# Patient Record
Sex: Female | Born: 1955
Health system: Southern US, Community
[De-identification: ages and names within clinical notes are randomized; demographics above are authoritative.]

## PROBLEM LIST (undated history)

## (undated) DIAGNOSIS — Z9109 Other allergy status, other than to drugs and biological substances: Secondary | ICD-10-CM

## (undated) DIAGNOSIS — S86011A Strain of right Achilles tendon, initial encounter: Secondary | ICD-10-CM

## (undated) DIAGNOSIS — I10 Essential (primary) hypertension: Secondary | ICD-10-CM

## (undated) DIAGNOSIS — E785 Hyperlipidemia, unspecified: Secondary | ICD-10-CM

## (undated) DIAGNOSIS — I480 Paroxysmal atrial fibrillation: Secondary | ICD-10-CM

## (undated) DIAGNOSIS — E119 Type 2 diabetes mellitus without complications: Secondary | ICD-10-CM

## (undated) DIAGNOSIS — H409 Unspecified glaucoma: Secondary | ICD-10-CM

## (undated) DIAGNOSIS — Z8619 Personal history of other infectious and parasitic diseases: Secondary | ICD-10-CM

## (undated) DIAGNOSIS — M8580 Other specified disorders of bone density and structure, unspecified site: Secondary | ICD-10-CM

## (undated) DIAGNOSIS — L247 Irritant contact dermatitis due to plants, except food: Secondary | ICD-10-CM

## (undated) HISTORY — PX: CHOLECYSTECTOMY: SHX55

## (undated) HISTORY — DX: Hyperlipidemia, unspecified: E78.5

## (undated) HISTORY — DX: Essential (primary) hypertension: I10

## (undated) HISTORY — DX: Other allergy status, other than to drugs and biological substances: Z91.09

## (undated) HISTORY — DX: Unspecified glaucoma: H40.9

## (undated) HISTORY — PX: ACHILLES TENDON REPAIR: SUR1153

## (undated) HISTORY — DX: Type 2 diabetes mellitus without complications: E11.9

## (undated) HISTORY — DX: Paroxysmal atrial fibrillation: I48.0

## (undated) HISTORY — DX: Personal history of other infectious and parasitic diseases: Z86.19

## (undated) HISTORY — PX: TUBAL LIGATION: SHX77

---

## 1997-04-05 HISTORY — PX: BREAST BIOPSY: SHX20

## 1997-08-11 ENCOUNTER — Emergency Department (HOSPITAL_COMMUNITY): Admission: EM | Admit: 1997-08-11 | Discharge: 1997-08-11 | Payer: Self-pay | Admitting: Emergency Medicine

## 2006-05-06 ENCOUNTER — Ambulatory Visit: Payer: Self-pay | Admitting: Internal Medicine

## 2006-07-21 ENCOUNTER — Ambulatory Visit: Payer: Self-pay

## 2006-07-22 ENCOUNTER — Ambulatory Visit: Payer: Self-pay

## 2006-07-29 ENCOUNTER — Ambulatory Visit: Payer: Self-pay

## 2007-07-27 ENCOUNTER — Ambulatory Visit: Payer: Self-pay | Admitting: Unknown Physician Specialty

## 2007-08-21 ENCOUNTER — Ambulatory Visit: Payer: Self-pay | Admitting: Gastroenterology

## 2007-12-26 ENCOUNTER — Ambulatory Visit: Payer: Self-pay | Admitting: Unknown Physician Specialty

## 2008-01-04 ENCOUNTER — Ambulatory Visit: Payer: Self-pay | Admitting: Unknown Physician Specialty

## 2008-10-02 ENCOUNTER — Ambulatory Visit: Payer: Self-pay | Admitting: Unknown Physician Specialty

## 2009-11-20 ENCOUNTER — Ambulatory Visit: Payer: Self-pay | Admitting: Unknown Physician Specialty

## 2010-11-23 ENCOUNTER — Ambulatory Visit: Payer: Self-pay

## 2011-02-12 ENCOUNTER — Other Ambulatory Visit: Payer: Self-pay | Admitting: Internal Medicine

## 2011-04-13 ENCOUNTER — Ambulatory Visit: Payer: Self-pay | Admitting: Internal Medicine

## 2011-05-07 ENCOUNTER — Ambulatory Visit: Payer: Self-pay | Admitting: Internal Medicine

## 2011-06-04 ENCOUNTER — Ambulatory Visit: Payer: Self-pay | Admitting: Internal Medicine

## 2011-07-23 ENCOUNTER — Other Ambulatory Visit: Payer: Self-pay | Admitting: Internal Medicine

## 2011-07-23 LAB — COMPREHENSIVE METABOLIC PANEL
Alkaline Phosphatase: 72 U/L (ref 50–136)
Anion Gap: 8 (ref 7–16)
Bilirubin,Total: 0.3 mg/dL (ref 0.2–1.0)
Calcium, Total: 8.5 mg/dL (ref 8.5–10.1)
Chloride: 105 mmol/L (ref 98–107)
Co2: 28 mmol/L (ref 21–32)
Creatinine: 0.92 mg/dL (ref 0.60–1.30)
EGFR (Non-African Amer.): >60
Glucose: 101 mg/dL — ABNORMAL HIGH (ref 65–99)
Osmolality: 282 (ref 275–301)
Potassium: 3.8 mmol/L (ref 3.5–5.1)
SGOT(AST): 32 U/L (ref 15–37)
SGPT (ALT): 39 U/L
Sodium: 141 mmol/L (ref 136–145)
Total Protein: 7.4 g/dL (ref 6.4–8.2)

## 2011-07-23 LAB — LIPID PANEL
Cholesterol: 158 mg/dL (ref 0–200)
HDL Cholesterol: 51 mg/dL (ref 40–60)
VLDL Cholesterol, Calc: 18 mg/dL (ref 5–40)

## 2011-10-26 LAB — HM PAP SMEAR

## 2011-11-04 ENCOUNTER — Ambulatory Visit: Payer: Self-pay | Admitting: Internal Medicine

## 2011-11-26 LAB — HM MAMMOGRAPHY

## 2011-11-30 ENCOUNTER — Ambulatory Visit: Payer: Self-pay | Admitting: Internal Medicine

## 2012-02-04 LAB — HM DIABETES EYE EXAM

## 2012-04-27 ENCOUNTER — Encounter: Payer: Self-pay | Admitting: Internal Medicine

## 2012-04-27 ENCOUNTER — Ambulatory Visit (INDEPENDENT_AMBULATORY_CARE_PROVIDER_SITE_OTHER): Payer: 59 | Admitting: Internal Medicine

## 2012-04-27 VITALS — BP 124/78 | HR 88 | Temp 98.3°F | Ht 62.0 in | Wt 191.5 lb

## 2012-04-27 DIAGNOSIS — E7800 Pure hypercholesterolemia, unspecified: Secondary | ICD-10-CM

## 2012-04-27 DIAGNOSIS — Z139 Encounter for screening, unspecified: Secondary | ICD-10-CM

## 2012-04-27 DIAGNOSIS — I10 Essential (primary) hypertension: Secondary | ICD-10-CM

## 2012-04-27 DIAGNOSIS — H409 Unspecified glaucoma: Secondary | ICD-10-CM

## 2012-04-27 MED ORDER — LISINOPRIL 10 MG PO TABS: 10.0000 mg | ORAL_TABLET | Freq: Every day | ORAL | Status: DC

## 2012-04-27 MED ORDER — TRIAMTERENE-HCTZ 37.5-25 MG PO CAPS: 1.0000 | ORAL_CAPSULE | ORAL | Status: DC

## 2012-04-27 MED ORDER — AMLODIPINE BESYLATE 5 MG PO TABS: 5.0000 mg | ORAL_TABLET | Freq: Every day | ORAL | Status: DC

## 2012-04-28 ENCOUNTER — Encounter: Payer: Self-pay | Admitting: Internal Medicine

## 2012-04-28 DIAGNOSIS — I152 Hypertension secondary to endocrine disorders: Secondary | ICD-10-CM | POA: Insufficient documentation

## 2012-04-28 DIAGNOSIS — H409 Unspecified glaucoma: Secondary | ICD-10-CM | POA: Insufficient documentation

## 2012-04-28 DIAGNOSIS — E119 Type 2 diabetes mellitus without complications: Secondary | ICD-10-CM | POA: Insufficient documentation

## 2012-04-28 DIAGNOSIS — I10 Essential (primary) hypertension: Secondary | ICD-10-CM | POA: Insufficient documentation

## 2012-04-28 NOTE — Progress Notes (Signed)
  Subjective:    [name], female    DOB: Oct 21, 1955, 57 y.o.   MRN: [id number]  HPI 57 year old female with past history of hypertension and diabetes who comes in today to follow up on these issues as well as to establish care.  She works at the hospital.  States she is doing well.  No chest pain or tightness.  Not exercising regularly, but she does state she walks one mile on Saturday (with her sister).  Takes metformin.  Checks her sugar regularly.  AM sugars averaging 120-140 and PM sugars 140-150s.  Last a1c 6.2.  Has been to lifestyles.  Still follows up with Raynelle Fanning.  LMP 02/2008.    Past Medical History  Diagnosis Date  . Diabetes mellitus without complication   . Glaucoma   . Environmental allergies   . Hypertension   . History of chicken pox     Current Outpatient Prescriptions on File Prior to Visit  Medication Sig Dispense Refill  . metFORMIN (GLUCOPHAGE) 500 MG tablet Take 500 mg by mouth daily.      Marland Kitchen triamterene-hydrochlorothiazide (DYAZIDE) 37.5-25 MG per capsule Take 1 each (1 capsule total) by mouth every morning.  30 capsule  3  . lisinopril (PRINIVIL,ZESTRIL) 10 MG tablet Take 1 tablet (10 mg total) by mouth daily.  30 tablet  3    Review of Systems Patient denies any headache, lightheadedness or dizziness.  No sinus or allergy symptoms.  No chest pain, tightness or palpitations.  No increased shortness of breath, cough or congestion.  No nausea or vomiting.  No acid reflux.  No abdominal pain or cramping.  No bowel change, such as diarrhea, constipation, BRBPR or melana.  No urine change.  No vaginal problems.  LMP 02/2008.  No spotting or bleeding since.  Plans to get more serious about her exercise.       Objective:   Physical Exam  Blood pressure recheck:  128/78, pulse 70  57 year old female in no acute distress.   HEENT:  Nares- clear.  Oropharynx - without lesions. NECK:  Supple.  Nontender.  No audible bruit.  HEART:  Appears to be  regular. LUNGS:  No crackles or wheezing audible.  Respirations even and unlabored.  RADIAL PULSE:  Equal bilaterally.  ABDOMEN:  Soft, nontender.  Bowel sounds present and normal.  No audible abdominal bruit.   EXTREMITIES:  No increased edema present.  DP pulses palpable and equal bilaterally.       SKIN:  Feet without lesions.      Assessment & Plan:  FAMILY HISTORY OF COLON POLYPS.  Mother has polyps.  Her last colonoscopy 2009.  Due follow up colonoscopy this year.  Make referral.    HEALTH MAINTENANCE.  States she had her physical 7/13.  Mammogram 8/13 - ok.  Due colonoscopy.  Last 2009.

## 2012-04-28 NOTE — Assessment & Plan Note (Signed)
Sees opthalmology.  Stable on current regimen.  Last evaluated 11/13.

## 2012-04-28 NOTE — Assessment & Plan Note (Signed)
On metformin.  Sugars as outlined.  Last a1c 6.2. Continues to follow up with Raynelle Fanning.  Exercise.  Follow.  Start ACE inhibitor as outline.  Eye exam 11/13.

## 2012-04-28 NOTE — Assessment & Plan Note (Signed)
Blood pressure is doing well.  Since she is diabetic, would like to get her on an ACE inhibitor.  Will decrease her amlodipine to 5mg  q day and start lisinopril 10mg  q day.  Follow pressures.  Send in readings.  Get her back in soon to reassess.  Check met b a couple of weeks after she starts the ACE inhibitor.

## 2012-05-21 ENCOUNTER — Encounter: Payer: Self-pay | Admitting: Internal Medicine

## 2012-06-22 ENCOUNTER — Telehealth: Payer: Self-pay | Admitting: Internal Medicine

## 2012-06-22 ENCOUNTER — Other Ambulatory Visit: Payer: Self-pay | Admitting: Internal Medicine

## 2012-06-22 LAB — BASIC METABOLIC PANEL
Anion Gap: 8 (ref 7–16)
BUN: 18 mg/dL (ref 7–18)
Calcium, Total: 8.8 mg/dL (ref 8.5–10.1)
Chloride: 102 mmol/L (ref 98–107)
Co2: 28 mmol/L (ref 21–32)
Creatinine: 0.85 mg/dL (ref 0.60–1.30)
Sodium: 138 mmol/L (ref 136–145)

## 2012-06-22 NOTE — Telephone Encounter (Signed)
noted 

## 2012-06-22 NOTE — Telephone Encounter (Signed)
[facility] labs in box °

## 2012-06-23 ENCOUNTER — Ambulatory Visit: Payer: 59 | Admitting: Internal Medicine

## 2012-06-26 ENCOUNTER — Encounter: Payer: Self-pay | Admitting: Internal Medicine

## 2012-06-26 ENCOUNTER — Ambulatory Visit (INDEPENDENT_AMBULATORY_CARE_PROVIDER_SITE_OTHER): Payer: 59 | Admitting: Internal Medicine

## 2012-06-26 VITALS — BP 102/60 | HR 76 | Temp 98.8°F | Ht 62.0 in | Wt 187.8 lb

## 2012-06-26 DIAGNOSIS — H409 Unspecified glaucoma: Secondary | ICD-10-CM

## 2012-06-26 DIAGNOSIS — E119 Type 2 diabetes mellitus without complications: Secondary | ICD-10-CM

## 2012-06-26 DIAGNOSIS — I10 Essential (primary) hypertension: Secondary | ICD-10-CM

## 2012-06-26 NOTE — Assessment & Plan Note (Signed)
On metformin.  Sugars improved as outlined.  Last a1c 6.2. Continues to follow up with Raynelle Fanning.  Exercise.  Follow.   Eye exam 11/13.

## 2012-06-26 NOTE — Progress Notes (Signed)
  Subjective:    [name], female    DOB: 05/28/1955, 57 y.o.   MRN: [id number]  HPI 57 year old female with past history of hypertension and diabetes who comes in today for a scheduled follow up.  States she is doing well.  No chest pain or tightness.   Takes metformin.  Checks her sugar regularly.  AM sugars averaging in the 90s and PM sugars 120.   Last a1c 6.2.  Has been to lifestyles.  Still follows up with Raynelle Fanning.  She has lost weight.  Feels good.  Last visit, I decreased her amlodipine to 5mg  q day and started lisinopril 10mg  q day.  Recent met b wnl.  Blood pressure averaging 120s/70s.    Past Medical History  Diagnosis Date  . Diabetes mellitus without complication   . Glaucoma   . Environmental allergies   . Hypertension   . History of chicken pox     Current Outpatient Prescriptions on File Prior to Visit  Medication Sig Dispense Refill  . amLODipine (NORVASC) 5 MG tablet Take 1 tablet (5 mg total) by mouth daily.  30 tablet  3  . bimatoprost (LUMIGAN) 0.01 % SOLN 1 drop at bedtime.      Marland Kitchen glucose blood test strip Use as instructed      . lisinopril (PRINIVIL,ZESTRIL) 10 MG tablet Take 1 tablet (10 mg total) by mouth daily.  30 tablet  3  . metFORMIN (GLUCOPHAGE) 500 MG tablet Take 500 mg by mouth daily.      . timolol (BETIMOL) 0.25 % ophthalmic solution 1-2 drops 2 (two) times daily.      Marland Kitchen triamterene-hydrochlorothiazide (DYAZIDE) 37.5-25 MG per capsule Take 1 each (1 capsule total) by mouth every morning.  30 capsule  3   No current facility-administered medications on file prior to visit.    Review of Systems Patient denies any headache, lightheadedness or dizziness.  No sinus or allergy symptoms.  No chest pain, tightness or palpitations.  No increased shortness of breath, cough or congestion.  No nausea or vomiting.  No acid reflux.  No abdominal pain or cramping.  No bowel change, such as diarrhea, constipation, BRBPR or melana.  No urine change.         Objective:   Physical Exam  Filed Vitals:   06/26/12 1637  BP: 102/60  Pulse: 76  Temp: 98.8 F (37.1 C)     Blood pressure recheck:  46/56  57 year old female in no acute distress.   HEENT:  Nares- clear.  Oropharynx - without lesions. NECK:  Supple.  Nontender.  No audible bruit.  HEART:  Appears to be regular. LUNGS:  No crackles or wheezing audible.  Respirations even and unlabored.  RADIAL PULSE:  Equal bilaterally.  ABDOMEN:  Soft, nontender.  Bowel sounds present and normal.  No audible abdominal bruit.   EXTREMITIES:  No increased edema present.      Assessment & Plan:  FAMILY HISTORY OF COLON POLYPS.  Mother has polyps.  Her last colonoscopy 2009.  Due follow up colonoscopy this year.  Has her appt scheduled with Dr Markham Jordan.     HEALTH MAINTENANCE.  States she had her physical 7/13.  Mammogram 8/13 - ok.  Due colonoscopy.  Last 2009.  Schedule her for a physical next visit.

## 2012-06-26 NOTE — Assessment & Plan Note (Signed)
Sees opthalmology.  Stable on current regimen.  Last evaluated 11/13.

## 2012-06-26 NOTE — Assessment & Plan Note (Signed)
Blood pressure is doing well on amlodipine 5mg  q day and lisinopril 10mg  q day.  Recent met b within normal limits.  Follow.

## 2012-07-11 ENCOUNTER — Encounter: Payer: Self-pay | Admitting: Internal Medicine

## 2012-08-18 ENCOUNTER — Ambulatory Visit: Payer: Self-pay | Admitting: Unknown Physician Specialty

## 2012-08-21 LAB — PATHOLOGY REPORT

## 2012-09-07 ENCOUNTER — Other Ambulatory Visit: Payer: Self-pay | Admitting: *Deleted

## 2012-09-07 MED ORDER — LISINOPRIL 10 MG PO TABS: 10.0000 mg | ORAL_TABLET | Freq: Every day | ORAL | Status: DC

## 2012-09-07 MED ORDER — AMLODIPINE BESYLATE 5 MG PO TABS: 5.0000 mg | ORAL_TABLET | Freq: Every day | ORAL | Status: DC

## 2012-09-07 MED ORDER — TRIAMTERENE-HCTZ 37.5-25 MG PO CAPS: 1.0000 | ORAL_CAPSULE | ORAL | Status: DC

## 2012-09-21 ENCOUNTER — Other Ambulatory Visit: Payer: Self-pay | Admitting: *Deleted

## 2012-09-21 MED ORDER — METFORMIN HCL 500 MG PO TABS: 500.0000 mg | ORAL_TABLET | Freq: Every day | ORAL | Status: DC

## 2012-09-22 LAB — HEMOGLOBIN A1C: Hgb A1c MFr Bld: 6.2 % — AB (ref 4.0–6.0)

## 2012-09-29 ENCOUNTER — Telehealth: Payer: Self-pay | Admitting: *Deleted

## 2012-09-29 NOTE — Telephone Encounter (Signed)
Left detailed message (per DPR) to notify pt that her Hemaglobin A1C drawn on 09/22/12 @ [facility] was normal (6.2)

## 2012-10-26 ENCOUNTER — Encounter: Payer: Self-pay | Admitting: Internal Medicine

## 2012-10-26 ENCOUNTER — Ambulatory Visit (INDEPENDENT_AMBULATORY_CARE_PROVIDER_SITE_OTHER): Payer: 59 | Admitting: Internal Medicine

## 2012-10-26 VITALS — BP 110/70 | HR 85 | Temp 98.3°F | Ht 61.75 in | Wt 190.5 lb

## 2012-10-26 DIAGNOSIS — E119 Type 2 diabetes mellitus without complications: Secondary | ICD-10-CM

## 2012-10-26 DIAGNOSIS — I10 Essential (primary) hypertension: Secondary | ICD-10-CM

## 2012-10-26 DIAGNOSIS — H409 Unspecified glaucoma: Secondary | ICD-10-CM

## 2012-10-26 LAB — HM DIABETES FOOT EXAM

## 2012-10-29 ENCOUNTER — Encounter: Payer: Self-pay | Admitting: Internal Medicine

## 2012-10-29 NOTE — Progress Notes (Signed)
  Subjective:    Patient ID: YOSELIN AMERMAN, female    DOB: 01-Aug-1955, 57 y.o.   MRN: [id number]  HPI 57 year old female with past history of hypertension and diabetes who comes in today to follow up on these issues as well as for a complete physical exam.  States she is doing well.  No chest pain or tightness.   Takes metformin.  Checks her sugar regularly.  AM sugars averaging in the 113-120s and PM sugars 130-140.  Last a1c 6.2.  Has been to lifestyles.  Still follows up with Raynelle Fanning.  Feels good.     Past Medical History  Diagnosis Date  . Diabetes mellitus without complication   . Glaucoma   . Environmental allergies   . Hypertension   . History of chicken pox     Current Outpatient Prescriptions on File Prior to Visit  Medication Sig Dispense Refill  . amLODipine (NORVASC) 5 MG tablet Take 1 tablet (5 mg total) by mouth daily.  30 tablet  5  . bimatoprost (LUMIGAN) 0.01 % SOLN 1 drop at bedtime.      Marland Kitchen glucose blood test strip Use as instructed      . lisinopril (PRINIVIL,ZESTRIL) 10 MG tablet Take 1 tablet (10 mg total) by mouth daily.  30 tablet  5  . metFORMIN (GLUCOPHAGE) 500 MG tablet Take 1 tablet (500 mg total) by mouth daily.  30 tablet  5  . timolol (BETIMOL) 0.25 % ophthalmic solution 1-2 drops 2 (two) times daily.      Marland Kitchen triamterene-hydrochlorothiazide (DYAZIDE) 37.5-25 MG per capsule Take 1 each (1 capsule total) by mouth every morning.  30 capsule  5   No current facility-administered medications on file prior to visit.    Review of Systems Patient denies any headache, lightheadedness or dizziness.  No sinus or allergy symptoms.  No chest pain, tightness or palpitations.  No increased shortness of breath, cough or congestion.  No nausea or vomiting.  No acid reflux.  No abdominal pain or cramping.  No bowel change, such as diarrhea, constipation, BRBPR or melana.  No urine change.  Sugars as outlined.       Objective:   Physical Exam  Filed Vitals:   10/26/12  1541  BP: 110/70  Pulse: 85  Temp: 98.3 F (36.8 C)   Blood pressure recheck:  114/72, pulse 77  57 year old female in no acute distress.   HEENT:  Nares- clear.  Oropharynx - without lesions. NECK:  Supple.  Nontender.  No audible bruit.  HEART:  Appears to be regular. LUNGS:  No crackles or wheezing audible.  Respirations even and unlabored.  RADIAL PULSE:  Equal bilaterally.    BREASTS:  No nipple discharge or nipple retraction present.  Could not appreciate any distinct nodules or axillary adenopathy.  ABDOMEN:  Soft, nontender.  Bowel sounds present and normal.  No audible abdominal bruit.  GU:  She declined.     EXTREMITIES:  No increased edema present.  DP pulses palpable and equal bilaterally.      FEET:  No lesions.       Assessment & Plan:  FAMILY HISTORY OF COLON POLYPS.  Mother has polyps.  Her last colonoscopy 08/18/12 revealed a polyp in teh cecum, diverticulosis and internal hemorrhoids.   Due follow up colonoscopy 2019.      HEALTH MAINTENANCE.  Physical today.  Mammogram 8/13 - ok. Scheduled a f/u mammogram in 8/14.

## 2012-10-29 NOTE — Assessment & Plan Note (Signed)
On metformin.  Sugars improved as outlined.  Last a1c 6.2. Continues to follow up with Raynelle Fanning.  Exercise.  Follow.   Eye exam 11/13.

## 2012-10-29 NOTE — Assessment & Plan Note (Signed)
Blood pressure is doing well on amlodipine 5mg  q day and lisinopril 10mg  q day.  Recent met b within normal limits.  Follow.

## 2012-10-29 NOTE — Assessment & Plan Note (Signed)
Sees opthalmology.  Stable on current regimen.  Last evaluated 11/13.

## 2012-11-07 ENCOUNTER — Ambulatory Visit: Payer: Self-pay | Admitting: Internal Medicine

## 2012-11-08 ENCOUNTER — Encounter: Payer: Self-pay | Admitting: *Deleted

## 2012-11-16 ENCOUNTER — Encounter: Payer: Self-pay | Admitting: *Deleted

## 2012-11-20 ENCOUNTER — Encounter: Payer: Self-pay | Admitting: Internal Medicine

## 2012-11-20 ENCOUNTER — Telehealth: Payer: Self-pay | Admitting: Internal Medicine

## 2012-11-20 MED ORDER — GLUCOSE BLOOD VI STRP: ORAL_STRIP | Status: DC

## 2012-11-20 NOTE — Telephone Encounter (Signed)
done

## 2012-11-20 NOTE — Telephone Encounter (Signed)
Pt is needing refill on Contour next strips pt uses [facility] pharmacy.

## 2012-12-26 ENCOUNTER — Other Ambulatory Visit: Payer: Self-pay | Admitting: Internal Medicine

## 2012-12-26 LAB — CBC WITH DIFFERENTIAL/PLATELET
Basophil #: 0.1 10*3/uL (ref 0.0–0.1)
Basophil %: 1 %
Eosinophil #: 0.2 10*3/uL (ref 0.0–0.7)
Eosinophil %: 3.2 %
HCT: 40.9 % (ref 35.0–47.0)
Lymphocyte #: 2.4 10*3/uL (ref 1.0–3.6)
Lymphocyte %: 36.8 %
MCHC: 34.9 g/dL (ref 32.0–36.0)
MCV: 89 fL (ref 80–100)
Monocyte #: 0.7 x10 3/mm (ref 0.2–0.9)
Neutrophil %: 49 %
Platelet: 246 10*3/uL (ref 150–440)
RBC: 4.59 10*6/uL (ref 3.80–5.20)
RDW: 13.4 % (ref 11.5–14.5)
WBC: 6.5 10*3/uL (ref 3.6–11.0)

## 2012-12-26 LAB — LIPID PANEL
Cholesterol: 150 mg/dL (ref 0–200)
Ldl Cholesterol, Calc: 84 mg/dL (ref 0–100)
Triglycerides: 132 mg/dL (ref 0–200)
VLDL Cholesterol, Calc: 26 mg/dL (ref 5–40)

## 2012-12-26 LAB — COMPREHENSIVE METABOLIC PANEL
Alkaline Phosphatase: 100 U/L (ref 50–136)
BUN: 14 mg/dL (ref 7–18)
Calcium, Total: 8.6 mg/dL (ref 8.5–10.1)
Chloride: 108 mmol/L — ABNORMAL HIGH (ref 98–107)
Co2: 26 mmol/L (ref 21–32)
Creatinine: 1.06 mg/dL (ref 0.60–1.30)
Glucose: 128 mg/dL — ABNORMAL HIGH (ref 65–99)
Osmolality: 280 (ref 275–301)
Potassium: 3.9 mmol/L (ref 3.5–5.1)
SGPT (ALT): 41 U/L (ref 12–78)
Sodium: 139 mmol/L (ref 136–145)
Total Protein: 7.2 g/dL (ref 6.4–8.2)

## 2012-12-26 LAB — TSH: Thyroid Stimulating Horm: 0.531 u[IU]/mL

## 2013-02-06 ENCOUNTER — Encounter: Payer: Self-pay | Admitting: Internal Medicine

## 2013-03-02 ENCOUNTER — Encounter: Payer: Self-pay | Admitting: Internal Medicine

## 2013-03-02 ENCOUNTER — Encounter (INDEPENDENT_AMBULATORY_CARE_PROVIDER_SITE_OTHER): Payer: Self-pay

## 2013-03-02 ENCOUNTER — Ambulatory Visit (INDEPENDENT_AMBULATORY_CARE_PROVIDER_SITE_OTHER): Payer: 59 | Admitting: Internal Medicine

## 2013-03-02 VITALS — BP 110/70 | HR 76 | Temp 98.3°F | Ht 61.75 in | Wt 192.8 lb

## 2013-03-02 DIAGNOSIS — I10 Essential (primary) hypertension: Secondary | ICD-10-CM

## 2013-03-02 DIAGNOSIS — H409 Unspecified glaucoma: Secondary | ICD-10-CM

## 2013-03-02 DIAGNOSIS — E119 Type 2 diabetes mellitus without complications: Secondary | ICD-10-CM

## 2013-03-02 MED ORDER — METFORMIN HCL 500 MG PO TABS: 500.0000 mg | ORAL_TABLET | Freq: Every day | ORAL | Status: DC

## 2013-03-02 NOTE — Assessment & Plan Note (Addendum)
Sees opthalmology.  Stable on current regimen.  Last evaluated 11/13.

## 2013-03-02 NOTE — Assessment & Plan Note (Addendum)
On metformin.  Sugars improved as outlined.  Last a1c 6.3. Continues to follow up with Raynelle Fanning.  Exercise.  Follow.   Eye exam 11/13.

## 2013-03-02 NOTE — Assessment & Plan Note (Addendum)
Blood pressure is doing well on amlodipine 5mg q day and lisinopril 10mg q day.  Last met b within normal limits.  Follow.   

## 2013-03-02 NOTE — Progress Notes (Signed)
Pre-visit discussion using our clinic review tool. No additional management support is needed unless otherwise documented below in the visit note.  

## 2013-03-04 ENCOUNTER — Encounter: Payer: Self-pay | Admitting: Internal Medicine

## 2013-03-04 ENCOUNTER — Telehealth: Payer: Self-pay | Admitting: Internal Medicine

## 2013-03-04 NOTE — Telephone Encounter (Signed)
Needs a f/u appt in 4 months.  Needs late pm appt.  Thanks.

## 2013-03-04 NOTE — Progress Notes (Signed)
  Subjective:    [name], female    DOB: Feb 29, 1956, 57 y.o.   MRN: [id number]  HPI 57 year old female with past history of hypertension and diabetes who comes in today for a scheduled follow up.  States she is doing well.  No chest pain or tightness.   Takes metformin.  Checks her sugar regularly.  AM sugars averaging in the 111-120s and PM sugars 130-140.  Last a1c 6.2.  Has been to lifestyles.  Still follows up with Raynelle Fanning.  Feels good.  trying to watch what she eats.      Past Medical History  Diagnosis Date  . Diabetes mellitus without complication   . Glaucoma   . Environmental allergies   . Hypertension   . History of chicken pox     Current Outpatient Prescriptions on File Prior to Visit  Medication Sig Dispense Refill  . amLODipine (NORVASC) 5 MG tablet Take 1 tablet (5 mg total) by mouth daily.  30 tablet  5  . bimatoprost (LUMIGAN) 0.01 % SOLN 1 drop at bedtime.      Marland Kitchen glucose blood test strip Use as instructed  100 each  11  . lisinopril (PRINIVIL,ZESTRIL) 10 MG tablet Take 1 tablet (10 mg total) by mouth daily.  30 tablet  5  . timolol (BETIMOL) 0.25 % ophthalmic solution 1-2 drops 2 (two) times daily.      Marland Kitchen triamterene-hydrochlorothiazide (DYAZIDE) 37.5-25 MG per capsule Take 1 each (1 capsule total) by mouth every morning.  30 capsule  5   No current facility-administered medications on file prior to visit.    Review of Systems Patient denies any headache, lightheadedness or dizziness.  No sinus or allergy symptoms.  No chest pain, tightness or palpitations.  No increased shortness of breath, cough or congestion.  No nausea or vomiting.  No acid reflux.  No abdominal pain or cramping.  No bowel change, such as diarrhea, constipation, BRBPR or melana.  No urine change.  Sugars as outlined.       Objective:   Physical Exam  Filed Vitals:   03/02/13 1533  BP: 110/70  Pulse: 76  Temp: 98.3 F (36.8 C)   Blood pressure recheck:  120/74, pulse  57  57 year old female in no acute distress.   HEENT:  Nares- clear.  Oropharynx - without lesions. NECK:  Supple.  Nontender.  No audible bruit.  HEART:  Appears to be regular. LUNGS:  No crackles or wheezing audible.  Respirations even and unlabored.  RADIAL PULSE:  Equal bilaterally. ABDOMEN:  Soft, nontender.  Bowel sounds present and normal.  No audible abdominal bruit.     EXTREMITIES:  No increased edema present.  DP pulses palpable and equal bilaterally.      FEET:  No lesions.       Assessment & Plan:  FAMILY HISTORY OF COLON POLYPS.  Mother has polyps.  Her last colonoscopy 08/18/12 revealed a polyp in the cecum, diverticulosis and internal hemorrhoids.   Due follow up colonoscopy 2019.      HEALTH MAINTENANCE.  Physical last visit.  Mammogram 11/07/12 - Birads II.  Colonoscopy as outlined.

## 2013-03-05 NOTE — Telephone Encounter (Signed)
Left message for pt to call office

## 2013-03-05 NOTE — Telephone Encounter (Signed)
Pt returned call.  Pt wanted to make 6 month appt since she is doing well.  Appt scheduled 09/03/13.

## 2013-03-19 ENCOUNTER — Other Ambulatory Visit: Payer: Self-pay | Admitting: Internal Medicine

## 2013-03-19 LAB — BASIC METABOLIC PANEL
Anion Gap: 4 — ABNORMAL LOW (ref 7–16)
BUN: 16 mg/dL (ref 7–18)
Calcium, Total: 8.8 mg/dL (ref 8.5–10.1)
Co2: 29 mmol/L (ref 21–32)
Creatinine: 0.87 mg/dL (ref 0.60–1.30)
EGFR (Non-African Amer.): >60
Potassium: 4 mmol/L (ref 3.5–5.1)

## 2013-03-27 ENCOUNTER — Encounter: Payer: Self-pay | Admitting: *Deleted

## 2013-03-30 NOTE — Telephone Encounter (Signed)
Mailed unread message to pt  

## 2013-05-09 ENCOUNTER — Encounter: Payer: Self-pay | Admitting: Internal Medicine

## 2013-06-18 ENCOUNTER — Ambulatory Visit: Payer: Self-pay | Admitting: Internal Medicine

## 2013-07-04 ENCOUNTER — Ambulatory Visit: Payer: Self-pay | Admitting: Internal Medicine

## 2013-08-02 ENCOUNTER — Other Ambulatory Visit: Payer: Self-pay | Admitting: *Deleted

## 2013-08-02 MED ORDER — METFORMIN HCL 500 MG PO TABS: 500.0000 mg | ORAL_TABLET | Freq: Every day | ORAL | Status: DC

## 2013-08-31 ENCOUNTER — Other Ambulatory Visit: Payer: Self-pay | Admitting: Internal Medicine

## 2013-08-31 NOTE — Telephone Encounter (Signed)
Appt 09/03/13

## 2013-09-03 ENCOUNTER — Encounter: Payer: Self-pay | Admitting: Internal Medicine

## 2013-09-03 ENCOUNTER — Ambulatory Visit (INDEPENDENT_AMBULATORY_CARE_PROVIDER_SITE_OTHER): Payer: BC Managed Care – PPO | Admitting: Internal Medicine

## 2013-09-03 VITALS — BP 110/70 | HR 73 | Temp 98.6°F | Ht 61.75 in | Wt 181.5 lb

## 2013-09-03 DIAGNOSIS — E669 Obesity, unspecified: Secondary | ICD-10-CM

## 2013-09-03 DIAGNOSIS — Z13220 Encounter for screening for lipoid disorders: Secondary | ICD-10-CM

## 2013-09-03 DIAGNOSIS — H409 Unspecified glaucoma: Secondary | ICD-10-CM

## 2013-09-03 DIAGNOSIS — E119 Type 2 diabetes mellitus without complications: Secondary | ICD-10-CM

## 2013-09-03 DIAGNOSIS — I10 Essential (primary) hypertension: Secondary | ICD-10-CM

## 2013-09-03 NOTE — Progress Notes (Signed)
Pre visit review using our clinic review tool, if applicable. No additional management support is needed unless otherwise documented below in the visit note. 

## 2013-09-09 ENCOUNTER — Encounter: Payer: Self-pay | Admitting: Internal Medicine

## 2013-09-09 DIAGNOSIS — E669 Obesity, unspecified: Secondary | ICD-10-CM | POA: Insufficient documentation

## 2013-09-09 NOTE — Assessment & Plan Note (Signed)
On metformin.  Sugars doing well as outlined.  Last a1c 6.2. Continues to follow up with Almyra Free.  Exercise.  Follow.   Eye exam planned as outlined.

## 2013-09-09 NOTE — Assessment & Plan Note (Signed)
Sees opthalmology.  Stable on current regimen.  Due to f/u with Dr Dorthula Nettles 10/29/13.

## 2013-09-09 NOTE — Assessment & Plan Note (Signed)
BMI is 32.  Pt working on diet and exercise.  Follow.

## 2013-09-09 NOTE — Assessment & Plan Note (Signed)
Blood pressure is doing well on amlodipine 5mg q day and lisinopril 10mg q day.  Last met b within normal limits.  Follow.   

## 2013-09-09 NOTE — Progress Notes (Signed)
  Subjective:    [name], female    DOB: 04/18/1955, 58 y.o.   MRN: [id number]  HPI 58 year old female with past history of hypertension and diabetes who comes in today for a scheduled follow up.  States she is doing well.  No chest pain or tightness.   Takes metformin.  Checks her sugar regularly.  AM sugars averaging in the 103-119.   Has been to lifestyles.  Still follows up with Almyra Free.  Feels good.  Trying to watch what she eats.  Has retired.  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  Planning to f/u with Dr Dorthula Nettles at [facility] 10/29/13.  Has glaucoma.  Is followed regularly.  Last a1c 3/15 - 6.2.      Past Medical History  Diagnosis Date  . Diabetes mellitus without complication   . Glaucoma   . Environmental allergies   . Hypertension   . History of chicken pox     Current Outpatient Prescriptions on File Prior to Visit  Medication Sig Dispense Refill  . amLODipine (NORVASC) 5 MG tablet Take 1 tablet by mouth daily.  90 tablet  1  . bimatoprost (LUMIGAN) 0.01 % SOLN 1 drop at bedtime.      Marland Kitchen glucose blood test strip Use as instructed  100 each  11  . lisinopril (PRINIVIL,ZESTRIL) 10 MG tablet Take 1 tablet by mouth daily.  90 tablet  1  . metFORMIN (GLUCOPHAGE) 500 MG tablet TAKE ONE TABLET BY MOUTH EVERY DAY  30 tablet  0  . timolol (BETIMOL) 0.25 % ophthalmic solution 1-2 drops 2 (two) times daily.      Marland Kitchen triamterene-hydrochlorothiazide (DYAZIDE) 37.5-25 MG per capsule TAKE ONE CAPSULE BY MOUTH EVERY MORNING  90 capsule  1   No current facility-administered medications on file prior to visit.    Review of Systems Patient denies any headache, lightheadedness or dizziness.  No sinus or allergy symptoms.  No chest pain, tightness or palpitations.  No increased shortness of breath, cough or congestion.  No nausea or vomiting.  No acid reflux.  No abdominal pain or cramping.  No bowel change, such as diarrhea, constipation, BRBPR or melana.  No urine change.   Sugars as outlined.  Has retired.  Stress better.       Objective:   Physical Exam  Filed Vitals:   09/03/13 1604  BP: 110/70  Pulse: 73  Temp: 98.6 F (37 C)   Blood pressure recheck:  9/74  58 year old female in no acute distress.   HEENT:  Nares- clear.  Oropharynx - without lesions. NECK:  Supple.  Nontender.  No audible bruit.  HEART:  Appears to be regular. LUNGS:  No crackles or wheezing audible.  Respirations even and unlabored.  RADIAL PULSE:  Equal bilaterally. ABDOMEN:  Soft, nontender.  Bowel sounds present and normal.  No audible abdominal bruit.     EXTREMITIES:  No increased edema present.  DP pulses palpable and equal bilaterally.      FEET:  No lesions.       Assessment & Plan:  FAMILY HISTORY OF COLON POLYPS.  Mother has polyps.  Her last colonoscopy 08/18/12 revealed a polyp in the cecum, diverticulosis and internal hemorrhoids.   Due follow up colonoscopy 2019.      HEALTH MAINTENANCE.  Physical 10/26/12.  Mammogram 11/07/12 - Birads II.  Colonoscopy as outlined.  Due f/u 2019.

## 2013-10-02 ENCOUNTER — Other Ambulatory Visit: Payer: Self-pay | Admitting: Internal Medicine

## 2013-10-02 ENCOUNTER — Other Ambulatory Visit (INDEPENDENT_AMBULATORY_CARE_PROVIDER_SITE_OTHER): Payer: BC Managed Care – PPO

## 2013-10-02 ENCOUNTER — Other Ambulatory Visit: Payer: Self-pay | Admitting: *Deleted

## 2013-10-02 DIAGNOSIS — Z13220 Encounter for screening for lipoid disorders: Secondary | ICD-10-CM

## 2013-10-02 DIAGNOSIS — E119 Type 2 diabetes mellitus without complications: Secondary | ICD-10-CM

## 2013-10-02 LAB — CBC WITH DIFFERENTIAL/PLATELET
Basophils Absolute: 0 10*3/uL (ref 0.0–0.1)
Basophils Relative: 0.6 % (ref 0.0–3.0)
EOS PCT: 2.9 % (ref 0.0–5.0)
Eosinophils Absolute: 0.2 10*3/uL (ref 0.0–0.7)
HEMATOCRIT: 39.5 % (ref 36.0–46.0)
Hemoglobin: 13.5 g/dL (ref 12.0–15.0)
LYMPHS ABS: 2.2 10*3/uL (ref 0.7–4.0)
Lymphocytes Relative: 37 % (ref 12.0–46.0)
MCHC: 34.2 g/dL (ref 30.0–36.0)
MCV: 90.3 fl (ref 78.0–100.0)
MONO ABS: 0.6 10*3/uL (ref 0.1–1.0)
Monocytes Relative: 10 % (ref 3.0–12.0)
Neutro Abs: 2.9 10*3/uL (ref 1.4–7.7)
Neutrophils Relative %: 49.5 % (ref 43.0–77.0)
Platelets: 221 10*3/uL (ref 150.0–400.0)
RBC: 4.38 Mil/uL (ref 3.87–5.11)
RDW: 13.4 % (ref 11.5–15.5)
WBC: 5.9 10*3/uL (ref 4.0–10.5)

## 2013-10-02 LAB — LIPID PANEL
CHOL/HDL RATIO: 4
Cholesterol: 173 mg/dL (ref 0–200)
HDL: 39.5 mg/dL (ref >39.00)
LDL CALC: 93 mg/dL (ref 0–99)
NONHDL: 133.5
Triglycerides: 201 mg/dL — ABNORMAL HIGH (ref 0.0–149.0)
VLDL: 40.2 mg/dL — ABNORMAL HIGH (ref 0.0–40.0)

## 2013-10-02 LAB — COMPREHENSIVE METABOLIC PANEL
ALK PHOS: 63 U/L (ref 39–117)
ALT: 27 U/L (ref 0–35)
AST: 25 U/L (ref 0–37)
Albumin: 4.1 g/dL (ref 3.5–5.2)
BILIRUBIN TOTAL: 0.9 mg/dL (ref 0.2–1.2)
BUN: 16 mg/dL (ref 6–23)
CO2: 28 mEq/L (ref 19–32)
Calcium: 9 mg/dL (ref 8.4–10.5)
Chloride: 104 mEq/L (ref 96–112)
Creatinine, Ser: 0.7 mg/dL (ref 0.4–1.2)
GFR: 85.69 mL/min (ref >60.00)
Glucose, Bld: 124 mg/dL — ABNORMAL HIGH (ref 70–99)
Potassium: 4 mEq/L (ref 3.5–5.1)
SODIUM: 139 meq/L (ref 135–145)
TOTAL PROTEIN: 6.7 g/dL (ref 6.0–8.3)

## 2013-10-02 LAB — TSH: TSH: 0.45 u[IU]/mL (ref 0.35–4.50)

## 2013-10-02 LAB — MICROALBUMIN / CREATININE URINE RATIO
Creatinine,U: 189.9 mg/dL
Microalb Creat Ratio: 0.7 mg/g (ref 0.0–30.0)
Microalb, Ur: 1.4 mg/dL (ref 0.0–1.9)

## 2013-10-02 LAB — HEMOGLOBIN A1C: Hgb A1c MFr Bld: 6.1 % (ref 4.6–6.5)

## 2013-10-02 MED ORDER — AMLODIPINE BESYLATE 5 MG PO TABS: ORAL_TABLET | ORAL | Status: DC

## 2013-10-02 MED ORDER — TRIAMTERENE-HCTZ 37.5-25 MG PO CAPS: ORAL_CAPSULE | ORAL | Status: DC

## 2013-10-02 MED ORDER — LISINOPRIL 10 MG PO TABS: ORAL_TABLET | ORAL | Status: DC

## 2013-10-03 ENCOUNTER — Encounter: Payer: Self-pay | Admitting: Internal Medicine

## 2013-10-08 NOTE — Telephone Encounter (Signed)
Unread mychart message mailed to patient 

## 2013-11-15 ENCOUNTER — Ambulatory Visit: Payer: Self-pay | Admitting: Internal Medicine

## 2013-11-15 ENCOUNTER — Encounter: Payer: Self-pay | Admitting: *Deleted

## 2013-11-15 LAB — HM MAMMOGRAPHY: HM Mammogram: NEGATIVE

## 2013-12-03 ENCOUNTER — Encounter: Payer: Self-pay | Admitting: Internal Medicine

## 2013-12-06 ENCOUNTER — Ambulatory Visit (INDEPENDENT_AMBULATORY_CARE_PROVIDER_SITE_OTHER): Payer: BC Managed Care – PPO | Admitting: Internal Medicine

## 2013-12-06 ENCOUNTER — Encounter: Payer: Self-pay | Admitting: Internal Medicine

## 2013-12-06 ENCOUNTER — Other Ambulatory Visit (HOSPITAL_COMMUNITY)
Admission: RE | Admit: 2013-12-06 | Discharge: 2013-12-06 | Disposition: A | Discharge status: Home / Self Care | Payer: BC Managed Care – PPO | Source: Ambulatory Visit | Attending: Internal Medicine | Admitting: Internal Medicine

## 2013-12-06 VITALS — BP 100/70 | HR 74 | Temp 99.1°F | Ht 62.0 in | Wt 181.8 lb

## 2013-12-06 DIAGNOSIS — E669 Obesity, unspecified: Secondary | ICD-10-CM

## 2013-12-06 DIAGNOSIS — Z1151 Encounter for screening for human papillomavirus (HPV): Secondary | ICD-10-CM | POA: Diagnosis present

## 2013-12-06 DIAGNOSIS — I10 Essential (primary) hypertension: Secondary | ICD-10-CM

## 2013-12-06 DIAGNOSIS — Z124 Encounter for screening for malignant neoplasm of cervix: Secondary | ICD-10-CM

## 2013-12-06 DIAGNOSIS — Z01419 Encounter for gynecological examination (general) (routine) without abnormal findings: Secondary | ICD-10-CM | POA: Diagnosis not present

## 2013-12-06 DIAGNOSIS — H409 Unspecified glaucoma: Secondary | ICD-10-CM

## 2013-12-06 DIAGNOSIS — E119 Type 2 diabetes mellitus without complications: Secondary | ICD-10-CM

## 2013-12-06 NOTE — Progress Notes (Signed)
Subjective:    [name], female    DOB: [birth year], 58 y.o.   MRN: [id number]  HPI 58 year old female with past history of hypertension and diabetes who comes in today to follow up on these issues as well as for a complete physical exam.  States she is doing well.  No chest pain or tightness.   Takes metformin.  Checks her sugar regularly.  AM sugars averaging in the 110-120s.   Has been to lifestyles.  Still follows up with Almyra Free.  Feels good.  Trying to watch what she eats.  Has retired.  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  Increased stress related to this.  Feels she is handling things relatively well.  Saw Dr Dorthula Nettles at [facility] 7/15.   Has glaucoma.  Is followed regularly.  Last a1c 10/02/13 - 6.1.  Overall feels she is doing well.      Past Medical History  Diagnosis Date  . Diabetes mellitus without complication   . Glaucoma   . Environmental allergies   . Hypertension   . History of chicken pox     Current Outpatient Prescriptions on File Prior to Visit  Medication Sig Dispense Refill  . amLODipine (NORVASC) 5 MG tablet Take 1 tablet by mouth daily.  90 tablet  1  . bimatoprost (LUMIGAN) 0.01 % SOLN 1 drop at bedtime.      Marland Kitchen glucose blood test strip Use as instructed  100 each  11  . lisinopril (PRINIVIL,ZESTRIL) 10 MG tablet Take 1 tablet by mouth daily.  90 tablet  1  . metFORMIN (GLUCOPHAGE) 500 MG tablet TAKE ONE TABLET EVERY DAY  30 tablet  6  . timolol (BETIMOL) 0.25 % ophthalmic solution 1-2 drops 2 (two) times daily.      Marland Kitchen triamterene-hydrochlorothiazide (DYAZIDE) 37.5-25 MG per capsule TAKE ONE CAPSULE BY MOUTH EVERY MORNING  90 capsule  1   No current facility-administered medications on file prior to visit.    Review of Systems Patient denies any headache, lightheadedness or dizziness.  No sinus or allergy symptoms.  No chest pain, tightness or palpitations.  No increased shortness of breath, cough or congestion.  No nausea or  vomiting.  No acid reflux.  No abdominal pain or cramping.  No bowel change, such as diarrhea, constipation, BRBPR or melana.  No urine change.  Sugars as outlined and see attached list.   Has retired.  Increased stress better.  Just had eye exam.       Objective:   Physical Exam  Filed Vitals:   12/06/13 0912  BP: 100/70  Pulse: 74  Temp: 99.1 F (37.3 C)   Blood pressure recheck:  63/88  58 year old female in no acute distress.   HEENT:  Nares- clear.  Oropharynx - without lesions. NECK:  Supple.  Nontender.  No audible bruit.  HEART:  Appears to be regular. LUNGS:  No crackles or wheezing audible.  Respirations even and unlabored.  RADIAL PULSE:  Equal bilaterally.    BREASTS:  No nipple discharge or nipple retraction present.  Could not appreciate any distinct nodules or axillary adenopathy.  ABDOMEN:  Soft, nontender.  Bowel sounds present and normal.  No audible abdominal bruit.  GU:  Normal external genitalia.  Vaginal vault without lesions.  Cervix identified.  Pap performed. Could not appreciate any adnexal masses or tenderness.   RECTAL:  Heme negative.   EXTREMITIES:  No increased edema present.  DP pulses palpable  and equal bilaterally.    SKIN:  No lesions.       Assessment & Plan:  FAMILY HISTORY OF COLON POLYPS.  Mother has polyps.  Her last colonoscopy 08/18/12 revealed a polyp in the cecum, diverticulosis and internal hemorrhoids.   Due follow up colonoscopy 2019.      HEALTH MAINTENANCE.  Physical today.  Mammogram 11/15/13  - ok.  Schedule f/u mammogram.  Colonoscopy as outlined.  Due f/u 2019.

## 2013-12-06 NOTE — Progress Notes (Signed)
Pre visit review using our clinic review tool, if applicable. No additional management support is needed unless otherwise documented below in the visit note. 

## 2013-12-10 ENCOUNTER — Encounter: Payer: Self-pay | Admitting: Internal Medicine

## 2013-12-10 NOTE — Assessment & Plan Note (Signed)
Sees opthalmology.  Stable on current regimen.  Had f/u with Dr Dorthula Nettles 7/15.

## 2013-12-10 NOTE — Assessment & Plan Note (Signed)
On metformin.  Sugars doing well as outlined.  Last a1c 6.1 (10/02/13). Continues to follow up with Almyra Free.  Exercise.  Follow.   Eye exam as outlined.

## 2013-12-10 NOTE — Assessment & Plan Note (Signed)
BMI is 33.  Pt working on diet and exercise.  Follow.

## 2013-12-10 NOTE — Assessment & Plan Note (Signed)
Blood pressure is doing well on amlodipine 5mg q day and lisinopril 10mg q day.  Last met b within normal limits.  Follow.   

## 2013-12-11 ENCOUNTER — Encounter: Payer: Self-pay | Admitting: Internal Medicine

## 2013-12-11 LAB — CYTOLOGY - PAP

## 2013-12-13 NOTE — Telephone Encounter (Signed)
Unread mychart message mailed to patient 

## 2014-01-07 ENCOUNTER — Other Ambulatory Visit: Payer: Self-pay | Admitting: Internal Medicine

## 2014-03-25 ENCOUNTER — Other Ambulatory Visit: Payer: Self-pay | Admitting: *Deleted

## 2014-03-25 MED ORDER — GLUCOSE BLOOD VI STRP: ORAL_STRIP | Status: DC

## 2014-04-08 ENCOUNTER — Other Ambulatory Visit (INDEPENDENT_AMBULATORY_CARE_PROVIDER_SITE_OTHER): Payer: No Typology Code available for payment source

## 2014-04-08 DIAGNOSIS — E119 Type 2 diabetes mellitus without complications: Secondary | ICD-10-CM

## 2014-04-08 LAB — COMPREHENSIVE METABOLIC PANEL
ALK PHOS: 66 U/L (ref 39–117)
ALT: 27 U/L (ref 0–35)
AST: 26 U/L (ref 0–37)
Albumin: 4.3 g/dL (ref 3.5–5.2)
BILIRUBIN TOTAL: 0.4 mg/dL (ref 0.2–1.2)
BUN: 14 mg/dL (ref 6–23)
CO2: 27 mEq/L (ref 19–32)
Calcium: 9 mg/dL (ref 8.4–10.5)
Chloride: 107 mEq/L (ref 96–112)
Creatinine, Ser: 0.8 mg/dL (ref 0.4–1.2)
GFR: 79.32 mL/min (ref >60.00)
GLUCOSE: 114 mg/dL — AB (ref 70–99)
Potassium: 4 mEq/L (ref 3.5–5.1)
SODIUM: 140 meq/L (ref 135–145)
Total Protein: 7.3 g/dL (ref 6.0–8.3)

## 2014-04-08 LAB — LIPID PANEL
CHOL/HDL RATIO: 5
CHOLESTEROL: 193 mg/dL (ref 0–200)
HDL: 40.7 mg/dL (ref >39.00)
NONHDL: 152.3
Triglycerides: 204 mg/dL — ABNORMAL HIGH (ref 0.0–149.0)
VLDL: 40.8 mg/dL — ABNORMAL HIGH (ref 0.0–40.0)

## 2014-04-08 LAB — LDL CHOLESTEROL, DIRECT: Direct LDL: 116.5 mg/dL

## 2014-04-08 LAB — HEMOGLOBIN A1C: Hgb A1c MFr Bld: 6.3 % (ref 4.6–6.5)

## 2014-04-09 ENCOUNTER — Encounter: Payer: Self-pay | Admitting: Internal Medicine

## 2014-04-09 ENCOUNTER — Ambulatory Visit (INDEPENDENT_AMBULATORY_CARE_PROVIDER_SITE_OTHER): Payer: No Typology Code available for payment source | Admitting: Internal Medicine

## 2014-04-09 VITALS — BP 120/70 | HR 67 | Temp 98.0°F | Ht 62.0 in | Wt 183.8 lb

## 2014-04-09 DIAGNOSIS — E669 Obesity, unspecified: Secondary | ICD-10-CM

## 2014-04-09 DIAGNOSIS — E119 Type 2 diabetes mellitus without complications: Secondary | ICD-10-CM

## 2014-04-09 DIAGNOSIS — H409 Unspecified glaucoma: Secondary | ICD-10-CM

## 2014-04-09 DIAGNOSIS — F439 Reaction to severe stress, unspecified: Secondary | ICD-10-CM | POA: Insufficient documentation

## 2014-04-09 DIAGNOSIS — I10 Essential (primary) hypertension: Secondary | ICD-10-CM

## 2014-04-09 DIAGNOSIS — Z658 Other specified problems related to psychosocial circumstances: Secondary | ICD-10-CM

## 2014-04-09 NOTE — Progress Notes (Signed)
Subjective:    [name], female    DOB: January 16, 1956, 59 y.o.   MRN: [id number]  HPI 59 year old female with past history of hypertension and diabetes who comes in today for a scheduled follow up.  States she is doing well.  No chest pain or tightness.  Takes metformin.  Checks her sugar.  Brought in no recorded sugar readings.  AM sugars averaging in the 118-120.   Has been to lifestyles.  Still follows up with [facility].  Trying to watch what she eats.  Has retired. 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  Increased stress related to this and also to her husband's health issues.   Feels she is handling things relatively well.  Sees Dr Dorthula Nettles at [facility].   Has glaucoma.  Is followed regularly.  a1c just checked - 6.3.      Past Medical History  Diagnosis Date  . Diabetes mellitus without complication   . Glaucoma   . Environmental allergies   . Hypertension   . History of chicken pox     Current Outpatient Prescriptions on File Prior to Visit  Medication Sig Dispense Refill  . amLODipine (NORVASC) 5 MG tablet TAKE ONE TABLET EVERY DAY 90 tablet 2  . bimatoprost (LUMIGAN) 0.01 % SOLN 1 drop at bedtime.    Marland Kitchen glucose blood test strip Use as instructed 100 each 5  . lisinopril (PRINIVIL,ZESTRIL) 10 MG tablet TAKE ONE TABLET EVERY DAY 90 tablet 2  . metFORMIN (GLUCOPHAGE) 500 MG tablet TAKE ONE TABLET EVERY DAY 30 tablet 6  . timolol (BETIMOL) 0.25 % ophthalmic solution 1-2 drops 2 (two) times daily.    Marland Kitchen triamterene-hydrochlorothiazide (DYAZIDE) 37.5-25 MG per capsule TAKE 1 CAPSULE EVERY MORNING 90 capsule 2   No current facility-administered medications on file prior to visit.    Review of Systems Patient denies any headache, lightheadedness or dizziness.  No sinus or allergy symptoms.  No chest pain, tightness or palpitations. No increased shortness of breath, cough or congestion.  No nausea or vomiting.  No acid reflux.  No abdominal pain or cramping.  No bowel  change, such as diarrhea, constipation, BRBPR or melana.  No urine change.  Sugars as outlined.   Has retired.  Taking care of her parents.  Increased stress with this and with her husband's health issues.       Objective:   Physical Exam  Filed Vitals:   04/09/14 0806  BP: 120/70  Pulse: 67  Temp: 98 F (36.7 C)   Blood pressure recheck:  62/54  59 year old female in no acute distress.   HEENT:  Nares- clear.  Oropharynx - without lesions. NECK:  Supple.  Nontender.  No audible bruit.  HEART:  Appears to be regular. LUNGS:  No crackles or wheezing audible.  Respirations even and unlabored.  RADIAL PULSE:  Equal bilaterally. ABDOMEN:  Soft, nontender.  Bowel sounds present and normal.  No audible abdominal bruit.   EXTREMITIES:  No increased edema present.  DP pulses palpable and equal bilaterally.    FEET:  No lesions.  See simple diabetic foot form.       Assessment & Plan:  1. Essential hypertension Blood pressure doing well.  Same medication regimen.  Follow pressures.  - Basic metabolic panel; Future  2. Type 2 diabetes mellitus without complication Low carb diet and exercise.  On metformin.  Follow sugars.   - Lipid panel; Future - Hepatic function panel; Future -  Hemoglobin A1c; Future Lab Results  Component Value Date   HGBA1C 6.3 04/08/2014   3. Glaucoma Sees Dr Dorthula Nettles.  Up to date with eye exams.    4. Obesity (BMI 30-39.9) Diet and exercise.    5. Stress Increased stress as outlined.  She feels she is doing well.  Follow.    6. FAMILY HISTORY OF COLON POLYPS.  Mother has polyps.  Her last colonoscopy 08/18/12 revealed a polyp in the cecum, diverticulosis and internal hemorrhoids.   Due follow up colonoscopy 2019.      HEALTH MAINTENANCE.  Physical 12/06/13.  Mammogram 11/15/13  - ok.  Colonoscopy as outlined.  Due f/u 2019.

## 2014-04-09 NOTE — Progress Notes (Signed)
Pre visit review using our clinic review tool, if applicable. No additional management support is needed unless otherwise documented below in the visit note. 

## 2014-04-10 NOTE — Telephone Encounter (Signed)
Unread mychart message mailed to patient 

## 2014-05-03 ENCOUNTER — Other Ambulatory Visit: Payer: Self-pay | Admitting: Internal Medicine

## 2014-08-06 ENCOUNTER — Other Ambulatory Visit (INDEPENDENT_AMBULATORY_CARE_PROVIDER_SITE_OTHER): Payer: No Typology Code available for payment source

## 2014-08-06 DIAGNOSIS — E119 Type 2 diabetes mellitus without complications: Secondary | ICD-10-CM | POA: Diagnosis not present

## 2014-08-06 DIAGNOSIS — I10 Essential (primary) hypertension: Secondary | ICD-10-CM | POA: Diagnosis not present

## 2014-08-06 LAB — HEMOGLOBIN A1C: HEMOGLOBIN A1C: 6 % (ref 4.6–6.5)

## 2014-08-06 LAB — LIPID PANEL
CHOL/HDL RATIO: 4
CHOLESTEROL: 162 mg/dL (ref 0–200)
HDL: 40.8 mg/dL (ref >39.00)
LDL CALC: 81 mg/dL (ref 0–99)
NonHDL: 121.2
TRIGLYCERIDES: 199 mg/dL — AB (ref 0.0–149.0)
VLDL: 39.8 mg/dL (ref 0.0–40.0)

## 2014-08-06 LAB — BASIC METABOLIC PANEL
BUN: 22 mg/dL (ref 6–23)
CO2: 28 mEq/L (ref 19–32)
Calcium: 9.3 mg/dL (ref 8.4–10.5)
Chloride: 103 mEq/L (ref 96–112)
Creatinine, Ser: 0.88 mg/dL (ref 0.40–1.20)
GFR: 69.96 mL/min (ref >60.00)
Glucose, Bld: 119 mg/dL — ABNORMAL HIGH (ref 70–99)
Potassium: 4.2 mEq/L (ref 3.5–5.1)
Sodium: 137 mEq/L (ref 135–145)

## 2014-08-06 LAB — HEPATIC FUNCTION PANEL
ALK PHOS: 68 U/L (ref 39–117)
ALT: 24 U/L (ref 0–35)
AST: 20 U/L (ref 0–37)
Albumin: 4 g/dL (ref 3.5–5.2)
BILIRUBIN DIRECT: 0.1 mg/dL (ref 0.0–0.3)
BILIRUBIN TOTAL: 0.5 mg/dL (ref 0.2–1.2)
TOTAL PROTEIN: 7.1 g/dL (ref 6.0–8.3)

## 2014-08-07 ENCOUNTER — Encounter: Payer: Self-pay | Admitting: Internal Medicine

## 2014-08-08 ENCOUNTER — Ambulatory Visit (INDEPENDENT_AMBULATORY_CARE_PROVIDER_SITE_OTHER): Payer: No Typology Code available for payment source | Admitting: Internal Medicine

## 2014-08-08 ENCOUNTER — Encounter: Payer: Self-pay | Admitting: Internal Medicine

## 2014-08-08 VITALS — BP 104/66 | HR 65 | Temp 97.9°F | Ht 62.0 in | Wt 183.4 lb

## 2014-08-08 DIAGNOSIS — E119 Type 2 diabetes mellitus without complications: Secondary | ICD-10-CM

## 2014-08-08 DIAGNOSIS — Z658 Other specified problems related to psychosocial circumstances: Secondary | ICD-10-CM

## 2014-08-08 DIAGNOSIS — H409 Unspecified glaucoma: Secondary | ICD-10-CM

## 2014-08-08 DIAGNOSIS — Z0000 Encounter for general adult medical examination without abnormal findings: Secondary | ICD-10-CM

## 2014-08-08 DIAGNOSIS — I10 Essential (primary) hypertension: Secondary | ICD-10-CM

## 2014-08-08 DIAGNOSIS — E669 Obesity, unspecified: Secondary | ICD-10-CM

## 2014-08-08 DIAGNOSIS — F439 Reaction to severe stress, unspecified: Secondary | ICD-10-CM

## 2014-08-08 NOTE — Progress Notes (Signed)
Pre visit review using our clinic review tool, if applicable. No additional management support is needed unless otherwise documented below in the visit note. 

## 2014-08-08 NOTE — Progress Notes (Signed)
[name], female   DOB: 05-29-1955, 59 y.o.   MRN: [id number]   Subjective:    [name], female    DOB: 25-Feb-1956, 59 y.o.   MRN: [id number]  HPI  Patient here for a scheduled follow up.  Doing better.  Stress better.  Has been trying to watch her diet.  Has been eating slight increased amount of red meat.  Tries to stay active.  No cardiac symptoms reported with increased activity or exertion.  No sob.  Bowels stable.     Past Medical History  Diagnosis Date  . Diabetes mellitus without complication   . Glaucoma   . Environmental allergies   . Hypertension   . History of chicken pox     Current Outpatient Prescriptions on File Prior to Visit  Medication Sig Dispense Refill  . amLODipine (NORVASC) 5 MG tablet TAKE ONE TABLET EVERY DAY 90 tablet 2  . glucose blood test strip Use as instructed 100 each 5  . lisinopril (PRINIVIL,ZESTRIL) 10 MG tablet TAKE ONE TABLET EVERY DAY 90 tablet 2  . metFORMIN (GLUCOPHAGE) 500 MG tablet TAKE 1 TABLET EVERY DAY 30 tablet 5  . timolol (BETIMOL) 0.25 % ophthalmic solution 1-2 drops 2 (two) times daily.    Marland Kitchen triamterene-hydrochlorothiazide (DYAZIDE) 37.5-25 MG per capsule TAKE 1 CAPSULE EVERY MORNING 90 capsule 2   No current facility-administered medications on file prior to visit.    Review of Systems  Constitutional: Negative for appetite change and unexpected weight change.  HENT: Negative for sinus pressure.   Eyes:       Sees Dr Dorthula Nettles for her glaucoma.   Respiratory: Negative for cough, chest tightness and shortness of breath.   Cardiovascular: Negative for chest pain, palpitations and leg swelling.  Gastrointestinal: Negative for nausea, vomiting, abdominal pain and diarrhea.  Neurological: Negative for dizziness, light-headedness and headaches.  Psychiatric/Behavioral: Negative for decreased concentration and agitation.       Objective:     Blood pressure recheck:   112/70  Physical Exam  Constitutional: She appears well-developed and well-nourished. No distress.  HENT:  Nose: Nose normal.  Mouth/Throat: Oropharynx is clear and moist.  Neck: Neck supple. No thyromegaly present.  Cardiovascular: Normal rate and regular rhythm.   Pulmonary/Chest: Breath sounds normal. No respiratory distress. She has no wheezes.  Abdominal: Soft. Bowel sounds are normal. There is no tenderness.  Musculoskeletal: She exhibits no edema or tenderness.  Lymphadenopathy:    She has no cervical adenopathy.  Skin: No rash noted. No erythema.  Psychiatric: She has a normal mood and affect. Her behavior is normal.    BP 104/66 mmHg  Pulse 65  Temp(Src) 97.9 F (36.6 C) (Oral)  Ht 5\' 2"  (1.575 m)  Wt 183 lb 6 oz (83.178 kg)  BMI 33.53 kg/m2  SpO2 98%  LMP 02/26/2008 Wt Readings from Last 3 Encounters:  08/08/14 183 lb 6 oz (83.178 kg)  04/09/14 183 lb 12 oz (83.348 kg)  12/06/13 181 lb 12 oz (82.441 kg)     Lab Results  Component Value Date   WBC 5.9 10/02/2013   HGB 13.5 10/02/2013   HCT 39.5 10/02/2013   PLT 221.0 10/02/2013   GLUCOSE 119* 08/06/2014   CHOL 162 08/06/2014   TRIG 199.0* 08/06/2014   HDL 40.80 08/06/2014   LDLDIRECT 116.5 04/08/2014   LDLCALC 81 08/06/2014   ALT 24 08/06/2014   AST 20 08/06/2014   NA 137 08/06/2014   K 4.2  08/06/2014   CL 103 08/06/2014   CREATININE 0.88 08/06/2014   BUN 22 08/06/2014   CO2 28 08/06/2014   TSH 0.45 10/02/2013   HGBA1C 6.0 08/06/2014   MICROALBUR 1.4 10/02/2013       Assessment & Plan:   Problem List Items Addressed This Visit    Diabetes mellitus    Sugars have been doing well.  Continue low carb diet.  Follow.  Stays active.   Lab Results  Component Value Date   HGBA1C 6.0 08/06/2014        Relevant Orders   Lipid panel   Hepatic function panel   Hemoglobin I5P   Basic metabolic panel   Microalbumin / creatinine urine ratio   Glaucoma    Seeing Dr Dorthula Nettles.  Stable.         Relevant Medications   latanoprost (XALATAN) 0.005 % ophthalmic solution   dorzolamide-timolol (COSOPT) 22.3-6.8 MG/ML ophthalmic solution   Health care maintenance    Physical 12/06/13.  Colonoscopy 08/18/12 - diverticulosis and internal hemorrhoids.  cecla polyp.  Recommended f/u in 2019.  Mammogram 11/15/13.        Hypertension - Primary    Blood pressure doing well.  Same medication regimen.  Follow pressures.  Follow metabolic panel.        Obesity (BMI 30-39.9)    Diet and exercise.  Follow.       Stress    Stress is better.  Feels she is coping relatively well.  Follow.        Relevant Orders   CBC with Differential/Platelet   TSH       Einar Pheasant, MD

## 2014-08-11 ENCOUNTER — Encounter: Payer: Self-pay | Admitting: Internal Medicine

## 2014-08-11 DIAGNOSIS — Z0000 Encounter for general adult medical examination without abnormal findings: Secondary | ICD-10-CM | POA: Insufficient documentation

## 2014-08-11 NOTE — Assessment & Plan Note (Signed)
Stress is better.  Feels she is coping relatively well.  Follow.

## 2014-08-11 NOTE — Assessment & Plan Note (Signed)
Seeing Dr Dorthula Nettles.  Stable.

## 2014-08-11 NOTE — Assessment & Plan Note (Signed)
Diet and exercise.  Follow.  

## 2014-08-11 NOTE — Assessment & Plan Note (Signed)
Blood pressure doing well.  Same medication regimen.  Follow pressures.  Follow metabolic panel.   

## 2014-08-11 NOTE — Assessment & Plan Note (Signed)
Sugars have been doing well.  Continue low carb diet.  Follow.  Stays active.   Lab Results  Component Value Date   HGBA1C 6.0 08/06/2014

## 2014-08-11 NOTE — Assessment & Plan Note (Signed)
Physical 12/06/13.  Colonoscopy 08/18/12 - diverticulosis and internal hemorrhoids.  cecla polyp.  Recommended f/u in 2019.  Mammogram 11/15/13.

## 2014-09-30 ENCOUNTER — Other Ambulatory Visit: Payer: Self-pay

## 2014-10-23 ENCOUNTER — Other Ambulatory Visit: Payer: Self-pay | Admitting: Internal Medicine

## 2014-11-04 ENCOUNTER — Other Ambulatory Visit: Payer: Self-pay | Admitting: Internal Medicine

## 2014-11-22 ENCOUNTER — Telehealth: Payer: Self-pay | Admitting: Internal Medicine

## 2014-11-22 NOTE — Telephone Encounter (Signed)
I have forwarded to Dr. Nicki Reaper for approval.  I will let you know when I get a response, it may be early next week due to her being out of the office.

## 2014-11-22 NOTE — Telephone Encounter (Signed)
Pt called wanting to know if her son can be seen by Dr Nicki Reaper? I did advise that Dr. Nicki Reaper is not accepting new pt at this time. But I know with some families that are already a pt it can be accepted. I wanted to check. Thank You!

## 2014-11-24 NOTE — Telephone Encounter (Signed)
Ok to schedule.  Just let him know - may be out (to confirm no acute issues).  Thanks.

## 2014-11-25 NOTE — Telephone Encounter (Signed)
Go ahead and scheulde per Dr. Bary Leriche request.  Explain that it maybe a few weeks out.  Thanks

## 2014-11-27 ENCOUNTER — Other Ambulatory Visit: Payer: Self-pay | Admitting: Internal Medicine

## 2014-11-27 DIAGNOSIS — Z1231 Encounter for screening mammogram for malignant neoplasm of breast: Secondary | ICD-10-CM

## 2014-12-03 ENCOUNTER — Ambulatory Visit
Admission: RE | Admit: 2014-12-03 | Discharge: 2014-12-03 | Disposition: A | Discharge status: Home / Self Care | Payer: No Typology Code available for payment source | Source: Ambulatory Visit | Attending: Internal Medicine | Admitting: Internal Medicine

## 2014-12-03 DIAGNOSIS — Z1231 Encounter for screening mammogram for malignant neoplasm of breast: Secondary | ICD-10-CM | POA: Insufficient documentation

## 2014-12-16 ENCOUNTER — Encounter: Payer: Self-pay | Admitting: Internal Medicine

## 2014-12-16 ENCOUNTER — Ambulatory Visit (INDEPENDENT_AMBULATORY_CARE_PROVIDER_SITE_OTHER): Payer: No Typology Code available for payment source | Admitting: Internal Medicine

## 2014-12-16 VITALS — BP 110/70 | HR 60 | Temp 98.2°F | Ht 61.5 in | Wt 185.4 lb

## 2014-12-16 DIAGNOSIS — F439 Reaction to severe stress, unspecified: Secondary | ICD-10-CM

## 2014-12-16 DIAGNOSIS — Z658 Other specified problems related to psychosocial circumstances: Secondary | ICD-10-CM

## 2014-12-16 DIAGNOSIS — E119 Type 2 diabetes mellitus without complications: Secondary | ICD-10-CM | POA: Diagnosis not present

## 2014-12-16 DIAGNOSIS — I10 Essential (primary) hypertension: Secondary | ICD-10-CM | POA: Diagnosis not present

## 2014-12-16 DIAGNOSIS — Z0000 Encounter for general adult medical examination without abnormal findings: Secondary | ICD-10-CM

## 2014-12-16 DIAGNOSIS — E669 Obesity, unspecified: Secondary | ICD-10-CM | POA: Diagnosis not present

## 2014-12-16 MED ORDER — TRIAMCINOLONE ACETONIDE 0.1 % EX CREA: 1.0000 "application " | TOPICAL_CREAM | Freq: Every day | CUTANEOUS | Status: DC | PRN

## 2014-12-16 NOTE — Progress Notes (Signed)
Pre-visit discussion using our clinic review tool. No additional management support is needed unless otherwise documented below in the visit note.  

## 2014-12-16 NOTE — Progress Notes (Signed)
[name], female   DOB: 1955-09-03, 59 y.o.   MRN: [id number]   Subjective:    [name], female    DOB: [birth year], 59 y.o.   MRN: [id number]  HPI  Patient here to follow up on her current medical issues as well as for her complete physical exam.  She is doing well.  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  Tries to stay active.  No cardiac symptoms with increased activity or exertion.  No sob.  No acid reflux reported.  Bowels stable.  Handling stress well.  Feels - mother is getting worse.  Dealing with her medical issues.  Request refill for TCC .1% cream.  Uses sparingly.  Was prescribed by gyn for lichen sclerosis.  Blood sugars in the am averaging 100-115.     Past Medical History  Diagnosis Date  . Diabetes mellitus without complication   . Glaucoma   . Environmental allergies   . Hypertension   . History of chicken pox    Past Surgical History  Procedure Laterality Date  . Breast biopsy Right 1999    neg   Family History  Problem Relation Age of Onset  . Heart disease Father   . Hypertension Father   . Diabetes Father   . Diabetes Sister     x2  . Colon polyps Mother   . Breast cancer Neg Hx   . Colon cancer Neg Hx    Social History   Social History  . Marital Status: Married    Spouse Name: N/A  . Number of Children: 3  . Years of Education: N/A   Occupational History  .     Social History Main Topics  . Smoking status: Never Smoker   . Smokeless tobacco: Never Used  . Alcohol Use: No  . Drug Use: No  . Sexual Activity: Not Asked   Other Topics Concern  . None   Social History Narrative    Outpatient Encounter Prescriptions as of 12/16/2014  Medication Sig  . amLODipine (NORVASC) 5 MG tablet TAKE ONE TABLET BY MOUTH EVERY DAY  . dorzolamide-timolol (COSOPT) 22.3-6.8 MG/ML ophthalmic solution   . glucose blood test strip Use as instructed  . latanoprost (XALATAN) 0.005  % ophthalmic solution   . lisinopril (PRINIVIL,ZESTRIL) 10 MG tablet TAKE ONE TABLET BY MOUTH EVERY DAY  . metFORMIN (GLUCOPHAGE) 500 MG tablet TAKE ONE TABLET BY MOUTH EVERY DAY  . timolol (BETIMOL) 0.25 % ophthalmic solution 1-2 drops 2 (two) times daily.  Marland Kitchen triamterene-hydrochlorothiazide (DYAZIDE) 37.5-25 MG per capsule TAKE 1 CAPSULE BY MOUTH EVERY MORNING  . triamcinolone cream (KENALOG) 0.1 % Apply 1 application topically daily as needed.   No facility-administered encounter medications on file as of 12/16/2014.    Review of Systems  Constitutional: Negative for appetite change and unexpected weight change.  HENT: Negative for congestion, sinus pressure and sore throat.   Eyes: Negative for pain and visual disturbance.  Respiratory: Negative for cough, chest tightness and shortness of breath.   Cardiovascular: Negative for chest pain, palpitations and leg swelling.  Gastrointestinal: Negative for nausea, vomiting, abdominal pain and diarrhea.  Genitourinary: Negative for dysuria and difficulty urinating.  Musculoskeletal: Negative for back pain and joint swelling.  Skin: Negative for color change and rash.  Neurological: Negative for dizziness, light-headedness and headaches.  Hematological: Negative for adenopathy. Does not bruise/bleed easily.  Psychiatric/Behavioral: Negative for dysphoric mood and agitation.  Objective:     blood pressure rechecked by me:  118/78  Physical Exam  Constitutional: She is oriented to person, place, and time. She appears well-developed and well-nourished.  HENT:  Nose: Nose normal.  Mouth/Throat: Oropharynx is clear and moist.  Eyes: Right eye exhibits no discharge. Left eye exhibits no discharge. No scleral icterus.  Neck: Neck supple. No thyromegaly present.  Cardiovascular: Normal rate and regular rhythm.   Pulmonary/Chest: Breath sounds normal. No accessory muscle usage. No tachypnea. No respiratory distress. She has no decreased  breath sounds. She has no wheezes. She has no rhonchi. Right breast exhibits no inverted nipple, no mass, no nipple discharge and no tenderness (no axillary adenopathy). Left breast exhibits no inverted nipple, no mass, no nipple discharge and no tenderness (no axilarry adenopathy).  Abdominal: Soft. Bowel sounds are normal. There is no tenderness.  Genitourinary:  Not performed.   Musculoskeletal: She exhibits no edema or tenderness.  Lymphadenopathy:    She has no cervical adenopathy.  Neurological: She is alert and oriented to person, place, and time.  Skin: Skin is warm. No rash noted. No erythema.  Psychiatric: She has a normal mood and affect. Her behavior is normal.    BP 110/70 mmHg  Pulse 60  Temp(Src) 98.2 F (36.8 C) (Oral)  Ht 5' 1.5" (1.562 m)  Wt 185 lb 6 oz (84.086 kg)  BMI 34.46 kg/m2  SpO2 97%  LMP 02/26/2008 Wt Readings from Last 3 Encounters:  12/16/14 185 lb 6 oz (84.086 kg)  08/08/14 183 lb 6 oz (83.178 kg)  04/09/14 183 lb 12 oz (83.348 kg)     Lab Results  Component Value Date   WBC 5.9 10/02/2013   HGB 13.5 10/02/2013   HCT 39.5 10/02/2013   PLT 221.0 10/02/2013   GLUCOSE 119* 08/06/2014   CHOL 162 08/06/2014   TRIG 199.0* 08/06/2014   HDL 40.80 08/06/2014   LDLDIRECT 116.5 04/08/2014   LDLCALC 81 08/06/2014   ALT 24 08/06/2014   AST 20 08/06/2014   NA 137 08/06/2014   K 4.2 08/06/2014   CL 103 08/06/2014   CREATININE 0.88 08/06/2014   BUN 22 08/06/2014   CO2 28 08/06/2014   TSH 0.45 10/02/2013   HGBA1C 6.0 08/06/2014   MICROALBUR 1.4 10/02/2013    Mm Digital Screening Bilateral  12/04/2014   CLINICAL DATA:  Screening.  EXAM: DIGITAL SCREENING BILATERAL MAMMOGRAM WITH CAD  COMPARISON:  Previous exam(s).  ACR Breast Density Category c: The breast tissue is heterogeneously dense, which may obscure small masses.  FINDINGS: There are no findings suspicious for malignancy. Images were processed with CAD.  IMPRESSION: No mammographic evidence of  malignancy. A result letter of this screening mammogram will be mailed directly to the patient.  RECOMMENDATION: Screening mammogram in one year. (Code:SM-B-01Y)  BI-RADS CATEGORY  1: Negative.   Electronically Signed   By: Lillia Mountain M.D.   On: 12/04/2014 08:52       Assessment & Plan:   Problem List Items Addressed This Visit    Diabetes mellitus    Sugars have been doing well.  No recorded sugar readings.  Low carb diet.  Follow sugars.  Follow met b and a1c.  Eye exam scheduled for next week. Lab Results  Component Value Date   HGBA1C 6.0 08/06/2014        Health care maintenance    Physical today 12/17/14.  Mammogram 12/04/14 - Birads I.  Colonoscopy 08/18/12.  Recommended f/u colonoscopy 08/2017.  PAP  12/06/13 - negative with negative HPV.       Hypertension - Primary    Blood pressure under good control.  Continue same medication regimen.  Follow pressures.  Follow metabolic panel.        Obesity (BMI 30-39.9)    Diet and exercise.  Follow.       Stress    Feels she is handling stress relatively well.  Does not feel she needs any further intervention.  Follow.           Einar Pheasant, MD

## 2014-12-22 ENCOUNTER — Encounter: Payer: Self-pay | Admitting: Internal Medicine

## 2014-12-22 NOTE — Assessment & Plan Note (Signed)
Blood pressure under good control.  Continue same medication regimen.  Follow pressures.  Follow metabolic panel.   

## 2014-12-22 NOTE — Assessment & Plan Note (Addendum)
Sugars have been doing well.  No recorded sugar readings.  Low carb diet.  Follow sugars.  Follow met b and a1c.  Eye exam scheduled for next week. Lab Results  Component Value Date   HGBA1C 6.0 08/06/2014

## 2014-12-22 NOTE — Assessment & Plan Note (Signed)
Physical today 12/17/14.  Mammogram 12/04/14 - Birads I.  Colonoscopy 08/18/12.  Recommended f/u colonoscopy 08/2017.  PAP 12/06/13 - negative with negative HPV.

## 2014-12-22 NOTE — Assessment & Plan Note (Signed)
Feels she is handling stress relatively well.  Does not feel she needs any further intervention.  Follow.

## 2014-12-22 NOTE — Assessment & Plan Note (Signed)
Diet and exercise.  Follow.  

## 2014-12-23 ENCOUNTER — Other Ambulatory Visit (INDEPENDENT_AMBULATORY_CARE_PROVIDER_SITE_OTHER): Payer: No Typology Code available for payment source

## 2014-12-23 ENCOUNTER — Encounter: Payer: Self-pay | Admitting: Internal Medicine

## 2014-12-23 DIAGNOSIS — F439 Reaction to severe stress, unspecified: Secondary | ICD-10-CM

## 2014-12-23 DIAGNOSIS — Z658 Other specified problems related to psychosocial circumstances: Secondary | ICD-10-CM

## 2014-12-23 DIAGNOSIS — E119 Type 2 diabetes mellitus without complications: Secondary | ICD-10-CM

## 2014-12-23 LAB — CBC WITH DIFFERENTIAL/PLATELET
BASOS ABS: 0 10*3/uL (ref 0.0–0.1)
BASOS PCT: 0.8 % (ref 0.0–3.0)
EOS ABS: 0.2 10*3/uL (ref 0.0–0.7)
Eosinophils Relative: 3.2 % (ref 0.0–5.0)
HCT: 42.2 % (ref 36.0–46.0)
Hemoglobin: 14.3 g/dL (ref 12.0–15.0)
LYMPHS ABS: 2.1 10*3/uL (ref 0.7–4.0)
Lymphocytes Relative: 37.4 % (ref 12.0–46.0)
MCHC: 33.8 g/dL (ref 30.0–36.0)
MCV: 91.1 fl (ref 78.0–100.0)
MONO ABS: 0.6 10*3/uL (ref 0.1–1.0)
Monocytes Relative: 10.8 % (ref 3.0–12.0)
NEUTROS ABS: 2.7 10*3/uL (ref 1.4–7.7)
NEUTROS PCT: 47.8 % (ref 43.0–77.0)
PLATELETS: 215 10*3/uL (ref 150.0–400.0)
RBC: 4.63 Mil/uL (ref 3.87–5.11)
RDW: 13.3 % (ref 11.5–15.5)
WBC: 5.7 10*3/uL (ref 4.0–10.5)

## 2014-12-23 LAB — HEPATIC FUNCTION PANEL
ALK PHOS: 66 U/L (ref 39–117)
ALT: 28 U/L (ref 0–35)
AST: 22 U/L (ref 0–37)
Albumin: 4.2 g/dL (ref 3.5–5.2)
BILIRUBIN DIRECT: 0.1 mg/dL (ref 0.0–0.3)
BILIRUBIN TOTAL: 0.5 mg/dL (ref 0.2–1.2)
TOTAL PROTEIN: 7.2 g/dL (ref 6.0–8.3)

## 2014-12-23 LAB — LIPID PANEL
CHOLESTEROL: 169 mg/dL (ref 0–200)
HDL: 44.7 mg/dL (ref >39.00)
LDL Cholesterol: 95 mg/dL (ref 0–99)
NonHDL: 124.21
TRIGLYCERIDES: 144 mg/dL (ref 0.0–149.0)
Total CHOL/HDL Ratio: 4
VLDL: 28.8 mg/dL (ref 0.0–40.0)

## 2014-12-23 LAB — BASIC METABOLIC PANEL
BUN: 16 mg/dL (ref 6–23)
CALCIUM: 9.2 mg/dL (ref 8.4–10.5)
CO2: 25 meq/L (ref 19–32)
Chloride: 105 mEq/L (ref 96–112)
Creatinine, Ser: 0.8 mg/dL (ref 0.40–1.20)
GFR: 77.99 mL/min (ref >60.00)
GLUCOSE: 125 mg/dL — AB (ref 70–99)
Potassium: 4 mEq/L (ref 3.5–5.1)
SODIUM: 139 meq/L (ref 135–145)

## 2014-12-23 LAB — MICROALBUMIN / CREATININE URINE RATIO
Creatinine,U: 124.2 mg/dL
MICROALB/CREAT RATIO: 1.3 mg/g (ref 0.0–30.0)
Microalb, Ur: 1.6 mg/dL (ref 0.0–1.9)

## 2014-12-23 LAB — TSH: TSH: 0.59 u[IU]/mL (ref 0.35–4.50)

## 2014-12-23 LAB — HEMOGLOBIN A1C: Hgb A1c MFr Bld: 6 % (ref 4.6–6.5)

## 2014-12-24 LAB — HM DIABETES EYE EXAM

## 2014-12-25 ENCOUNTER — Encounter: Payer: Self-pay | Admitting: Internal Medicine

## 2014-12-26 NOTE — Telephone Encounter (Signed)
Unread mychart message mailed to patient 

## 2015-01-03 ENCOUNTER — Other Ambulatory Visit: Payer: Self-pay | Admitting: Internal Medicine

## 2015-03-07 ENCOUNTER — Other Ambulatory Visit: Payer: Self-pay | Admitting: Internal Medicine

## 2015-04-24 ENCOUNTER — Ambulatory Visit: Payer: No Typology Code available for payment source | Admitting: Internal Medicine

## 2015-05-07 ENCOUNTER — Other Ambulatory Visit: Payer: Self-pay | Admitting: Internal Medicine

## 2015-06-09 ENCOUNTER — Other Ambulatory Visit: Payer: Self-pay | Admitting: Internal Medicine

## 2015-06-27 ENCOUNTER — Ambulatory Visit: Payer: No Typology Code available for payment source | Admitting: Internal Medicine

## 2015-07-30 ENCOUNTER — Ambulatory Visit (INDEPENDENT_AMBULATORY_CARE_PROVIDER_SITE_OTHER): Payer: BLUE CROSS/BLUE SHIELD | Admitting: Internal Medicine

## 2015-07-30 ENCOUNTER — Encounter: Payer: Self-pay | Admitting: Internal Medicine

## 2015-07-30 VITALS — BP 100/60 | HR 74 | Temp 98.1°F | Resp 18 | Ht 61.5 in | Wt 187.0 lb

## 2015-07-30 DIAGNOSIS — E669 Obesity, unspecified: Secondary | ICD-10-CM

## 2015-07-30 DIAGNOSIS — I10 Essential (primary) hypertension: Secondary | ICD-10-CM | POA: Diagnosis not present

## 2015-07-30 DIAGNOSIS — E119 Type 2 diabetes mellitus without complications: Secondary | ICD-10-CM | POA: Diagnosis not present

## 2015-07-30 DIAGNOSIS — F439 Reaction to severe stress, unspecified: Secondary | ICD-10-CM

## 2015-07-30 DIAGNOSIS — Z658 Other specified problems related to psychosocial circumstances: Secondary | ICD-10-CM

## 2015-07-30 NOTE — Progress Notes (Signed)
[name], female   DOB: Sep 02, 1955, 60 y.o.   MRN: [id number]   Subjective:    [name], female    DOB: November 23, 1955, 60 y.o.   MRN: [id number]  HPI  Patient here for a scheduled follow up.  Increased stress recently with her parents health.  She feels she is coping relatively well.  Discussed with her today.  She tries to stay active.  Discussed diet and exercise.  No chest pain.  No sob.  No abdominal pain or cramping.  Bowels stable. Blood pressure has been doing well.    Past Medical History  Diagnosis Date  . Diabetes mellitus without complication ([place])   . Glaucoma   . Environmental allergies   . Hypertension   . History of chicken pox    Past Surgical History  Procedure Laterality Date  . Breast biopsy Right 1999    neg   Family History  Problem Relation Age of Onset  . Heart disease Father   . Hypertension Father   . Diabetes Father   . Diabetes Sister     x2  . Colon polyps Mother   . Breast cancer Neg Hx   . Colon cancer Neg Hx    Social History   Social History  . Marital Status: Married    Spouse Name: N/A  . Number of Children: 3  . Years of Education: N/A   Occupational History  .     Social History Main Topics  . Smoking status: Never Smoker   . Smokeless tobacco: Never Used  . Alcohol Use: No  . Drug Use: No  . Sexual Activity: Not Asked   Other Topics Concern  . None   Social History Narrative    Outpatient Encounter Prescriptions as of 07/30/2015  Medication Sig  . amLODipine (NORVASC) 5 MG tablet TAKE ONE TABLET BY MOUTH EVERY DAY  . dorzolamide-timolol (COSOPT) 22.3-6.8 MG/ML ophthalmic solution   . glucose blood test strip Use as instructed  . latanoprost (XALATAN) 0.005 % ophthalmic solution   . lisinopril (PRINIVIL,ZESTRIL) 10 MG tablet TAKE ONE TABLET BY MOUTH EVERY DAY  . metFORMIN (GLUCOPHAGE) 500 MG tablet TAKE ONE TABLET BY MOUTH EVERY DAY  . timolol (BETIMOL) 0.25 %  ophthalmic solution 1-2 drops 2 (two) times daily.  Marland Kitchen triamcinolone cream (KENALOG) 0.1 % Apply 1 application topically daily as needed.  . triamterene-hydrochlorothiazide (DYAZIDE) 37.5-25 MG capsule TAKE 1 CAPSULE BY MOUTH EVERY MORNING   No facility-administered encounter medications on file as of 07/30/2015.    Review of Systems  Constitutional: Negative for appetite change and unexpected weight change.  HENT: Negative for congestion and sinus pressure.   Respiratory: Negative for cough, chest tightness and shortness of breath.   Cardiovascular: Negative for chest pain, palpitations and leg swelling.  Gastrointestinal: Negative for nausea, vomiting, abdominal pain and diarrhea.  Genitourinary: Negative for dysuria and difficulty urinating.  Musculoskeletal: Negative for back pain and joint swelling.  Skin: Negative for color change and rash.  Neurological: Negative for dizziness, light-headedness and headaches.  Psychiatric/Behavioral: Negative for dysphoric mood and agitation.       Increased stress as outlined.        Objective:    Physical Exam  Constitutional: She appears well-developed and well-nourished. No distress.  HENT:  Nose: Nose normal.  Mouth/Throat: Oropharynx is clear and moist.  Neck: Neck supple. No thyromegaly present.  Cardiovascular: Normal rate and regular rhythm.   Pulmonary/Chest: Breath sounds normal. No  respiratory distress. She has no wheezes.  Abdominal: Soft. Bowel sounds are normal. There is no tenderness.  Musculoskeletal: She exhibits no edema or tenderness.  Lymphadenopathy:    She has no cervical adenopathy.  Skin: No rash noted. No erythema.  Psychiatric: She has a normal mood and affect. Her behavior is normal.    BP 100/60 mmHg  Pulse 74  Temp(Src) 98.1 F (36.7 C) (Oral)  Resp 18  Ht 5' 1.5" (1.562 m)  Wt 187 lb (84.823 kg)  BMI 34.77 kg/m2  SpO2 95%  LMP 02/26/2008 Wt Readings from Last 3 Encounters:  07/30/15 187 lb (84.823  kg)  12/16/14 185 lb 6 oz (84.086 kg)  08/08/14 183 lb 6 oz (83.178 kg)     Lab Results  Component Value Date   WBC 5.7 12/23/2014   HGB 14.3 12/23/2014   HCT 42.2 12/23/2014   PLT 215.0 12/23/2014   GLUCOSE 125* 12/23/2014   CHOL 169 12/23/2014   TRIG 144.0 12/23/2014   HDL 44.70 12/23/2014   LDLDIRECT 116.5 04/08/2014   LDLCALC 95 12/23/2014   ALT 28 12/23/2014   AST 22 12/23/2014   NA 139 12/23/2014   K 4.0 12/23/2014   CL 105 12/23/2014   CREATININE 0.80 12/23/2014   BUN 16 12/23/2014   CO2 25 12/23/2014   TSH 0.59 12/23/2014   HGBA1C 6.0 12/23/2014   MICROALBUR 1.6 12/23/2014    Mm Digital Screening Bilateral  12/04/2014  CLINICAL DATA:  Screening. EXAM: DIGITAL SCREENING BILATERAL MAMMOGRAM WITH CAD COMPARISON:  Previous exam(s). ACR Breast Density Category c: The breast tissue is heterogeneously dense, which may obscure small masses. FINDINGS: There are no findings suspicious for malignancy. Images were processed with CAD. IMPRESSION: No mammographic evidence of malignancy. A result letter of this screening mammogram will be mailed directly to the patient. RECOMMENDATION: Screening mammogram in one year. (Code:SM-B-01Y) BI-RADS CATEGORY  1: Negative. Electronically Signed   By: Lillia Mountain M.D.   On: 12/04/2014 08:52       Assessment & Plan:   Problem List Items Addressed This Visit    Diabetes mellitus ([place])    Low carb diet and exercise.  Follow met b and a1c.        Hypertension - Primary    Blood pressure under good control.  Continue same medication regimen.  Follow pressures.  Follow metabolic panel.        Obesity (BMI 30-39.9)    Diet and exercise.        Stress    Increased stress.  She feels she is handling things relatively well.  Does not feel needs anything more at this point.  Follow.            Einar Pheasant, MD

## 2015-07-30 NOTE — Progress Notes (Signed)
Pre-visit discussion using our clinic review tool. No additional management support is needed unless otherwise documented below in the visit note.  

## 2015-08-11 ENCOUNTER — Encounter: Payer: Self-pay | Admitting: Internal Medicine

## 2015-08-11 NOTE — Assessment & Plan Note (Signed)
Blood pressure under good control.  Continue same medication regimen.  Follow pressures.  Follow metabolic panel.   

## 2015-08-11 NOTE — Assessment & Plan Note (Signed)
Diet and exercise.   

## 2015-08-11 NOTE — Assessment & Plan Note (Signed)
Increased stress.  She feels she is handling things relatively well.  Does not feel needs anything more at this point.  Follow.

## 2015-08-11 NOTE — Assessment & Plan Note (Signed)
Low carb diet and exercise.  Follow met b and a1c.   

## 2015-10-29 ENCOUNTER — Ambulatory Visit (INDEPENDENT_AMBULATORY_CARE_PROVIDER_SITE_OTHER): Payer: BLUE CROSS/BLUE SHIELD | Admitting: Internal Medicine

## 2015-10-29 ENCOUNTER — Other Ambulatory Visit: Payer: Self-pay | Admitting: Internal Medicine

## 2015-10-29 DIAGNOSIS — B029 Zoster without complications: Secondary | ICD-10-CM

## 2015-10-30 ENCOUNTER — Encounter: Payer: Self-pay | Admitting: Internal Medicine

## 2015-10-30 MED ORDER — VALACYCLOVIR HCL 1 G PO TABS: 1000.0000 mg | ORAL_TABLET | Freq: Three times a day (TID) | ORAL | 0 refills | Status: DC

## 2015-10-30 NOTE — Progress Notes (Signed)
Subjective:    [name], female    DOB: 09-28-1955, 60 y.o.   MRN: [id number]  HPI  Patient here with her husband.  Wants to be checked for possible shingles.  Over the last few days, she has had some pain on her left ear.  Noticed some pain in her left lateral neck.  Now has rash.  Has had shingles previously.  Looks similar.  Increased stress, but otherwise doing ok.     Past Medical History:  Diagnosis Date  . Diabetes mellitus without complication ([place])   . Environmental allergies   . Glaucoma   . History of chicken pox   . Hypertension    Past Surgical History:  Procedure Laterality Date  . BREAST BIOPSY Right 1999   neg   Family History  Problem Relation Age of Onset  . Heart disease Father   . Hypertension Father   . Diabetes Father   . Diabetes Sister     x2  . Colon polyps Mother   . Breast cancer Neg Hx   . Colon cancer Neg Hx    Social History   Social History  . Marital status: Married    Spouse name: N/A  . Number of children: 3  . Years of education: N/A   Occupational History  .  [employer]   Social History Main Topics  . Smoking status: Never Smoker  . Smokeless tobacco: Never Used  . Alcohol use No  . Drug use: No  . Sexual activity: Not Asked   Other Topics Concern  . None   Social History Narrative  . None    Outpatient Encounter Prescriptions as of 10/29/2015  Medication Sig  . amLODipine (NORVASC) 5 MG tablet TAKE ONE TABLET BY MOUTH EVERY DAY  . dorzolamide-timolol (COSOPT) 22.3-6.8 MG/ML ophthalmic solution   . glucose blood test strip Use as instructed  . latanoprost (XALATAN) 0.005 % ophthalmic solution   . lisinopril (PRINIVIL,ZESTRIL) 10 MG tablet TAKE ONE TABLET BY MOUTH EVERY DAY  . metFORMIN (GLUCOPHAGE) 500 MG tablet TAKE ONE TABLET BY MOUTH EVERY DAY  . timolol (BETIMOL) 0.25 % ophthalmic solution 1-2 drops 2 (two) times daily.  Marland Kitchen triamcinolone cream (KENALOG) 0.1 % Apply 1 application topically  daily as needed.  . triamterene-hydrochlorothiazide (DYAZIDE) 37.5-25 MG capsule TAKE 1 CAPSULE BY MOUTH EVERY MORNING  . valACYclovir (VALTREX) 1000 MG tablet Take 1 tablet (1,000 mg total) by mouth 3 (three) times daily.   No facility-administered encounter medications on file as of 10/29/2015.     Review of Systems  Constitutional: Negative for appetite change.  HENT:       Left ear pain.    Eyes: Negative for pain and redness.  Respiratory: Negative for cough and shortness of breath.   Gastrointestinal: Negative for nausea and vomiting.  Skin: Positive for rash.  Psychiatric/Behavioral:       Increased stress as outlined.         Objective:    Physical Exam  Constitutional: She appears well-developed and well-nourished. No distress.  Neck: Neck supple.  Lymphadenopathy:    She has no cervical adenopathy.  Skin:  Erythematous based rash - left posterior neck that extends to her left ear.    Psychiatric: She has a normal mood and affect. Her behavior is normal.    LMP 02/26/2008  Wt Readings from Last 3 Encounters:  07/30/15 187 lb (84.8 kg)  12/16/14 185 lb 6 oz (84.1 kg)  08/08/14 183 lb  6 oz (83.2 kg)     Lab Results  Component Value Date   WBC 5.7 12/23/2014   HGB 14.3 12/23/2014   HCT 42.2 12/23/2014   PLT 215.0 12/23/2014   GLUCOSE 125 (H) 12/23/2014   CHOL 169 12/23/2014   TRIG 144.0 12/23/2014   HDL 44.70 12/23/2014   LDLDIRECT 116.5 04/08/2014   LDLCALC 95 12/23/2014   ALT 28 12/23/2014   AST 22 12/23/2014   NA 139 12/23/2014   K 4.0 12/23/2014   CL 105 12/23/2014   CREATININE 0.80 12/23/2014   BUN 16 12/23/2014   CO2 25 12/23/2014   TSH 0.59 12/23/2014   HGBA1C 6.0 12/23/2014   MICROALBUR 1.6 12/23/2014       Assessment & Plan:   Problem List Items Addressed This Visit    Herpes zoster    Exam and rash as outlined.  Appears to be c/w herpes zoster.  Valtrex as directed.  Refer to ENT.        Relevant Medications   valACYclovir  (VALTREX) 1000 MG tablet    Other Visit Diagnoses   None.      Einar Pheasant, MD

## 2015-10-30 NOTE — Assessment & Plan Note (Signed)
Exam and rash as outlined.  Appears to be c/w herpes zoster.  Valtrex as directed.  Refer to ENT.

## 2015-11-20 ENCOUNTER — Other Ambulatory Visit: Payer: Self-pay | Admitting: Internal Medicine

## 2015-11-20 DIAGNOSIS — Z1231 Encounter for screening mammogram for malignant neoplasm of breast: Secondary | ICD-10-CM

## 2015-11-28 ENCOUNTER — Other Ambulatory Visit: Payer: Self-pay | Admitting: Internal Medicine

## 2015-12-10 ENCOUNTER — Other Ambulatory Visit: Payer: Self-pay | Admitting: Internal Medicine

## 2015-12-10 ENCOUNTER — Ambulatory Visit
Admission: RE | Admit: 2015-12-10 | Discharge: 2015-12-10 | Disposition: A | Discharge status: Home / Self Care | Payer: BLUE CROSS/BLUE SHIELD | Source: Ambulatory Visit | Attending: Internal Medicine | Admitting: Internal Medicine

## 2015-12-10 DIAGNOSIS — Z1231 Encounter for screening mammogram for malignant neoplasm of breast: Secondary | ICD-10-CM

## 2015-12-18 ENCOUNTER — Encounter: Payer: BLUE CROSS/BLUE SHIELD | Admitting: Internal Medicine

## 2015-12-31 LAB — HM DIABETES EYE EXAM

## 2016-01-01 ENCOUNTER — Encounter: Payer: Self-pay | Admitting: Internal Medicine

## 2016-01-10 ENCOUNTER — Other Ambulatory Visit: Payer: Self-pay | Admitting: Internal Medicine

## 2016-02-02 ENCOUNTER — Other Ambulatory Visit: Payer: Self-pay | Admitting: Internal Medicine

## 2016-02-16 ENCOUNTER — Encounter: Payer: BLUE CROSS/BLUE SHIELD | Admitting: Internal Medicine

## 2016-02-17 ENCOUNTER — Encounter: Payer: BLUE CROSS/BLUE SHIELD | Admitting: Internal Medicine

## 2016-03-01 ENCOUNTER — Ambulatory Visit (INDEPENDENT_AMBULATORY_CARE_PROVIDER_SITE_OTHER): Payer: BLUE CROSS/BLUE SHIELD | Admitting: Internal Medicine

## 2016-03-01 ENCOUNTER — Encounter: Payer: Self-pay | Admitting: Internal Medicine

## 2016-03-01 VITALS — BP 120/72 | HR 70 | Temp 98.6°F | Ht 62.0 in | Wt 187.4 lb

## 2016-03-01 DIAGNOSIS — F439 Reaction to severe stress, unspecified: Secondary | ICD-10-CM

## 2016-03-01 DIAGNOSIS — E669 Obesity, unspecified: Secondary | ICD-10-CM | POA: Diagnosis not present

## 2016-03-01 DIAGNOSIS — I10 Essential (primary) hypertension: Secondary | ICD-10-CM | POA: Diagnosis not present

## 2016-03-01 DIAGNOSIS — E119 Type 2 diabetes mellitus without complications: Secondary | ICD-10-CM

## 2016-03-01 DIAGNOSIS — Z0000 Encounter for general adult medical examination without abnormal findings: Secondary | ICD-10-CM

## 2016-03-01 NOTE — Progress Notes (Signed)
[name], female   DOB: 1955/07/10, 60 y.o.   MRN: [id number]   Subjective:    [name], female    DOB: [birth year], 60 y.o.   MRN: [id number]  HPI  Patient here for a scheduled follow up.  She has been under increased stress with her mother's health issues.  She feels she is handling things relatively well.  No sob.  No acid reflux.  No abdominal pain or cramping.  Bowels stable.  States overall she feels she is doing relatively well.  Discussed diet and exercise.     Past Medical History:  Diagnosis Date  . Diabetes mellitus without complication ([place])   . Environmental allergies   . Glaucoma   . History of chicken pox   . Hypertension    Past Surgical History:  Procedure Laterality Date  . BREAST BIOPSY Right 1999   neg   Family History  Problem Relation Age of Onset  . Heart disease Father   . Hypertension Father   . Diabetes Father   . Diabetes Sister     x2  . Colon polyps Mother   . Breast cancer Neg Hx   . Colon cancer Neg Hx    Social History   Social History  . Marital status: Married    Spouse name: N/A  . Number of children: 3  . Years of education: N/A   Occupational History  .  [employer]   Social History Main Topics  . Smoking status: Never Smoker  . Smokeless tobacco: Never Used  . Alcohol use No  . Drug use: No  . Sexual activity: Not Asked   Other Topics Concern  . None   Social History Narrative  . None    Outpatient Encounter Prescriptions as of 03/01/2016  Medication Sig  . amLODipine (NORVASC) 5 MG tablet TAKE ONE TABLET EVERY DAY  . dorzolamide-timolol (COSOPT) 22.3-6.8 MG/ML ophthalmic solution   . glucose blood test strip Use as instructed  . lisinopril (PRINIVIL,ZESTRIL) 10 MG tablet TAKE ONE TABLET EVERY DAY  . metFORMIN (GLUCOPHAGE) 500 MG tablet TAKE ONE TABLET EVERY DAY  . timolol (BETIMOL) 0.25 % ophthalmic solution 1-2 drops 2 (two) times daily.  Marland Kitchen triamcinolone cream  (KENALOG) 0.1 % APPLY TO AFFECTED AREAS EVERY DAY AS NEEDED  . triamterene-hydrochlorothiazide (DYAZIDE) 37.5-25 MG capsule TAKE 1 CAPSULE EVERY MORNING  . [DISCONTINUED] latanoprost (XALATAN) 0.005 % ophthalmic solution   . [DISCONTINUED] valACYclovir (VALTREX) 1000 MG tablet Take 1 tablet (1,000 mg total) by mouth 3 (three) times daily.   No facility-administered encounter medications on file as of 03/01/2016.     Review of Systems  Constitutional: Negative for appetite change and unexpected weight change.  HENT: Negative for congestion and sinus pressure.   Eyes: Negative for pain and visual disturbance.  Respiratory: Negative for cough, chest tightness and shortness of breath.   Cardiovascular: Negative for chest pain, palpitations and leg swelling.  Gastrointestinal: Negative for abdominal pain, diarrhea, nausea and vomiting.  Genitourinary: Negative for difficulty urinating and dysuria.  Musculoskeletal: Negative for back pain and joint swelling.  Skin: Negative for color change and rash.  Neurological: Negative for dizziness and headaches.  Hematological: Negative for adenopathy. Does not bruise/bleed easily.  Psychiatric/Behavioral: Negative for dysphoric mood.       Increased stress as outlined.         Objective:    Physical Exam  Constitutional: She appears well-developed and well-nourished. No distress.  HENT:  Nose:  Nose normal.  Mouth/Throat: Oropharynx is clear and moist.  Neck: Neck supple. No thyromegaly present.  Cardiovascular: Normal rate and regular rhythm.   Pulmonary/Chest: Breath sounds normal. No respiratory distress. She has no wheezes.  Abdominal: Soft. Bowel sounds are normal. There is no tenderness.  Musculoskeletal: She exhibits no edema or tenderness.  Lymphadenopathy:    She has no cervical adenopathy.  Skin: No rash noted. No erythema.  Psychiatric: She has a normal mood and affect. Her behavior is normal.    BP 120/72   Pulse 70   Temp  98.6 F (37 C) (Oral)   Ht _0  (1.575 m)   Wt 187 lb 6.4 oz (85 kg)   LMP 02/26/2008   SpO2 96%   BMI 34.28 kg/m  Wt Readings from Last 3 Encounters:  03/01/16 187 lb 6.4 oz (85 kg)  07/30/15 187 lb (84.8 kg)  12/16/14 185 lb 6 oz (84.1 kg)     Lab Results  Component Value Date   WBC 5.7 12/23/2014   HGB 14.3 12/23/2014   HCT 42.2 12/23/2014   PLT 215.0 12/23/2014   GLUCOSE 125 (H) 12/23/2014   CHOL 169 12/23/2014   TRIG 144.0 12/23/2014   HDL 44.70 12/23/2014   LDLDIRECT 116.5 04/08/2014   LDLCALC 95 12/23/2014   ALT 28 12/23/2014   AST 22 12/23/2014   NA 139 12/23/2014   K 4.0 12/23/2014   CL 105 12/23/2014   CREATININE 0.80 12/23/2014   BUN 16 12/23/2014   CO2 25 12/23/2014   TSH 0.59 12/23/2014   HGBA1C 6.0 12/23/2014   MICROALBUR 1.6 12/23/2014    Mm Screening Breast Tomo Bilateral  Result Date: 12/10/2015 CLINICAL DATA:  Screening. EXAM: 2D DIGITAL SCREENING BILATERAL MAMMOGRAM WITH CAD AND ADJUNCT TOMO COMPARISON:  Previous exam(s). ACR Breast Density Category c: The breast tissue is heterogeneously dense, which may obscure small masses. FINDINGS: There are no findings suspicious for malignancy. Images were processed with CAD. IMPRESSION: No mammographic evidence of malignancy. A result letter of this screening mammogram will be mailed directly to the patient. RECOMMENDATION: Screening mammogram in one year. (Code:SM-B-01Y) BI-RADS CATEGORY  1: Negative. Electronically Signed   By: Pamelia Hoit M.D.   On: 12/10/2015 18:02       Assessment & Plan:   Problem List Items Addressed This Visit    Diabetes mellitus ([place])    Overdue labs.  Discussed with her today.  Low carb diet and exercise.  Follow met b and a1c.        Relevant Orders   Hemoglobin A1c   Lipid panel   Microalbumin / creatinine urine ratio   Health care maintenance    Physical today 03/01/16.  Mammogram 12/10/15 - Birads I.  Colonoscopy 08/18/12.  Recommended f/u colonoscopy in 08/2017.  PAP  12/06/13 negative with negative HPV.        Hypertension    Blood pressure under good control.  Continue same medication regimen.  Follow pressures.  Follow metabolic panel.        Relevant Orders   CBC with Differential/Platelet   Hepatic function panel   Basic metabolic panel   TSH   Obesity (BMI 30-39.9)    Diet and exercise.  Follow.       Stress    Increased stress as outlined.  Discussed with her today.  She feels she is handling things relatively well.  Follow.  Does not feel needs any further intervention at this time.  Other Visit Diagnoses    Routine general medical examination at a health care facility    -  Primary       Einar Pheasant, MD

## 2016-03-01 NOTE — Progress Notes (Signed)
Pre visit review using our clinic review tool, if applicable. No additional management support is needed unless otherwise documented below in the visit note. 

## 2016-03-07 ENCOUNTER — Encounter: Payer: Self-pay | Admitting: Internal Medicine

## 2016-03-07 NOTE — Assessment & Plan Note (Signed)
Increased stress as outlined.  Discussed with her today.  She feels she is handling things relatively well.  Follow.  Does not feel needs any further intervention at this time.

## 2016-03-07 NOTE — Assessment & Plan Note (Signed)
Blood pressure under good control.  Continue same medication regimen.  Follow pressures.  Follow metabolic panel.   

## 2016-03-07 NOTE — Assessment & Plan Note (Signed)
Diet and exercise.  Follow.  

## 2016-03-07 NOTE — Assessment & Plan Note (Signed)
Physical today 03/01/16.  Mammogram 12/10/15 - Birads I.  Colonoscopy 08/18/12.  Recommended f/u colonoscopy in 08/2017.  PAP 12/06/13 negative with negative HPV.

## 2016-03-07 NOTE — Assessment & Plan Note (Signed)
Overdue labs.  Discussed with her today.  Low carb diet and exercise.  Follow met b and a1c.

## 2016-03-09 ENCOUNTER — Other Ambulatory Visit: Payer: BLUE CROSS/BLUE SHIELD

## 2016-03-15 ENCOUNTER — Other Ambulatory Visit (INDEPENDENT_AMBULATORY_CARE_PROVIDER_SITE_OTHER): Payer: BLUE CROSS/BLUE SHIELD

## 2016-03-15 DIAGNOSIS — I10 Essential (primary) hypertension: Secondary | ICD-10-CM | POA: Diagnosis not present

## 2016-03-15 DIAGNOSIS — E119 Type 2 diabetes mellitus without complications: Secondary | ICD-10-CM | POA: Diagnosis not present

## 2016-03-15 LAB — CBC WITH DIFFERENTIAL/PLATELET
Basophils Absolute: 0.1 10*3/uL (ref 0.0–0.1)
Basophils Relative: 0.8 % (ref 0.0–3.0)
EOS PCT: 5 % (ref 0.0–5.0)
Eosinophils Absolute: 0.3 10*3/uL (ref 0.0–0.7)
HEMATOCRIT: 41.5 % (ref 36.0–46.0)
HEMOGLOBIN: 14 g/dL (ref 12.0–15.0)
LYMPHS PCT: 34.5 % (ref 12.0–46.0)
Lymphs Abs: 2.1 10*3/uL (ref 0.7–4.0)
MCHC: 33.8 g/dL (ref 30.0–36.0)
MCV: 90.5 fl (ref 78.0–100.0)
MONO ABS: 0.6 10*3/uL (ref 0.1–1.0)
Monocytes Relative: 10.4 % (ref 3.0–12.0)
Neutro Abs: 3 10*3/uL (ref 1.4–7.7)
Neutrophils Relative %: 49.3 % (ref 43.0–77.0)
Platelets: 233 10*3/uL (ref 150.0–400.0)
RBC: 4.59 Mil/uL (ref 3.87–5.11)
RDW: 13.4 % (ref 11.5–15.5)
WBC: 6 10*3/uL (ref 4.0–10.5)

## 2016-03-15 LAB — LIPID PANEL
CHOLESTEROL: 178 mg/dL (ref 0–200)
HDL: 42.6 mg/dL (ref >39.00)
LDL CALC: 98 mg/dL (ref 0–99)
NonHDL: 135.23
TRIGLYCERIDES: 188 mg/dL — AB (ref 0.0–149.0)
Total CHOL/HDL Ratio: 4
VLDL: 37.6 mg/dL (ref 0.0–40.0)

## 2016-03-15 LAB — BASIC METABOLIC PANEL
BUN: 16 mg/dL (ref 6–23)
CO2: 26 meq/L (ref 19–32)
Calcium: 9.2 mg/dL (ref 8.4–10.5)
Chloride: 105 mEq/L (ref 96–112)
Creatinine, Ser: 0.84 mg/dL (ref 0.40–1.20)
GFR: 73.41 mL/min (ref >60.00)
GLUCOSE: 186 mg/dL — AB (ref 70–99)
POTASSIUM: 4.2 meq/L (ref 3.5–5.1)
SODIUM: 139 meq/L (ref 135–145)

## 2016-03-15 LAB — HEPATIC FUNCTION PANEL
ALBUMIN: 4.1 g/dL (ref 3.5–5.2)
ALK PHOS: 69 U/L (ref 39–117)
ALT: 31 U/L (ref 0–35)
AST: 20 U/L (ref 0–37)
BILIRUBIN TOTAL: 0.5 mg/dL (ref 0.2–1.2)
Bilirubin, Direct: 0.1 mg/dL (ref 0.0–0.3)
Total Protein: 6.6 g/dL (ref 6.0–8.3)

## 2016-03-15 LAB — HEMOGLOBIN A1C: HEMOGLOBIN A1C: 7 % — AB (ref 4.6–6.5)

## 2016-03-15 LAB — TSH: TSH: 0.6 u[IU]/mL (ref 0.35–4.50)

## 2016-03-15 LAB — MICROALBUMIN / CREATININE URINE RATIO
Creatinine,U: 182.9 mg/dL
MICROALB/CREAT RATIO: 1 mg/g (ref 0.0–30.0)
Microalb, Ur: 1.9 mg/dL (ref 0.0–1.9)

## 2016-05-04 ENCOUNTER — Other Ambulatory Visit: Payer: Self-pay | Admitting: Internal Medicine

## 2016-07-30 ENCOUNTER — Other Ambulatory Visit: Payer: Self-pay | Admitting: Internal Medicine

## 2016-09-06 ENCOUNTER — Ambulatory Visit: Payer: BLUE CROSS/BLUE SHIELD | Admitting: Internal Medicine

## 2016-09-06 ENCOUNTER — Encounter: Payer: Self-pay | Admitting: Internal Medicine

## 2016-09-06 ENCOUNTER — Ambulatory Visit (INDEPENDENT_AMBULATORY_CARE_PROVIDER_SITE_OTHER): Payer: BLUE CROSS/BLUE SHIELD | Admitting: Internal Medicine

## 2016-09-06 VITALS — BP 118/68 | HR 61 | Temp 99.0°F | Resp 12 | Ht 62.0 in | Wt 190.0 lb

## 2016-09-06 DIAGNOSIS — I10 Essential (primary) hypertension: Secondary | ICD-10-CM | POA: Diagnosis not present

## 2016-09-06 DIAGNOSIS — E119 Type 2 diabetes mellitus without complications: Secondary | ICD-10-CM | POA: Diagnosis not present

## 2016-09-06 DIAGNOSIS — R6889 Other general symptoms and signs: Secondary | ICD-10-CM

## 2016-09-06 DIAGNOSIS — F439 Reaction to severe stress, unspecified: Secondary | ICD-10-CM

## 2016-09-06 DIAGNOSIS — E669 Obesity, unspecified: Secondary | ICD-10-CM | POA: Diagnosis not present

## 2016-09-06 LAB — BASIC METABOLIC PANEL
BUN: 16 mg/dL (ref 6–23)
CALCIUM: 9.1 mg/dL (ref 8.4–10.5)
CHLORIDE: 106 meq/L (ref 96–112)
CO2: 27 mEq/L (ref 19–32)
CREATININE: 0.79 mg/dL (ref 0.40–1.20)
GFR: 78.67 mL/min (ref >60.00)
Glucose, Bld: 167 mg/dL — ABNORMAL HIGH (ref 70–99)
Potassium: 4.5 mEq/L (ref 3.5–5.1)
Sodium: 139 mEq/L (ref 135–145)

## 2016-09-06 LAB — HEMOGLOBIN A1C: HEMOGLOBIN A1C: 7.3 % — AB (ref 4.6–6.5)

## 2016-09-06 LAB — HEPATIC FUNCTION PANEL
ALBUMIN: 4.1 g/dL (ref 3.5–5.2)
ALK PHOS: 67 U/L (ref 39–117)
ALT: 29 U/L (ref 0–35)
AST: 21 U/L (ref 0–37)
BILIRUBIN DIRECT: 0.1 mg/dL (ref 0.0–0.3)
TOTAL PROTEIN: 6.8 g/dL (ref 6.0–8.3)
Total Bilirubin: 0.4 mg/dL (ref 0.2–1.2)

## 2016-09-06 LAB — LIPID PANEL
Cholesterol: 146 mg/dL (ref 0–200)
HDL: 42 mg/dL (ref >39.00)
LDL Cholesterol: 70 mg/dL (ref 0–99)
NonHDL: 104.43
Total CHOL/HDL Ratio: 3
Triglycerides: 173 mg/dL — ABNORMAL HIGH (ref 0.0–149.0)
VLDL: 34.6 mg/dL (ref 0.0–40.0)

## 2016-09-06 NOTE — Patient Instructions (Signed)
Saline nasal spray - flush nose at least 2-3x/day  flonase nasal spry - 2 sprays each nostril one time per day as needed.  Do this in the evening.

## 2016-09-06 NOTE — Progress Notes (Signed)
[name], female   DOB: Nov 28, 1955, 61 y.o.   MRN: [id number]   Subjective:    [name], female    DOB: [birth year], 61 y.o.   MRN: [id number]  HPI  Patient here for a scheduled follow up.  She has been under increased stress recently.  Her mother's alzheimers is getting worse.  She is being moved to a nursing facility close to [place].  She is sad about this, but overall she feels she is handling things relatively well.  Does not feel needs any further intervention.  She has had a persistent cough.  No significant nasal congestion, but does have some.  Possible drainage.  No sinus pressure.  No chest congestion.  No wheezing. No sob.  No acid reflux reported.  No abdominal pain.  Bowels moving.     Past Medical History:  Diagnosis Date  . Diabetes mellitus without complication ([place])   . Environmental allergies   . Glaucoma   . History of chicken pox   . Hypertension    Past Surgical History:  Procedure Laterality Date  . BREAST BIOPSY Right 1999   neg   Family History  Problem Relation Age of Onset  . Heart disease Father   . Hypertension Father   . Diabetes Father   . Diabetes Sister        x2  . Colon polyps Mother   . Breast cancer Neg Hx   . Colon cancer Neg Hx    Social History   Social History  . Marital status: Married    Spouse name: N/A  . Number of children: 3  . Years of education: N/A   Occupational History  .  [employer]   Social History Main Topics  . Smoking status: Never Smoker  . Smokeless tobacco: Never Used  . Alcohol use No  . Drug use: No  . Sexual activity: Not Asked   Other Topics Concern  . None   Social History Narrative  . None    Outpatient Encounter Prescriptions as of 09/06/2016  Medication Sig  . amLODipine (NORVASC) 5 MG tablet TAKE ONE TABLET EVERY DAY  . dorzolamide-timolol (COSOPT) 22.3-6.8 MG/ML ophthalmic solution   . glucose blood test strip Use as instructed  .  lisinopril (PRINIVIL,ZESTRIL) 10 MG tablet TAKE ONE TABLET EVERY DAY  . metFORMIN (GLUCOPHAGE) 500 MG tablet TAKE ONE TABLET EVERY DAY  . timolol (BETIMOL) 0.25 % ophthalmic solution 1-2 drops 2 (two) times daily.  Marland Kitchen triamcinolone cream (KENALOG) 0.1 % APPLY TO AFFECTED AREAS EVERY DAY AS NEEDED  . triamterene-hydrochlorothiazide (DYAZIDE) 37.5-25 MG capsule TAKE 1 CAPSULE EVERY MORNING   No facility-administered encounter medications on file as of 09/06/2016.     Review of Systems  Constitutional: Negative for appetite change and unexpected weight change.  HENT: Positive for congestion and postnasal drip. Negative for sinus pressure.        States ears drain at night.  No pain.   Respiratory: Positive for cough. Negative for chest tightness, shortness of breath and wheezing.   Cardiovascular: Negative for chest pain, palpitations and leg swelling.  Gastrointestinal: Negative for abdominal pain, diarrhea, nausea and vomiting.  Genitourinary: Negative for difficulty urinating and dysuria.  Musculoskeletal: Negative for back pain and joint swelling.  Skin: Negative for color change and rash.  Neurological: Negative for dizziness, light-headedness and headaches.  Psychiatric/Behavioral: Negative for agitation and dysphoric mood.       Objective:     Blood pressure  rechecked by me:  118/72  Physical Exam  Constitutional: She appears well-developed and well-nourished. No distress.  HENT:  Nose: Nose normal.  Mouth/Throat: Oropharynx is clear and moist.  TMs without erythema.   Neck: Neck supple. No thyromegaly present.  Cardiovascular: Normal rate and regular rhythm.   Pulmonary/Chest: Breath sounds normal. No respiratory distress. She has no wheezes.  Abdominal: Soft. Bowel sounds are normal. There is no tenderness.  Musculoskeletal: She exhibits no edema or tenderness.  Lymphadenopathy:    She has no cervical adenopathy.  Skin: No rash noted. No erythema.  Psychiatric: She has a  normal mood and affect. Her behavior is normal.    BP 118/68 (BP Location: Left Arm, Patient Position: Sitting, Cuff Size: Normal)   Pulse 61   Temp 99 F (37.2 C) (Oral)   Resp 12   Ht _0  (1.575 m)   Wt 190 lb (86.2 kg)   LMP 02/26/2008   SpO2 98%   BMI 34.75 kg/m  Wt Readings from Last 3 Encounters:  09/06/16 190 lb (86.2 kg)  03/01/16 187 lb 6.4 oz (85 kg)  07/30/15 187 lb (84.8 kg)     Lab Results  Component Value Date   WBC 6.0 03/15/2016   HGB 14.0 03/15/2016   HCT 41.5 03/15/2016   PLT 233.0 03/15/2016   GLUCOSE 167 (H) 09/06/2016   CHOL 146 09/06/2016   TRIG 173.0 (H) 09/06/2016   HDL 42.00 09/06/2016   LDLDIRECT 116.5 04/08/2014   LDLCALC 70 09/06/2016   ALT 29 09/06/2016   AST 21 09/06/2016   NA 139 09/06/2016   K 4.5 09/06/2016   CL 106 09/06/2016   CREATININE 0.79 09/06/2016   BUN 16 09/06/2016   CO2 27 09/06/2016   TSH 0.60 03/15/2016   HGBA1C 7.3 (H) 09/06/2016   MICROALBUR 1.9 03/15/2016    Mm Screening Breast Tomo Bilateral  Result Date: 12/10/2015 CLINICAL DATA:  Screening. EXAM: 2D DIGITAL SCREENING BILATERAL MAMMOGRAM WITH CAD AND ADJUNCT TOMO COMPARISON:  Previous exam(s). ACR Breast Density Category c: The breast tissue is heterogeneously dense, which may obscure small masses. FINDINGS: There are no findings suspicious for malignancy. Images were processed with CAD. IMPRESSION: No mammographic evidence of malignancy. A result letter of this screening mammogram will be mailed directly to the patient. RECOMMENDATION: Screening mammogram in one year. (Code:SM-B-01Y) BI-RADS CATEGORY  1: Negative. Electronically Signed   By: Pamelia Hoit M.D.   On: 12/10/2015 18:02       Assessment & Plan:   Problem List Items Addressed This Visit    Diabetes mellitus ([place])    Low carb diet and exercise.  Follow met b and a1c.        Relevant Orders   Hemoglobin A1c (Completed)   Hepatic function panel (Completed)   Lipid panel (Completed)   Hypertension  - Primary    Blood pressure under good control.  Continue same medication regimen.  Follow pressures.  Follow metabolic panel.        Relevant Orders   Basic metabolic panel (Completed)   Obesity (BMI 30-39.9)    Diet and exercise.  Follow.       Stress    Increased stress as outlined.  Discussed with her today.  Does not feel needs anything more at this time.  Follow.         Other Visit Diagnoses    Congestion of throat       symptoms as outlined.  saline nasal psray and steroid  nasal spray as outlined.  follow.  notify me if persistent symptoms.         Einar Pheasant, MD

## 2016-09-07 ENCOUNTER — Encounter: Payer: Self-pay | Admitting: Internal Medicine

## 2016-09-07 ENCOUNTER — Other Ambulatory Visit: Payer: Self-pay

## 2016-09-07 MED ORDER — METFORMIN HCL 500 MG PO TABS: 500.0000 mg | ORAL_TABLET | Freq: Two times a day (BID) | ORAL | 1 refills | Status: DC

## 2016-09-07 NOTE — Assessment & Plan Note (Signed)
Diet and exercise.  Follow.  

## 2016-09-07 NOTE — Assessment & Plan Note (Signed)
Low carb diet and exercise.  Follow met b and a1c.   

## 2016-09-07 NOTE — Assessment & Plan Note (Signed)
Increased stress as outlined.  Discussed with her today.  Does not feel needs anything more at this time.  Follow.   

## 2016-09-07 NOTE — Assessment & Plan Note (Signed)
Blood pressure under good control.  Continue same medication regimen.  Follow pressures.  Follow metabolic panel.   

## 2016-09-13 ENCOUNTER — Encounter: Payer: Self-pay | Admitting: Internal Medicine

## 2016-09-13 ENCOUNTER — Other Ambulatory Visit: Payer: Self-pay | Admitting: Internal Medicine

## 2016-09-13 DIAGNOSIS — Z1231 Encounter for screening mammogram for malignant neoplasm of breast: Secondary | ICD-10-CM

## 2016-10-11 ENCOUNTER — Other Ambulatory Visit: Payer: Self-pay | Admitting: Internal Medicine

## 2016-11-25 ENCOUNTER — Other Ambulatory Visit: Payer: Self-pay | Admitting: Internal Medicine

## 2016-12-13 ENCOUNTER — Ambulatory Visit
Admission: RE | Admit: 2016-12-13 | Discharge: 2016-12-13 | Disposition: A | Discharge status: Home / Self Care | Payer: BLUE CROSS/BLUE SHIELD | Source: Ambulatory Visit | Attending: Internal Medicine | Admitting: Internal Medicine

## 2016-12-13 DIAGNOSIS — Z1231 Encounter for screening mammogram for malignant neoplasm of breast: Secondary | ICD-10-CM | POA: Diagnosis present

## 2016-12-14 LAB — HM MAMMOGRAPHY

## 2016-12-28 ENCOUNTER — Other Ambulatory Visit: Payer: Self-pay | Admitting: Internal Medicine

## 2017-01-10 LAB — HM DIABETES EYE EXAM

## 2017-01-11 LAB — HM DIABETES EYE EXAM

## 2017-01-25 ENCOUNTER — Other Ambulatory Visit: Payer: Self-pay | Admitting: Internal Medicine

## 2017-01-25 ENCOUNTER — Telehealth: Payer: Self-pay | Admitting: Internal Medicine

## 2017-01-25 NOTE — Telephone Encounter (Signed)
Pt called and stated that she had gone to walk in and they wanted her to follow up with Dr.Scott for an infectious gland. Please advise, thank you!  Call pt @ [phone]

## 2017-01-25 NOTE — Telephone Encounter (Signed)
If doing ok, please schedule her for 10:00 on 02/04/17 for acute care f/u.  Thanks

## 2017-01-25 NOTE — Telephone Encounter (Signed)
Patient was seen on 01/17/17 for infection. She has finished doxycycline. She has no fever, swelling has gone completely down and is not having any pain at this time. Looks like she had f/u with walk in but cancelled and told them that she would follow up with you. She states she was told to f/u with you 2 weeks I do not see any openings not sure when you would like to have put in.

## 2017-01-25 NOTE — Telephone Encounter (Signed)
LMTCB

## 2017-03-15 ENCOUNTER — Encounter: Payer: BLUE CROSS/BLUE SHIELD | Admitting: Internal Medicine

## 2017-03-23 ENCOUNTER — Ambulatory Visit (INDEPENDENT_AMBULATORY_CARE_PROVIDER_SITE_OTHER): Payer: BLUE CROSS/BLUE SHIELD | Admitting: Internal Medicine

## 2017-03-23 ENCOUNTER — Other Ambulatory Visit (HOSPITAL_COMMUNITY)
Admission: RE | Admit: 2017-03-23 | Discharge: 2017-03-23 | Disposition: A | Discharge status: Home / Self Care | Payer: BLUE CROSS/BLUE SHIELD | Source: Ambulatory Visit | Attending: Internal Medicine | Admitting: Internal Medicine

## 2017-03-23 ENCOUNTER — Encounter: Payer: Self-pay | Admitting: Internal Medicine

## 2017-03-23 VITALS — BP 122/70 | HR 60 | Temp 98.3°F | Ht 62.0 in | Wt 179.0 lb

## 2017-03-23 DIAGNOSIS — E669 Obesity, unspecified: Secondary | ICD-10-CM | POA: Diagnosis not present

## 2017-03-23 DIAGNOSIS — I10 Essential (primary) hypertension: Secondary | ICD-10-CM | POA: Diagnosis not present

## 2017-03-23 DIAGNOSIS — Z0001 Encounter for general adult medical examination with abnormal findings: Secondary | ICD-10-CM | POA: Diagnosis not present

## 2017-03-23 DIAGNOSIS — F439 Reaction to severe stress, unspecified: Secondary | ICD-10-CM | POA: Diagnosis not present

## 2017-03-23 DIAGNOSIS — Z0000 Encounter for general adult medical examination without abnormal findings: Secondary | ICD-10-CM | POA: Diagnosis not present

## 2017-03-23 DIAGNOSIS — H409 Unspecified glaucoma: Secondary | ICD-10-CM

## 2017-03-23 DIAGNOSIS — R238 Other skin changes: Secondary | ICD-10-CM

## 2017-03-23 DIAGNOSIS — E119 Type 2 diabetes mellitus without complications: Secondary | ICD-10-CM | POA: Diagnosis not present

## 2017-03-23 LAB — BASIC METABOLIC PANEL
BUN: 18 mg/dL (ref 6–23)
CHLORIDE: 105 meq/L (ref 96–112)
CO2: 28 mEq/L (ref 19–32)
Calcium: 9 mg/dL (ref 8.4–10.5)
Creatinine, Ser: 0.8 mg/dL (ref 0.40–1.20)
GFR: 77.4 mL/min (ref >60.00)
Glucose, Bld: 132 mg/dL — ABNORMAL HIGH (ref 70–99)
POTASSIUM: 4.1 meq/L (ref 3.5–5.1)
SODIUM: 141 meq/L (ref 135–145)

## 2017-03-23 LAB — CBC WITH DIFFERENTIAL/PLATELET
Basophils Absolute: 0.1 10*3/uL (ref 0.0–0.1)
Basophils Relative: 0.8 % (ref 0.0–3.0)
EOS PCT: 3.7 % (ref 0.0–5.0)
Eosinophils Absolute: 0.2 10*3/uL (ref 0.0–0.7)
HEMATOCRIT: 42.1 % (ref 36.0–46.0)
Hemoglobin: 13.9 g/dL (ref 12.0–15.0)
LYMPHS ABS: 1.8 10*3/uL (ref 0.7–4.0)
LYMPHS PCT: 30.7 % (ref 12.0–46.0)
MCHC: 33 g/dL (ref 30.0–36.0)
MCV: 92.4 fl (ref 78.0–100.0)
MONOS PCT: 11.8 % (ref 3.0–12.0)
Monocytes Absolute: 0.7 10*3/uL (ref 0.1–1.0)
NEUTROS ABS: 3.2 10*3/uL (ref 1.4–7.7)
NEUTROS PCT: 53 % (ref 43.0–77.0)
PLATELETS: 223 10*3/uL (ref 150.0–400.0)
RBC: 4.55 Mil/uL (ref 3.87–5.11)
RDW: 13.8 % (ref 11.5–15.5)
WBC: 6 10*3/uL (ref 4.0–10.5)

## 2017-03-23 LAB — MICROALBUMIN / CREATININE URINE RATIO
Creatinine,U: 143.9 mg/dL
MICROALB/CREAT RATIO: 0.5 mg/g (ref 0.0–30.0)
Microalb, Ur: 0.7 mg/dL (ref 0.0–1.9)

## 2017-03-23 LAB — HEPATIC FUNCTION PANEL
ALBUMIN: 4.1 g/dL (ref 3.5–5.2)
ALK PHOS: 67 U/L (ref 39–117)
ALT: 18 U/L (ref 0–35)
AST: 16 U/L (ref 0–37)
Bilirubin, Direct: 0.1 mg/dL (ref 0.0–0.3)
TOTAL PROTEIN: 7.2 g/dL (ref 6.0–8.3)
Total Bilirubin: 0.4 mg/dL (ref 0.2–1.2)

## 2017-03-23 LAB — LIPID PANEL
CHOL/HDL RATIO: 4
Cholesterol: 155 mg/dL (ref 0–200)
HDL: 44.1 mg/dL (ref >39.00)
LDL Cholesterol: 82 mg/dL (ref 0–99)
NONHDL: 110.6
Triglycerides: 144 mg/dL (ref 0.0–149.0)
VLDL: 28.8 mg/dL (ref 0.0–40.0)

## 2017-03-23 LAB — HM PAP SMEAR

## 2017-03-23 LAB — TSH: TSH: 0.55 u[IU]/mL (ref 0.35–4.50)

## 2017-03-23 LAB — HM DIABETES FOOT EXAM

## 2017-03-23 LAB — HEMOGLOBIN A1C: Hgb A1c MFr Bld: 6.3 % (ref 4.6–6.5)

## 2017-03-23 MED ORDER — DOXYCYCLINE HYCLATE 100 MG PO TABS: 100.0000 mg | ORAL_TABLET | Freq: Two times a day (BID) | ORAL | 0 refills | Status: DC

## 2017-03-23 NOTE — Patient Instructions (Signed)
Take a probiotic daily while you are on the antibiotics and for two weeks after completing the antibiotics.   

## 2017-03-23 NOTE — Progress Notes (Signed)
Pre visit review using our clinic review tool, if applicable. No additional management support is needed unless otherwise documented below in the visit note. 

## 2017-03-23 NOTE — Progress Notes (Signed)
[name], female   DOB: 1956/03/10, 61 y.o.   MRN: [id number]   Subjective:    [name], female    DOB: 08-Jul-1955, 61 y.o.   MRN: [id number]  HPI  Patient here for her physical exam.  Reports some increased stress.  Her mother is now in a skilled nursing facility out of town.  She does not see her as much.  Discussed with her today.  Overall she feels she is handling things relatively well.  Does not feel needs any further intervention.  Tries to stay active.  Working more.  No chest pain.  No sob.  No acid reflux.  Has lost weight.  Recently treated for "abscess" posterior left ear.  Note reviewed.  Reports noticed increased tenderness over the last two days.  Raised lesion - posterior right ear.  She has also been having increased scaling - scalp.  Has tried multiple shampoos.  No help.  No fever.  Eating.  No abdominal pain.  Bowels moving.     Past Medical History:  Diagnosis Date  . Diabetes mellitus without complication ([place])   . Environmental allergies   . Glaucoma   . History of chicken pox   . Hypertension    Past Surgical History:  Procedure Laterality Date  . BREAST BIOPSY Right 1999   neg   Family History  Problem Relation Age of Onset  . Heart disease Father   . Hypertension Father   . Diabetes Father   . Diabetes Sister        x2  . Colon polyps Mother   . Breast cancer Neg Hx   . Colon cancer Neg Hx    Social History   Socioeconomic History  . Marital status: Married    Spouse name: None  . Number of children: 3  . Years of education: None  . Highest education level: None  Social Needs  . Financial resource strain: None  . Food insecurity - worry: None  . Food insecurity - inability: None  . Transportation needs - medical: None  . Transportation needs - non-medical: None  Occupational History    Employer: [employer]  Tobacco Use  . Smoking status: Never Smoker  . Smokeless tobacco: Never Used  Substance  and Sexual Activity  . Alcohol use: No    Alcohol/week: 0.0 oz  . Drug use: No  . Sexual activity: None  Other Topics Concern  . None  Social History Narrative  . None    Outpatient Encounter Medications as of 03/23/2017  Medication Sig  . amLODipine (NORVASC) 5 MG tablet TAKE ONE TABLET BY MOUTH EVERY DAY  . CONTOUR NEXT TEST test strip TEST TWICE DAILY  . dorzolamide-timolol (COSOPT) 22.3-6.8 MG/ML ophthalmic solution   . lisinopril (PRINIVIL,ZESTRIL) 10 MG tablet TAKE ONE TABLET BY MOUTH EVERY DAY  . metFORMIN (GLUCOPHAGE) 500 MG tablet Take 1 tablet (500 mg total) by mouth 2 (two) times daily with a meal.  . timolol (BETIMOL) 0.25 % ophthalmic solution 1-2 drops 2 (two) times daily.  Marland Kitchen triamcinolone cream (KENALOG) 0.1 % APPLY TO AFFECTED AREAS EVERY DAY AS NEEDED  . triamterene-hydrochlorothiazide (DYAZIDE) 37.5-25 MG capsule TAKE 1 CAPSULE BY MOUTH EVERY MORNING  . doxycycline (VIBRA-TABS) 100 MG tablet Take 1 tablet (100 mg total) by mouth 2 (two) times daily.   No facility-administered encounter medications on file as of 03/23/2017.     Review of Systems  Constitutional: Negative for appetite change and unexpected weight  change.  HENT: Negative for congestion, sinus pressure and sore throat.   Respiratory: Negative for cough, chest tightness and shortness of breath.   Cardiovascular: Negative for chest pain, palpitations and leg swelling.  Gastrointestinal: Negative for abdominal pain, diarrhea, nausea and vomiting.  Genitourinary: Negative for difficulty urinating and dysuria.  Musculoskeletal: Negative for back pain and joint swelling.  Skin:       Raised lesion - posterior right ear.  Increased tenderness.  Scaling of scalp.    Neurological: Negative for dizziness, light-headedness and headaches.  Psychiatric/Behavioral: Negative for agitation and dysphoric mood.       Increased stress as outlined.         Objective:    Physical Exam  Constitutional: She is  oriented to person, place, and time. She appears well-developed and well-nourished. No distress.  HENT:  Nose: Nose normal.  Mouth/Throat: Oropharynx is clear and moist.  Eyes: Right eye exhibits no discharge. Left eye exhibits no discharge. No scleral icterus.  Neck: Neck supple. No thyromegaly present.  Palpable lymph node - posterior right cervical region.    Cardiovascular: Normal rate and regular rhythm.  Pulmonary/Chest: Breath sounds normal. No accessory muscle usage. No tachypnea. No respiratory distress. She has no decreased breath sounds. She has no wheezes. She has no rhonchi. Right breast exhibits no inverted nipple, no mass, no nipple discharge and no tenderness (no axillary adenopathy). Left breast exhibits no inverted nipple, no mass, no nipple discharge and no tenderness (no axilarry adenopathy).  Abdominal: Soft. Bowel sounds are normal. There is no tenderness.  Genitourinary:  Genitourinary Comments: Normal external genitalia.  Vaginal vault without lesions.  Cervix identified.  Pap smear performed.  Could not appreciate any adnexal masses or tenderness.    Musculoskeletal: She exhibits no edema or tenderness.  Feet- no lesions.  Intact to light touch.  DP pulses palpable and equal bilaterally.   Neurological: She is alert and oriented to person, place, and time.  Skin: Skin is warm. No rash noted. No erythema.  Raised tender lesion - posterior right ear.    Psychiatric: She has a normal mood and affect. Her behavior is normal.    BP 122/70   Pulse 60   Temp 98.3 F (36.8 C) (Oral)   Ht 5' 2"  (1.575 m)   Wt 179 lb (81.2 kg)   LMP 02/26/2008   SpO2 98%   BMI 32.74 kg/m  Wt Readings from Last 3 Encounters:  03/23/17 179 lb (81.2 kg)  09/06/16 190 lb (86.2 kg)  03/01/16 187 lb 6.4 oz (85 kg)     Lab Results  Component Value Date   WBC 6.0 03/23/2017   HGB 13.9 03/23/2017   HCT 42.1 03/23/2017   PLT 223.0 03/23/2017   GLUCOSE 132 (H) 03/23/2017   CHOL 155  03/23/2017   TRIG 144.0 03/23/2017   HDL 44.10 03/23/2017   LDLDIRECT 116.5 04/08/2014   LDLCALC 82 03/23/2017   ALT 18 03/23/2017   AST 16 03/23/2017   NA 141 03/23/2017   K 4.1 03/23/2017   CL 105 03/23/2017   CREATININE 0.80 03/23/2017   BUN 18 03/23/2017   CO2 28 03/23/2017   TSH 0.55 03/23/2017   HGBA1C 6.3 03/23/2017   MICROALBUR 0.7 03/23/2017    Mm Screening Breast Tomo Bilateral  Result Date: 12/14/2016 CLINICAL DATA:  Screening. EXAM: 2D DIGITAL SCREENING BILATERAL MAMMOGRAM WITH CAD AND ADJUNCT TOMO COMPARISON:  Previous exam(s). ACR Breast Density Category c: The breast tissue is heterogeneously dense, which  may obscure small masses. FINDINGS: There are no findings suspicious for malignancy. Images were processed with CAD. IMPRESSION: No mammographic evidence of malignancy. A result letter of this screening mammogram will be mailed directly to the patient. RECOMMENDATION: Screening mammogram in one year. (Code:SM-B-01Y) BI-RADS CATEGORY  1: Negative. Electronically Signed   By: Fidela Salisbury M.D.   On: 12/14/2016 10:18       Assessment & Plan:   Problem List Items Addressed This Visit    Diabetes mellitus ([place])    Sugars averaging 130-150 in the am and 115-130s in the pm.  Continue diet adjustment and exercise.  Up to date with eye checks.  Check met b and a1c.        Relevant Orders   Hemoglobin A1c (Completed)   Hepatic function panel (Completed)   Lipid panel (Completed)   Microalbumin / creatinine urine ratio (Completed)   Glaucoma    Followed by ophthalmology.        Health care maintenance - Primary    Physical today 03/23/17.  Mammogram 12/14/16 - birads I.  Colonoscopy 08/2012.  Recommended f/u colonoscopy in 08/2017.  PAP 03/23/17.        Relevant Orders   Cytology - PAP (Completed)   Hypertension    Blood pressure under good control.  Continue same medication regimen.  Follow pressures.  Follow metabolic panel.        Relevant Orders   CBC  with Differential/Platelet (Completed)   TSH (Completed)   Basic metabolic panel (Completed)   Obesity (BMI 30-39.9)    Discussed diet and exercise.  Follow.       Scalp irritation    Persistent scaling.  Will refer to dermatology.  Feel may be contributing to the infected lesion - posterior ear.  Treat with doxycycline.  Warm compresses.  Follow closely.  Notify me if worsening or incomplete resolution.        Relevant Orders   Ambulatory referral to Dermatology   Stress    Increased stress as outlined.  Discussed with her today.  Overall she feels she is handling things relatively well.  Follow.            Einar Pheasant, MD

## 2017-03-23 NOTE — Assessment & Plan Note (Signed)
Physical today 03/23/17.  Mammogram 12/14/16 - birads I.  Colonoscopy 08/2012.  Recommended f/u colonoscopy in 08/2017.  PAP 03/23/17.

## 2017-03-24 ENCOUNTER — Encounter: Payer: Self-pay | Admitting: Internal Medicine

## 2017-03-24 LAB — CYTOLOGY - PAP
DIAGNOSIS: NEGATIVE
HPV (WINDOPATH): NOT DETECTED

## 2017-03-26 ENCOUNTER — Encounter: Payer: Self-pay | Admitting: Internal Medicine

## 2017-03-26 NOTE — Assessment & Plan Note (Signed)
Blood pressure under good control.  Continue same medication regimen.  Follow pressures.  Follow metabolic panel.   

## 2017-03-26 NOTE — Assessment & Plan Note (Signed)
Discussed diet and exercise.  Follow.  

## 2017-03-26 NOTE — Assessment & Plan Note (Signed)
Followed by ophthalmology.   

## 2017-03-26 NOTE — Assessment & Plan Note (Signed)
Sugars averaging 130-150 in the am and 115-130s in the pm.  Continue diet adjustment and exercise.  Up to date with eye checks.  Check met b and a1c.

## 2017-03-26 NOTE — Assessment & Plan Note (Signed)
Persistent scaling.  Will refer to dermatology.  Feel may be contributing to the infected lesion - posterior ear.  Treat with doxycycline.  Warm compresses.  Follow closely.  Notify me if worsening or incomplete resolution.

## 2017-03-26 NOTE — Assessment & Plan Note (Signed)
Increased stress as outlined.  Discussed with her today.  Overall she feels she is handling things relatively well.  Follow.   

## 2017-04-01 ENCOUNTER — Encounter: Payer: Self-pay | Admitting: Internal Medicine

## 2017-05-02 ENCOUNTER — Other Ambulatory Visit: Payer: Self-pay | Admitting: Internal Medicine

## 2017-05-03 ENCOUNTER — Other Ambulatory Visit: Payer: Self-pay | Admitting: Internal Medicine

## 2017-05-25 ENCOUNTER — Other Ambulatory Visit: Payer: Self-pay | Admitting: Internal Medicine

## 2017-06-16 ENCOUNTER — Telehealth: Payer: Self-pay | Admitting: *Deleted

## 2017-06-16 NOTE — Telephone Encounter (Signed)
Message was regarding spouse. I have talked with her already.

## 2017-06-16 NOTE — Telephone Encounter (Signed)
Copied from [place]. Topic: Quick Communication - See Telephone Encounter >> Jun 16, 2017  9:23 AM Antonieta Iba C wrote: CRM for notification. See Telephone encounter for: pt called in stating that she is returning [place] call. Not showing a note in chart OR CRM. Pt says that the message was not detailed at all so she is not sure what the call was in regards to. Pt is requesting a call back from [place].     06/16/17.

## 2017-06-28 ENCOUNTER — Encounter: Payer: Self-pay | Admitting: Internal Medicine

## 2017-06-28 ENCOUNTER — Ambulatory Visit: Payer: BLUE CROSS/BLUE SHIELD | Admitting: Internal Medicine

## 2017-06-28 DIAGNOSIS — E669 Obesity, unspecified: Secondary | ICD-10-CM

## 2017-06-28 DIAGNOSIS — F439 Reaction to severe stress, unspecified: Secondary | ICD-10-CM | POA: Diagnosis not present

## 2017-06-28 DIAGNOSIS — I10 Essential (primary) hypertension: Secondary | ICD-10-CM | POA: Diagnosis not present

## 2017-06-28 DIAGNOSIS — E119 Type 2 diabetes mellitus without complications: Secondary | ICD-10-CM

## 2017-06-28 NOTE — Progress Notes (Signed)
[name], female   DOB: May 17, 1955, 62 y.o.   MRN: [id number]   Subjective:    [name], female    DOB: [birth year], 62 y.o.   MRN: [id number]  HPI  Patient here for a scheduled follow up.  She reports she is doing well.  Feels good.  Tries to stay active.  Working.  No chest pain.  No sob.  No acid reflux.  No abdominal pain.  Bowels moving.  No urine change.  Is exercising.  Riding her bike.  Overall she feels good.  Reviewed sugars.  Most sugars averaging 120-150.  Discussed low carb diet.     Past Medical History:  Diagnosis Date  . Diabetes mellitus without complication ([place])   . Environmental allergies   . Glaucoma   . History of chicken pox   . Hypertension    Past Surgical History:  Procedure Laterality Date  . BREAST BIOPSY Right 1999   neg   Family History  Problem Relation Age of Onset  . Heart disease Father   . Hypertension Father   . Diabetes Father   . Diabetes Sister        x2  . Colon polyps Mother   . Breast cancer Neg Hx   . Colon cancer Neg Hx    Social History   Socioeconomic History  . Marital status: Married    Spouse name: Not on file  . Number of children: 3  . Years of education: Not on file  . Highest education level: Not on file  Occupational History    Employer: [employer]  . Financial resource strain: Not on file  . Food insecurity:    Worry: Not on file    Inability: Not on file  . Transportation needs:    Medical: Not on file    Non-medical: Not on file  Tobacco Use  . Smoking status: Never Smoker  . Smokeless tobacco: Never Used  Substance and Sexual Activity  . Alcohol use: No    Alcohol/week: 0.0 oz  . Drug use: No  . Sexual activity: Not on file  Lifestyle  . Physical activity:    Days per week: Not on file    Minutes per session: Not on file  . Stress: Not on file  Relationships  . Social connections:    Talks on phone: Not on file    Gets together: Not  on file    Attends religious service: Not on file    Active member of club or organization: Not on file    Attends meetings of clubs or organizations: Not on file    Relationship status: Not on file  Other Topics Concern  . Not on file  Social History Narrative  . Not on file    Outpatient Encounter Medications as of 06/28/2017  Medication Sig  . amLODipine (NORVASC) 5 MG tablet TAKE 1 TABLET BY MOUTH DAILY  . CONTOUR NEXT TEST test strip TEST TWICE DAILY  . dorzolamide-timolol (COSOPT) 22.3-6.8 MG/ML ophthalmic solution   . lisinopril (PRINIVIL,ZESTRIL) 10 MG tablet TAKE 1 TABLET BY MOUTH DAILY  . metFORMIN (GLUCOPHAGE) 500 MG tablet TAKE ONE TABLET BY MOUTH TWICE DAILY WITH A MEAL  . timolol (BETIMOL) 0.25 % ophthalmic solution 1-2 drops 2 (two) times daily.  Marland Kitchen triamcinolone cream (KENALOG) 0.1 % APPLY TO AFFECTED AREAS EVERY DAY AS NEEDED  . triamterene-hydrochlorothiazide (DYAZIDE) 37.5-25 MG capsule TAKE 1 CAPSULE BY MOUTH EVERY MORNING  . [  DISCONTINUED] doxycycline (VIBRA-TABS) 100 MG tablet Take 1 tablet (100 mg total) by mouth 2 (two) times daily.   No facility-administered encounter medications on file as of 06/28/2017.     Review of Systems  Constitutional: Negative for appetite change and unexpected weight change.  HENT: Negative for congestion and sinus pressure.   Respiratory: Negative for cough, chest tightness and shortness of breath.   Cardiovascular: Negative for chest pain, palpitations and leg swelling.  Gastrointestinal: Negative for abdominal pain, diarrhea, nausea and vomiting.  Genitourinary: Negative for difficulty urinating and dysuria.  Musculoskeletal: Negative for joint swelling and myalgias.  Skin: Negative for color change and rash.  Neurological: Negative for dizziness, light-headedness and headaches.  Psychiatric/Behavioral: Negative for agitation and dysphoric mood.       Objective:    Physical Exam  Constitutional: She appears well-developed  and well-nourished. No distress.  HENT:  Nose: Nose normal.  Mouth/Throat: Oropharynx is clear and moist.  Neck: Neck supple. No thyromegaly present.  Cardiovascular: Normal rate and regular rhythm.  Pulmonary/Chest: Breath sounds normal. No respiratory distress. She has no wheezes.  Abdominal: Soft. Bowel sounds are normal. There is no tenderness.  Musculoskeletal: She exhibits no edema or tenderness.  Lymphadenopathy:    She has no cervical adenopathy.  Skin: No rash noted. No erythema.  Psychiatric: She has a normal mood and affect. Her behavior is normal.    BP 126/74 (BP Location: Left Arm, Patient Position: Sitting, Cuff Size: Large)   Pulse 80   Temp 98.3 F (36.8 C) (Oral)   Resp 18   Wt 185 lb 3.2 oz (84 kg)   LMP 02/26/2008   SpO2 97%   BMI 33.87 kg/m  Wt Readings from Last 3 Encounters:  06/28/17 185 lb 3.2 oz (84 kg)  03/23/17 179 lb (81.2 kg)  09/06/16 190 lb (86.2 kg)     Lab Results  Component Value Date   WBC 6.0 03/23/2017   HGB 13.9 03/23/2017   HCT 42.1 03/23/2017   PLT 223.0 03/23/2017   GLUCOSE 132 (H) 03/23/2017   CHOL 155 03/23/2017   TRIG 144.0 03/23/2017   HDL 44.10 03/23/2017   LDLDIRECT 116.5 04/08/2014   LDLCALC 82 03/23/2017   ALT 18 03/23/2017   AST 16 03/23/2017   NA 141 03/23/2017   K 4.1 03/23/2017   CL 105 03/23/2017   CREATININE 0.80 03/23/2017   BUN 18 03/23/2017   CO2 28 03/23/2017   TSH 0.55 03/23/2017   HGBA1C 6.3 03/23/2017   MICROALBUR 0.7 03/23/2017       Assessment & Plan:   Problem List Items Addressed This Visit    Diabetes mellitus ([place])    Sugars as outlined.  Low carb diet and exercise.  Follow met b and a1c.        Relevant Orders   Hemoglobin A1c   Hepatic function panel   Lipid panel   Basic metabolic panel   Hypertension    Blood pressure under good control.  Continue same medication regimen.  Follow pressures.  Follow metabolic panel.        Obesity (BMI 30-39.9)    Discussed diet and  exercise.  She is riding her bike.  Follow.       Stress    Handling stress ok.  Follow.            Einar Pheasant, MD

## 2017-07-03 ENCOUNTER — Encounter: Payer: Self-pay | Admitting: Internal Medicine

## 2017-07-03 NOTE — Assessment & Plan Note (Signed)
Blood pressure under good control.  Continue same medication regimen.  Follow pressures.  Follow metabolic panel.   

## 2017-07-03 NOTE — Assessment & Plan Note (Signed)
Handling stress ok.  Follow.

## 2017-07-03 NOTE — Assessment & Plan Note (Signed)
Discussed diet and exercise.  She is riding her bike.  Follow.

## 2017-07-03 NOTE — Assessment & Plan Note (Signed)
Sugars as outlined.  Low carb diet and exercise.  Follow met b and a1c.

## 2017-07-26 ENCOUNTER — Other Ambulatory Visit: Payer: Self-pay | Admitting: Internal Medicine

## 2017-07-26 ENCOUNTER — Other Ambulatory Visit (INDEPENDENT_AMBULATORY_CARE_PROVIDER_SITE_OTHER): Payer: BLUE CROSS/BLUE SHIELD

## 2017-07-26 DIAGNOSIS — E119 Type 2 diabetes mellitus without complications: Secondary | ICD-10-CM | POA: Diagnosis not present

## 2017-07-26 LAB — LIPID PANEL
CHOL/HDL RATIO: 4
Cholesterol: 170 mg/dL (ref 0–200)
HDL: 44.6 mg/dL (ref >39.00)
LDL Cholesterol: 93 mg/dL (ref 0–99)
NONHDL: 125.01
Triglycerides: 160 mg/dL — ABNORMAL HIGH (ref 0.0–149.0)
VLDL: 32 mg/dL (ref 0.0–40.0)

## 2017-07-26 LAB — BASIC METABOLIC PANEL
BUN: 18 mg/dL (ref 6–23)
CALCIUM: 9.1 mg/dL (ref 8.4–10.5)
CO2: 31 mEq/L (ref 19–32)
CREATININE: 0.79 mg/dL (ref 0.40–1.20)
Chloride: 104 mEq/L (ref 96–112)
GFR: 78.44 mL/min (ref >60.00)
GLUCOSE: 148 mg/dL — AB (ref 70–99)
Potassium: 4.3 mEq/L (ref 3.5–5.1)
SODIUM: 139 meq/L (ref 135–145)

## 2017-07-26 LAB — HEPATIC FUNCTION PANEL
ALBUMIN: 4.1 g/dL (ref 3.5–5.2)
ALK PHOS: 67 U/L (ref 39–117)
ALT: 26 U/L (ref 0–35)
AST: 20 U/L (ref 0–37)
BILIRUBIN TOTAL: 0.4 mg/dL (ref 0.2–1.2)
Bilirubin, Direct: 0.1 mg/dL (ref 0.0–0.3)
Total Protein: 6.8 g/dL (ref 6.0–8.3)

## 2017-07-26 LAB — HEMOGLOBIN A1C: Hgb A1c MFr Bld: 6.6 % — ABNORMAL HIGH (ref 4.6–6.5)

## 2017-07-27 ENCOUNTER — Encounter: Payer: Self-pay | Admitting: Internal Medicine

## 2017-08-24 ENCOUNTER — Other Ambulatory Visit: Payer: Self-pay | Admitting: Internal Medicine

## 2017-09-23 ENCOUNTER — Other Ambulatory Visit: Payer: Self-pay | Admitting: Internal Medicine

## 2017-09-23 DIAGNOSIS — Z1231 Encounter for screening mammogram for malignant neoplasm of breast: Secondary | ICD-10-CM

## 2017-10-16 ENCOUNTER — Emergency Department
Admission: EM | Admit: 2017-10-16 | Discharge: 2017-10-16 | Disposition: A | Discharge status: Home / Self Care | Payer: BLUE CROSS/BLUE SHIELD | Attending: Emergency Medicine | Admitting: Emergency Medicine

## 2017-10-16 ENCOUNTER — Emergency Department: Payer: BLUE CROSS/BLUE SHIELD

## 2017-10-16 ENCOUNTER — Other Ambulatory Visit: Payer: Self-pay

## 2017-10-16 DIAGNOSIS — Z79899 Other long term (current) drug therapy: Secondary | ICD-10-CM | POA: Insufficient documentation

## 2017-10-16 DIAGNOSIS — Y999 Unspecified external cause status: Secondary | ICD-10-CM | POA: Diagnosis not present

## 2017-10-16 DIAGNOSIS — I10 Essential (primary) hypertension: Secondary | ICD-10-CM | POA: Insufficient documentation

## 2017-10-16 DIAGNOSIS — Y939 Activity, unspecified: Secondary | ICD-10-CM | POA: Insufficient documentation

## 2017-10-16 DIAGNOSIS — S8991XA Unspecified injury of right lower leg, initial encounter: Secondary | ICD-10-CM | POA: Diagnosis present

## 2017-10-16 DIAGNOSIS — Z7984 Long term (current) use of oral hypoglycemic drugs: Secondary | ICD-10-CM | POA: Insufficient documentation

## 2017-10-16 DIAGNOSIS — E119 Type 2 diabetes mellitus without complications: Secondary | ICD-10-CM | POA: Insufficient documentation

## 2017-10-16 DIAGNOSIS — Y929 Unspecified place or not applicable: Secondary | ICD-10-CM | POA: Diagnosis not present

## 2017-10-16 DIAGNOSIS — M25561 Pain in right knee: Secondary | ICD-10-CM | POA: Insufficient documentation

## 2017-10-16 DIAGNOSIS — X509XXA Other and unspecified overexertion or strenuous movements or postures, initial encounter: Secondary | ICD-10-CM | POA: Insufficient documentation

## 2017-10-16 HISTORY — DX: Strain of right Achilles tendon, initial encounter: S86.011A

## 2017-10-16 MED ORDER — ETODOLAC 400 MG PO TABS: 400.0000 mg | ORAL_TABLET | Freq: Two times a day (BID) | ORAL | 0 refills | Status: DC

## 2017-10-16 NOTE — ED Notes (Signed)
See triage note  States she is here for right knee discomfort since yesterday  States she stepped down wrong yesterday

## 2017-10-16 NOTE — ED Provider Notes (Signed)
[facility] Emergency Department Provider Note   ____________________________________________   First MD Initiated Contact with Patient 10/16/17 0801     (approximate)  I have reviewed the triage vital signs and the nursing notes.   HISTORY  Chief Complaint Knee Pain   HPI April Solis is a 62 y.o. female is here with complaint of right knee pain.  Patient states that she was at a conference and was coming down some steps yesterday when she stepped down and had pain.  Patient denies an actual known injury.  She states that she is not able to weight-bear without pain.  She is taken some over-the-counter medication without any relief.  She denies any previous injury to her knee.  Rates her pain is 1/10.   Past Medical History:  Diagnosis Date  . Achilles rupture, right   . Diabetes mellitus without complication ([place])   . Environmental allergies   . Glaucoma   . History of chicken pox   . Hypertension     Patient Active Problem List   Diagnosis Date Noted  . Scalp irritation 03/23/2017  . Health care maintenance 08/11/2014  . Stress 04/09/2014  . Obesity (BMI 30-39.9) 09/09/2013  . Glaucoma 04/28/2012  . Hypertension 04/28/2012  . Diabetes mellitus ([place]) 04/28/2012    Past Surgical History:  Procedure Laterality Date  . BREAST BIOPSY Right 1999   neg    Prior to Admission medications   Medication Sig Start Date End Date Taking? Authorizing Provider  amLODipine (NORVASC) 5 MG tablet TAKE ONE TABLET EVERY DAY 07/26/17   Einar Pheasant, MD  CONTOUR NEXT TEST test strip TEST TWICE DAILY 05/02/17   Einar Pheasant, MD  dorzolamide-timolol (COSOPT) 22.3-6.8 MG/ML ophthalmic solution  07/05/14   [provider]  etodolac (LODINE) 400 MG tablet Take 1 tablet (400 mg total) by mouth 2 (two) times daily. 10/16/17   Johnn Hai, PA-C  lisinopril (PRINIVIL,ZESTRIL) 10 MG tablet TAKE ONE TABLET EVERY DAY 07/26/17   Einar Pheasant,  MD  metFORMIN (GLUCOPHAGE) 500 MG tablet TAKE 1 TABLET BY MOUTH TWICE DAILY WITH A MEAL 08/25/17   Einar Pheasant, MD  timolol (BETIMOL) 0.25 % ophthalmic solution 1-2 drops 2 (two) times daily.    [provider]  triamcinolone cream (KENALOG) 0.1 % APPLY TO AFFECTED AREAS EVERY DAY AS NEEDED 11/26/16   Einar Pheasant, MD  triamterene-hydrochlorothiazide (DYAZIDE) 37.5-25 MG capsule TAKE 1 CAPSULE EVERY MORNING 07/26/17   Einar Pheasant, MD    Allergies Influenza vaccines; Rubbing alcohol [alcohol]; and Other  Family History  Problem Relation Age of Onset  . Heart disease Father   . Hypertension Father   . Diabetes Father   . Diabetes Sister        x2  . Colon polyps Mother   . Breast cancer Neg Hx   . Colon cancer Neg Hx     Social History Social History   Tobacco Use  . Smoking status: Never Smoker  . Smokeless tobacco: Never Used  Substance Use Topics  . Alcohol use: No    Alcohol/week: 0.0 oz  . Drug use: No    Review of Systems Constitutional: No fever/chills Cardiovascular: Denies chest pain. Respiratory: Denies shortness of breath. Musculoskeletal: Positive for right knee pain. Skin: Negative for rash. Neurological: Negative for  focal weakness or numbness. ____________________________________________   PHYSICAL EXAM:  VITAL SIGNS: ED Triage Vitals  Enc Vitals Group     BP 10/16/17 0747 122/74  Pulse Rate 10/16/17 0746 65     Resp 10/16/17 0746 18     Temp 10/16/17 0746 98.2 F (36.8 C)     Temp Source 10/16/17 0746 Oral     SpO2 10/16/17 0746 96 %     Weight 10/16/17 0744 183 lb (83 kg)     Height 10/16/17 0744 5\' 2"  (1.575 m)     Head Circumference --      Peak Flow --      Pain Score 10/16/17 0744 1     Pain Loc --      Pain Edu? --      Excl. in [place]? --    Constitutional: Alert and oriented. Well appearing and in no acute distress. Eyes: Conjunctivae are normal.  Head: Atraumatic. Neck: No stridor.   Cardiovascular: Normal  rate, regular rhythm. Grossly normal heart sounds.  Good peripheral circulation. Respiratory: Normal respiratory effort.  No retractions. Lungs CTAB. Musculoskeletal: Examination of the right knee there is no gross deformity however there is marked tenderness on palpation of the medial aspect.  There is no effusion present.  Crepitus noted.  Range of motion is restricted secondary to discomfort.  Skin is intact.  Medial collateral ligament is lax with stress. Neurologic:  Normal speech and language. No gross focal neurologic deficits are appreciated. No gait instability. Skin:  Skin is warm, dry and intact. No rash noted. Psychiatric: Mood and affect are normal. Speech and behavior are normal.  ____________________________________________   LABS (all labs ordered are listed, but only abnormal results are displayed)  Labs Reviewed - No data to display   RADIOLOGY  ED MD interpretation:   Right knee negative for acute injury.  Osteoarthritis noted.  Official radiology report(s): Dg Knee Complete 4 Views Right  Result Date: 10/16/2017 CLINICAL DATA:  Pain in knee. EXAM: RIGHT KNEE - COMPLETE 4+ VIEW COMPARISON:  No joint effusion. FINDINGS: There is moderate tricompartment osteoarthritis with joint space narrowing, marginal spur formation, and sharpening the tibial spines. No fractures or dislocations identified. IMPRESSION: 1. No acute findings. 2. Moderate tricompartment osteoarthritis. Electronically Signed   By: Kerby Moors M.D.   On: 10/16/2017 08:37  ____________________________________________   PROCEDURES  Procedure(s) performed:   .Splint Application Date/Time: 09/22/5091 4:40 PM Performed by: Loma Boston, NT Authorized by: Johnn Hai, PA-C   Consent:    Consent obtained:  Verbal   Consent given by:  Patient   Risks discussed:  Pain   Alternatives discussed:  Referral Pre-procedure details:    Sensation:  Normal Procedure details:    Laterality:  Right    Location:  Knee   Knee:  R knee   Strapping: yes     Splint type:  Knee immobilizer   Supplies:  Prefabricated splint Post-procedure details:    Pain:  Improved   Sensation:  Normal   Patient tolerance of procedure:  Tolerated well, no immediate complications   Critical Care performed: No  ____________________________________________   INITIAL IMPRESSION / ASSESSMENT AND PLAN / ED COURSE  As part of my medical decision making, I reviewed the following data within the electronic MEDICAL RECORD NUMBER Notes from prior ED visits and [place] Controlled Substance Database  Patient is to discontinue taking her Bayer back and body and she was given a prescription for etodolac 400 mg twice daily with food.  She is to ice and elevate her leg as needed for swelling and pain.  She is advised to wear the knee immobilizer when walking  but does not need to wear it while sleeping.  She will follow-up with [facility] as she has been seen by them before if she is not improving.  ____________________________________________   FINAL CLINICAL IMPRESSION(S) / ED DIAGNOSES  Final diagnoses:  Acute pain of right knee     ED Discharge Orders        Ordered    etodolac (LODINE) 400 MG tablet  2 times daily     10/16/17 0853       Note:  This document was prepared using Dragon voice recognition software and may include unintentional dictation errors.    Johnn Hai, PA-C 10/16/17 1642    Schuyler Amor, MD 10/21/17 [phone]

## 2017-10-16 NOTE — Discharge Instructions (Addendum)
Ice and elevate as needed for pain or swelling.  Wear knee immobilizer for added support and protection. Begin taking etodolac 400 mg twice daily with food. Follow-up with the orthopedist listed on your discharge papers or orthopedist of choice if not improving in 1 week.

## 2017-10-16 NOTE — ED Triage Notes (Signed)
Pt states was walking at yesterday on some stairs. States stepped down and has R knee pain since. States doesn't hurt while not bearing weight. Alert, oriented, ambulatory. Denies falling, just stepped wrong.

## 2017-10-24 ENCOUNTER — Other Ambulatory Visit: Payer: Self-pay | Admitting: Internal Medicine

## 2017-10-28 ENCOUNTER — Ambulatory Visit: Payer: BLUE CROSS/BLUE SHIELD | Admitting: Internal Medicine

## 2017-10-28 ENCOUNTER — Encounter: Payer: Self-pay | Admitting: Internal Medicine

## 2017-10-28 VITALS — BP 118/70 | HR 81 | Temp 98.3°F | Resp 18 | Wt 186.8 lb

## 2017-10-28 DIAGNOSIS — M25561 Pain in right knee: Secondary | ICD-10-CM | POA: Diagnosis not present

## 2017-10-28 DIAGNOSIS — E669 Obesity, unspecified: Secondary | ICD-10-CM

## 2017-10-28 DIAGNOSIS — E119 Type 2 diabetes mellitus without complications: Secondary | ICD-10-CM | POA: Diagnosis not present

## 2017-10-28 DIAGNOSIS — I10 Essential (primary) hypertension: Secondary | ICD-10-CM | POA: Diagnosis not present

## 2017-10-28 NOTE — Progress Notes (Signed)
[name], female   DOB: [birth year], 62 y.o.   MRN: [id number]   Subjective:    [name], female    DOB: [birth year], 62 y.o.   MRN: [id number]  HPI  Patient here for a scheduled follow up.  She was seen recently for acute pain of right knee.  Was evaluated at urgent care.  Given rx for etodolac.  Did not get filled.  She reports her knee is better.  No significant pain now.  Xray revealed changes c/w OA.  Tries to stay active.  Working part time.  States was given a note to remain out of work until end of July. Is due to return to work 11/07/17 and request a note stating she may return.  No chest pain. No sob.  No acid reflux.  No abdominal pain.  Bowels moving.  Reviewed blood sugars.  Most sugars averaging 140-160.  States higher in am.     Past Medical History:  Diagnosis Date  . Achilles rupture, right   . Diabetes mellitus without complication ([place])   . Environmental allergies   . Glaucoma   . History of chicken pox   . Hypertension    Past Surgical History:  Procedure Laterality Date  . BREAST BIOPSY Right 1999   neg   Family History  Problem Relation Age of Onset  . Heart disease Father   . Hypertension Father   . Diabetes Father   . Diabetes Sister        x2  . Colon polyps Mother   . Breast cancer Neg Hx   . Colon cancer Neg Hx    Social History   Socioeconomic History  . Marital status: Married    Spouse name: Not on file  . Number of children: 3  . Years of education: Not on file  . Highest education level: Not on file  Occupational History    Employer: [employer]  . Financial resource strain: Not on file  . Food insecurity:    Worry: Not on file    Inability: Not on file  . Transportation needs:    Medical: Not on file    Non-medical: Not on file  Tobacco Use  . Smoking status: Never Smoker  . Smokeless tobacco: Never Used  Substance and Sexual Activity  . Alcohol use: No    Alcohol/week:  0.0 oz  . Drug use: No  . Sexual activity: Not on file  Lifestyle  . Physical activity:    Days per week: Not on file    Minutes per session: Not on file  . Stress: Not on file  Relationships  . Social connections:    Talks on phone: Not on file    Gets together: Not on file    Attends religious service: Not on file    Active member of club or organization: Not on file    Attends meetings of clubs or organizations: Not on file    Relationship status: Not on file  Other Topics Concern  . Not on file  Social History Narrative  . Not on file    Outpatient Encounter Medications as of 10/28/2017  Medication Sig  . amLODipine (NORVASC) 5 MG tablet TAKE 1 TABLET BY MOUTH DAILY  . CONTOUR NEXT TEST test strip TEST TWICE DAILY  . dorzolamide-timolol (COSOPT) 22.3-6.8 MG/ML ophthalmic solution   . lisinopril (PRINIVIL,ZESTRIL) 10 MG tablet TAKE 1 TABLET BY MOUTH DAILY  . metFORMIN (GLUCOPHAGE) 500  MG tablet TAKE 1 TABLET BY MOUTH TWICE DAILY WITH A MEAL  . timolol (BETIMOL) 0.25 % ophthalmic solution 1-2 drops 2 (two) times daily.  Marland Kitchen triamcinolone cream (KENALOG) 0.1 % APPLY TO AFFECTED AREAS EVERY DAY AS NEEDED  . triamterene-hydrochlorothiazide (DYAZIDE) 37.5-25 MG capsule TAKE 1 CAPSULE BY MOUTH EACH MORNING  . [DISCONTINUED] etodolac (LODINE) 400 MG tablet Take 1 tablet (400 mg total) by mouth 2 (two) times daily.   No facility-administered encounter medications on file as of 10/28/2017.     Review of Systems  Constitutional: Negative for appetite change and unexpected weight change.  HENT: Negative for congestion and sinus pressure.   Respiratory: Negative for cough, chest tightness and shortness of breath.   Cardiovascular: Negative for chest pain, palpitations and leg swelling.  Gastrointestinal: Negative for abdominal pain, diarrhea, nausea and vomiting.  Genitourinary: Negative for difficulty urinating and dysuria.  Musculoskeletal: Negative for myalgias.       Knee is  better.    Skin: Negative for color change and rash.  Neurological: Negative for dizziness, light-headedness and headaches.  Psychiatric/Behavioral: Negative for agitation and dysphoric mood.       Objective:    Physical Exam  Constitutional: She appears well-developed and well-nourished. No distress.  HENT:  Nose: Nose normal.  Mouth/Throat: Oropharynx is clear and moist.  Neck: Neck supple. No thyromegaly present.  Cardiovascular: Normal rate and regular rhythm.  Pulmonary/Chest: Breath sounds normal. No respiratory distress. She has no wheezes.  Abdominal: Soft. Bowel sounds are normal. There is no tenderness.  Musculoskeletal: She exhibits no edema or tenderness.  No pain in knee with resistance against attempts at flexion or extension of leg.    Lymphadenopathy:    She has no cervical adenopathy.  Skin: No rash noted. No erythema.  Psychiatric: She has a normal mood and affect. Her behavior is normal.    BP 118/70 (BP Location: Left Arm, Patient Position: Sitting, Cuff Size: Large)   Pulse 81   Temp 98.3 F (36.8 C) (Oral)   Resp 18   Wt 186 lb 12.8 oz (84.7 kg)   LMP 02/26/2008   SpO2 98%   BMI 34.17 kg/m  Wt Readings from Last 3 Encounters:  10/28/17 186 lb 12.8 oz (84.7 kg)  10/16/17 183 lb (83 kg)  06/28/17 185 lb 3.2 oz (84 kg)     Lab Results  Component Value Date   WBC 6.0 03/23/2017   HGB 13.9 03/23/2017   HCT 42.1 03/23/2017   PLT 223.0 03/23/2017   GLUCOSE 148 (H) 07/26/2017   CHOL 170 07/26/2017   TRIG 160.0 (H) 07/26/2017   HDL 44.60 07/26/2017   LDLDIRECT 116.5 04/08/2014   LDLCALC 93 07/26/2017   ALT 26 07/26/2017   AST 20 07/26/2017   NA 139 07/26/2017   K 4.3 07/26/2017   CL 104 07/26/2017   CREATININE 0.79 07/26/2017   BUN 18 07/26/2017   CO2 31 07/26/2017   TSH 0.55 03/23/2017   HGBA1C 6.6 (H) 07/26/2017   MICROALBUR 0.7 03/23/2017    Dg Knee Complete 4 Views Right  Result Date: 10/16/2017 CLINICAL DATA:  Pain in knee. EXAM:  RIGHT KNEE - COMPLETE 4+ VIEW COMPARISON:  No joint effusion. FINDINGS: There is moderate tricompartment osteoarthritis with joint space narrowing, marginal spur formation, and sharpening the tibial spines. No fractures or dislocations identified. IMPRESSION: 1. No acute findings. 2. Moderate tricompartment osteoarthritis. Electronically Signed   By: Kerby Moors M.D.   On: 10/16/2017 08:37  Assessment & Plan:   Problem List Items Addressed This Visit    Diabetes mellitus ([place])    Sugars reviewed.  Low carb diet and exercise.  Follow met b and a1c.        Relevant Orders   Hepatic function panel   Hemoglobin A1c   Lipid panel   Basic metabolic panel   Hypertension    Blood pressure under good control.  Continue same medication regimen.  Follow pressures.  Follow metabolic panel.        Obesity (BMI 30-39.9)    Discussed diet and exercise.  Follow met b and a1c.         Other Visit Diagnoses    Right knee pain, unspecified chronicity    -  Primary   Seen in UC.  Xray with OA.  Discussed with her today.  Pain - better.  Not a significant problem for her now.  Will notify me if problems.         Einar Pheasant, MD

## 2017-10-31 ENCOUNTER — Encounter: Payer: Self-pay | Admitting: Internal Medicine

## 2017-10-31 NOTE — Assessment & Plan Note (Signed)
Sugars reviewed.  Low carb diet and exercise.  Follow met b and a1c.

## 2017-10-31 NOTE — Assessment & Plan Note (Signed)
Blood pressure under good control.  Continue same medication regimen.  Follow pressures.  Follow metabolic panel.   

## 2017-10-31 NOTE — Assessment & Plan Note (Signed)
Discussed diet and exercise.  Follow met b and a1c.   

## 2017-11-21 ENCOUNTER — Other Ambulatory Visit: Payer: Self-pay | Admitting: Internal Medicine

## 2017-11-24 ENCOUNTER — Other Ambulatory Visit: Payer: Self-pay | Admitting: Internal Medicine

## 2017-12-07 ENCOUNTER — Other Ambulatory Visit (INDEPENDENT_AMBULATORY_CARE_PROVIDER_SITE_OTHER): Payer: BLUE CROSS/BLUE SHIELD

## 2017-12-07 DIAGNOSIS — E119 Type 2 diabetes mellitus without complications: Secondary | ICD-10-CM | POA: Diagnosis not present

## 2017-12-07 LAB — LIPID PANEL
CHOL/HDL RATIO: 4
CHOLESTEROL: 163 mg/dL (ref 0–200)
HDL: 37.7 mg/dL — ABNORMAL LOW (ref >39.00)
NonHDL: 124.83
TRIGLYCERIDES: 223 mg/dL — AB (ref 0.0–149.0)
VLDL: 44.6 mg/dL — ABNORMAL HIGH (ref 0.0–40.0)

## 2017-12-07 LAB — BASIC METABOLIC PANEL
BUN: 19 mg/dL (ref 6–23)
CHLORIDE: 102 meq/L (ref 96–112)
CO2: 28 mEq/L (ref 19–32)
CREATININE: 0.92 mg/dL (ref 0.40–1.20)
Calcium: 9.1 mg/dL (ref 8.4–10.5)
GFR: 65.72 mL/min (ref >60.00)
Glucose, Bld: 173 mg/dL — ABNORMAL HIGH (ref 70–99)
POTASSIUM: 4.1 meq/L (ref 3.5–5.1)
Sodium: 137 mEq/L (ref 135–145)

## 2017-12-07 LAB — HEPATIC FUNCTION PANEL
ALT: 37 U/L — ABNORMAL HIGH (ref 0–35)
AST: 27 U/L (ref 0–37)
Albumin: 4.1 g/dL (ref 3.5–5.2)
Alkaline Phosphatase: 56 U/L (ref 39–117)
BILIRUBIN DIRECT: 0.1 mg/dL (ref 0.0–0.3)
TOTAL PROTEIN: 6.9 g/dL (ref 6.0–8.3)
Total Bilirubin: 0.6 mg/dL (ref 0.2–1.2)

## 2017-12-07 LAB — HEMOGLOBIN A1C: Hgb A1c MFr Bld: 7 % — ABNORMAL HIGH (ref 4.6–6.5)

## 2017-12-07 LAB — LDL CHOLESTEROL, DIRECT: Direct LDL: 100 mg/dL

## 2017-12-08 ENCOUNTER — Other Ambulatory Visit: Payer: Self-pay | Admitting: Internal Medicine

## 2017-12-08 DIAGNOSIS — R7989 Other specified abnormal findings of blood chemistry: Secondary | ICD-10-CM

## 2017-12-08 DIAGNOSIS — R945 Abnormal results of liver function studies: Secondary | ICD-10-CM

## 2017-12-08 NOTE — Progress Notes (Signed)
Order placed for f/u liver panel.  

## 2017-12-09 ENCOUNTER — Encounter: Payer: Self-pay | Admitting: *Deleted

## 2017-12-12 ENCOUNTER — Encounter
Admission: RE | Disposition: A | Discharge status: Home / Self Care | Payer: Self-pay | Source: Ambulatory Visit | Attending: Unknown Physician Specialty

## 2017-12-12 ENCOUNTER — Ambulatory Visit: Payer: BLUE CROSS/BLUE SHIELD | Admitting: Anesthesiology

## 2017-12-12 ENCOUNTER — Ambulatory Visit
Admission: RE | Admit: 2017-12-12 | Discharge: 2017-12-12 | Disposition: A | Discharge status: Home / Self Care | Payer: BLUE CROSS/BLUE SHIELD | Source: Ambulatory Visit | Attending: Unknown Physician Specialty | Admitting: Unknown Physician Specialty

## 2017-12-12 DIAGNOSIS — E1139 Type 2 diabetes mellitus with other diabetic ophthalmic complication: Secondary | ICD-10-CM | POA: Insufficient documentation

## 2017-12-12 DIAGNOSIS — I10 Essential (primary) hypertension: Secondary | ICD-10-CM | POA: Insufficient documentation

## 2017-12-12 DIAGNOSIS — Z79899 Other long term (current) drug therapy: Secondary | ICD-10-CM | POA: Insufficient documentation

## 2017-12-12 DIAGNOSIS — Z7982 Long term (current) use of aspirin: Secondary | ICD-10-CM | POA: Insufficient documentation

## 2017-12-12 DIAGNOSIS — K5730 Diverticulosis of large intestine without perforation or abscess without bleeding: Secondary | ICD-10-CM | POA: Insufficient documentation

## 2017-12-12 DIAGNOSIS — Z8249 Family history of ischemic heart disease and other diseases of the circulatory system: Secondary | ICD-10-CM | POA: Insufficient documentation

## 2017-12-12 DIAGNOSIS — K640 First degree hemorrhoids: Secondary | ICD-10-CM | POA: Diagnosis not present

## 2017-12-12 DIAGNOSIS — K635 Polyp of colon: Secondary | ICD-10-CM | POA: Diagnosis not present

## 2017-12-12 DIAGNOSIS — Z1211 Encounter for screening for malignant neoplasm of colon: Secondary | ICD-10-CM | POA: Insufficient documentation

## 2017-12-12 DIAGNOSIS — Z8371 Family history of colonic polyps: Secondary | ICD-10-CM | POA: Insufficient documentation

## 2017-12-12 DIAGNOSIS — Z888 Allergy status to other drugs, medicaments and biological substances status: Secondary | ICD-10-CM | POA: Insufficient documentation

## 2017-12-12 DIAGNOSIS — H42 Glaucoma in diseases classified elsewhere: Secondary | ICD-10-CM | POA: Insufficient documentation

## 2017-12-12 DIAGNOSIS — Z887 Allergy status to serum and vaccine status: Secondary | ICD-10-CM | POA: Insufficient documentation

## 2017-12-12 DIAGNOSIS — Z7984 Long term (current) use of oral hypoglycemic drugs: Secondary | ICD-10-CM | POA: Insufficient documentation

## 2017-12-12 HISTORY — PX: COLONOSCOPY WITH PROPOFOL: SHX5780

## 2017-12-12 LAB — HM COLONOSCOPY

## 2017-12-12 LAB — GLUCOSE, CAPILLARY: Glucose-Capillary: 140 mg/dL — ABNORMAL HIGH (ref 70–99)

## 2017-12-12 SURGERY — COLONOSCOPY WITH PROPOFOL
Anesthesia: General

## 2017-12-12 MED ORDER — SODIUM CHLORIDE 0.9 % IV SOLN
INTRAVENOUS | Status: DC
  Administered 2017-12-12: 1000 mL via INTRAVENOUS

## 2017-12-12 MED ORDER — PROPOFOL 500 MG/50ML IV EMUL
INTRAVENOUS | Status: AC
  Filled 2017-12-12: qty 50

## 2017-12-12 MED ORDER — LIDOCAINE HCL (PF) 1 % IJ SOLN
INTRAMUSCULAR | Status: AC
  Administered 2017-12-12: 0.3 mL via INTRADERMAL
  Filled 2017-12-12: qty 2

## 2017-12-12 MED ORDER — MIDAZOLAM HCL 2 MG/2ML IJ SOLN
INTRAMUSCULAR | Status: AC
  Filled 2017-12-12: qty 2

## 2017-12-12 MED ORDER — LIDOCAINE HCL (PF) 2 % IJ SOLN
INTRAMUSCULAR | Status: AC
  Filled 2017-12-12: qty 10

## 2017-12-12 MED ORDER — FENTANYL CITRATE (PF) 100 MCG/2ML IJ SOLN
INTRAMUSCULAR | Status: DC | PRN
  Administered 2017-12-12 (×2): 50 ug via INTRAVENOUS

## 2017-12-12 MED ORDER — LIDOCAINE HCL (PF) 2 % IJ SOLN
INTRAMUSCULAR | Status: DC | PRN
  Administered 2017-12-12: 80 mg

## 2017-12-12 MED ORDER — PROPOFOL 10 MG/ML IV BOLUS
INTRAVENOUS | Status: DC | PRN
  Administered 2017-12-12: 20 mg via INTRAVENOUS
  Administered 2017-12-12: 10 mg via INTRAVENOUS
  Administered 2017-12-12: 20 mg via INTRAVENOUS

## 2017-12-12 MED ORDER — FENTANYL CITRATE (PF) 100 MCG/2ML IJ SOLN
INTRAMUSCULAR | Status: AC
  Filled 2017-12-12: qty 2

## 2017-12-12 MED ORDER — LIDOCAINE HCL (PF) 1 % IJ SOLN
2.0000 mL | Freq: Once | INTRAMUSCULAR | Status: AC
  Administered 2017-12-12: 0.3 mL via INTRADERMAL

## 2017-12-12 MED ORDER — SODIUM CHLORIDE 0.9 % IV SOLN: INTRAVENOUS | Status: DC

## 2017-12-12 MED ORDER — MIDAZOLAM HCL 5 MG/5ML IJ SOLN
INTRAMUSCULAR | Status: DC | PRN
  Administered 2017-12-12: 2 mg via INTRAVENOUS

## 2017-12-12 MED ORDER — PROPOFOL 500 MG/50ML IV EMUL
INTRAVENOUS | Status: DC | PRN
  Administered 2017-12-12: 50 ug/kg/min via INTRAVENOUS

## 2017-12-12 NOTE — Anesthesia Post-op Follow-up Note (Signed)
Anesthesia QCDR form completed.        

## 2017-12-12 NOTE — Op Note (Signed)
[facility] Patient Name: April Solis Procedure Date: 12/12/2017 8:53 AM MRN: [id number] Account #: [id number] Date of Birth: 06/01/1955 Admit Type: Outpatient Age: 62 Room: [facility] ENDO ROOM 1 Gender: Female Note Status: Finalized Procedure:            Colonoscopy Indications:          Colon cancer screening in patient at increased risk:                        Family history of 1st-degree relative with colon polyps Providers:            Manya Silvas, MD Referring MD:         Einar Pheasant, MD (Referring MD) Medicines:            Propofol per Anesthesia Complications:        No immediate complications. Procedure:            Pre-Anesthesia Assessment:                       - After reviewing the risks and benefits, the patient                        was deemed in satisfactory condition to undergo the                        procedure.                       After obtaining informed consent, the colonoscope was                        passed under direct vision. Throughout the procedure,                        the patient's blood pressure, pulse, and oxygen                        saturations were monitored continuously. The                        Colonoscope was introduced through the anus and                        advanced to the the cecum, identified by appendiceal                        orifice and ileocecal valve. The colonoscopy was                        performed without difficulty. The patient tolerated the                        procedure well. The quality of the bowel preparation                        was good. Findings:      A diminutive polyp was found in the sigmoid colon. The polyp was       sessile. The polyp was removed with a jumbo cold forceps. Resection and       retrieval were complete.  A few small-mouthed diverticula were found in the sigmoid colon.      Internal hemorrhoids were found during endoscopy. The hemorrhoids  were       small and Grade I (internal hemorrhoids that do not prolapse).      The exam was otherwise without abnormality. Impression:           - One diminutive polyp in the sigmoid colon, removed                        with a jumbo cold forceps. Resected and retrieved.                       - Diverticulosis in the sigmoid colon.                       - Internal hemorrhoids.                       - The examination was otherwise normal. Recommendation:       - Await pathology results. Manya Silvas, MD 12/12/2017 9:26:45 AM This report has been signed electronically. Number of Addenda: 0 Note Initiated On: 12/12/2017 8:53 AM Scope Withdrawal Time: 0 hours 14 minutes 27 seconds  Total Procedure Duration: 0 hours 23 minutes 19 seconds       [facility]

## 2017-12-12 NOTE — Anesthesia Preprocedure Evaluation (Signed)
Anesthesia Evaluation  Patient identified by MRN, date of birth, ID band Patient awake    Reviewed: Allergy & Precautions, H&P , NPO status , Patient's Chart, lab work & pertinent test results, reviewed documented beta blocker date and time   Airway Mallampati: II   Neck ROM: full    Dental  (+) Poor Dentition   Pulmonary neg pulmonary ROS,    Pulmonary exam normal        Cardiovascular hypertension, negative cardio ROS Normal cardiovascular exam Rhythm:regular Rate:Normal     Neuro/Psych negative neurological ROS  negative psych ROS   GI/Hepatic negative GI ROS, Neg liver ROS,   Endo/Other  negative endocrine ROSdiabetes  Renal/GU negative Renal ROS  negative genitourinary   Musculoskeletal   Abdominal   Peds  Hematology negative hematology ROS (+)   Anesthesia Other Findings Past Medical History: No date: Achilles rupture, right No date: Diabetes mellitus without complication ([facility]) No date: Environmental allergies No date: Glaucoma No date: History of chicken pox No date: Hypertension Past Surgical History: 1999: BREAST BIOPSY; Right     Comment:  neg BMI    Body Mass Index:  32.01 kg/m     Reproductive/Obstetrics negative OB ROS                             Anesthesia Physical Anesthesia Plan  ASA: II  Anesthesia Plan: General   Post-op Pain Management:    Induction:   PONV Risk Score and Plan:   Airway Management Planned:   Additional Equipment:   Intra-op Plan:   Post-operative Plan:   Informed Consent: I have reviewed the patients History and Physical, chart, labs and discussed the procedure including the risks, benefits and alternatives for the proposed anesthesia with the patient or authorized representative who has indicated his/her understanding and acceptance.   Dental Advisory Given  Plan Discussed with: CRNA  Anesthesia Plan Comments:          Anesthesia Quick Evaluation

## 2017-12-12 NOTE — H&P (Signed)
Primary Care Physician:  Einar Pheasant, MD Primary Gastroenterologist:  Dr. Vira Agar  Pre-Procedure History & Physical: HPI:  April Solis is a 62 y.o. female is here for an colonoscopy.  FH colon polyps.   Past Medical History:  Diagnosis Date  . Achilles rupture, right   . Diabetes mellitus without complication ([place])   . Environmental allergies   . Glaucoma   . History of chicken pox   . Hypertension     Past Surgical History:  Procedure Laterality Date  . BREAST BIOPSY Right 1999   neg    Prior to Admission medications   Medication Sig Start Date End Date Taking? Authorizing Provider  aspirin EC 81 MG tablet Take 81 mg by mouth daily.   Yes [provider]  Multiple Vitamin (MULTIVITAMIN) tablet Take 1 tablet by mouth daily.   Yes [provider]  vitamin E 200 UNIT capsule Take 200 Units by mouth daily.   Yes [provider]  amLODipine (NORVASC) 5 MG tablet TAKE 1 TABLET BY MOUTH DAILY 10/24/17   Einar Pheasant, MD  CONTOUR NEXT TEST test strip TEST TWICE DAILY 05/02/17   Einar Pheasant, MD  dorzolamide-timolol (COSOPT) 22.3-6.8 MG/ML ophthalmic solution  07/05/14   [provider]  lisinopril (PRINIVIL,ZESTRIL) 10 MG tablet TAKE 1 TABLET BY MOUTH DAILY 10/24/17   Einar Pheasant, MD  metFORMIN (GLUCOPHAGE) 500 MG tablet TAKE 1 TABLET BY MOUTH TWICE DAILY WITH A MEAL 11/24/17   Einar Pheasant, MD  timolol (BETIMOL) 0.25 % ophthalmic solution 1-2 drops 2 (two) times daily.    [provider]  triamcinolone cream (KENALOG) 0.1 % APPLY TO THE AFFECTED AREA DAILY AS NEEDED 11/23/17   Einar Pheasant, MD  triamterene-hydrochlorothiazide (DYAZIDE) 37.5-25 MG capsule TAKE 1 CAPSULE BY MOUTH EACH MORNING 10/24/17   Einar Pheasant, MD    Allergies as of 09/21/2017 - Review Complete 07/03/2017  Allergen Reaction Noted  . Influenza vaccines Other (See Comments) 04/27/2012  . Rubbing alcohol [alcohol]  04/28/2012  . Other Rash  12/16/2014    Family History  Problem Relation Age of Onset  . Heart disease Father   . Hypertension Father   . Diabetes Father   . Diabetes Sister        x2  . Colon polyps Mother   . Breast cancer Neg Hx   . Colon cancer Neg Hx     Social History   Socioeconomic History  . Marital status: Married    Spouse name: Not on file  . Number of children: 3  . Years of education: Not on file  . Highest education level: Not on file  Occupational History    Employer: [employer]  . Financial resource strain: Not on file  . Food insecurity:    Worry: Not on file    Inability: Not on file  . Transportation needs:    Medical: Not on file    Non-medical: Not on file  Tobacco Use  . Smoking status: Never Smoker  . Smokeless tobacco: Never Used  Substance and Sexual Activity  . Alcohol use: No    Alcohol/week: 0.0 standard drinks  . Drug use: No  . Sexual activity: Not on file  Lifestyle  . Physical activity:    Days per week: Not on file    Minutes per session: Not on file  . Stress: Not on file  Relationships  . Social connections:    Talks on phone: Not on file  Gets together: Not on file    Attends religious service: Not on file    Active member of club or organization: Not on file    Attends meetings of clubs or organizations: Not on file    Relationship status: Not on file  . Intimate partner violence:    Fear of current or ex partner: Not on file    Emotionally abused: Not on file    Physically abused: Not on file    Forced sexual activity: Not on file  Other Topics Concern  . Not on file  Social History Narrative  . Not on file    Review of Systems: See HPI, otherwise negative ROS  Physical Exam: BP 129/89   Pulse 64   Temp (!) 96.6 F (35.9 C)   Resp 17   Ht 5\' 2"  (1.575 m)   Wt 79.4 kg   LMP 02/26/2008   SpO2 98%   BMI 32.01 kg/m  General:   Alert,  pleasant and cooperative in NAD Head:  Normocephalic and atraumatic. Neck:  Supple;  no masses or thyromegaly. Lungs:  Clear throughout to auscultation.    Heart:  Regular rate and rhythm. Abdomen:  Soft, nontender and nondistended. Normal bowel sounds, without guarding, and without rebound.   Neurologic:  Alert and  oriented x4;  grossly normal neurologically.  Impression/Plan: [name] is here for an colonoscopy to be performed for Family history of colon polyps.  Risks, benefits, limitations, and alternatives regarding  colonoscopy have been reviewed with the patient.  Questions have been answered.  All parties agreeable.   Gaylyn Cheers, MD  12/12/2017, 8:50 AM

## 2017-12-12 NOTE — Transfer of Care (Signed)
Immediate Anesthesia Transfer of Care Note  Patient: April Solis  Procedure(s) Performed: COLONOSCOPY WITH PROPOFOL (N/A )  Patient Location: PACU  Anesthesia Type:General  Level of Consciousness: sedated  Airway & Oxygen Therapy: Patient Spontanous Breathing and Patient connected to nasal cannula oxygen  Post-op Assessment: Report given to RN and Post -op Vital signs reviewed and stable  Post vital signs: Reviewed and stable  Last Vitals:  Vitals Value Taken Time  BP    Temp    Pulse    Resp    SpO2      Last Pain:  Vitals:   12/12/17 0731  PainSc: 0-No pain         Complications: No apparent anesthesia complications

## 2017-12-12 NOTE — Anesthesia Postprocedure Evaluation (Signed)
Anesthesia Post Note  Patient: April Solis  Procedure(s) Performed: COLONOSCOPY WITH PROPOFOL (N/A )  Patient location during evaluation: PACU Anesthesia Type: General Level of consciousness: awake and alert Pain management: pain level controlled Vital Signs Assessment: post-procedure vital signs reviewed and stable Respiratory status: spontaneous breathing, nonlabored ventilation, respiratory function stable and patient connected to nasal cannula oxygen Cardiovascular status: blood pressure returned to baseline and stable Postop Assessment: no apparent nausea or vomiting Anesthetic complications: no     Last Vitals:  Vitals:   12/12/17 0936 12/12/17 0946  BP: 119/71 122/73  Pulse: 64   Resp: 19   Temp:    SpO2: 96% 95%    Last Pain:  Vitals:   12/12/17 0946  TempSrc:   PainSc: 0-No pain                 Molli Barrows

## 2017-12-13 ENCOUNTER — Encounter: Payer: Self-pay | Admitting: Unknown Physician Specialty

## 2017-12-14 LAB — SURGICAL PATHOLOGY

## 2017-12-15 ENCOUNTER — Ambulatory Visit
Admission: RE | Admit: 2017-12-15 | Discharge: 2017-12-15 | Disposition: A | Discharge status: Home / Self Care | Payer: BLUE CROSS/BLUE SHIELD | Source: Ambulatory Visit | Attending: Internal Medicine | Admitting: Internal Medicine

## 2017-12-15 DIAGNOSIS — Z1231 Encounter for screening mammogram for malignant neoplasm of breast: Secondary | ICD-10-CM | POA: Insufficient documentation

## 2017-12-22 ENCOUNTER — Other Ambulatory Visit (INDEPENDENT_AMBULATORY_CARE_PROVIDER_SITE_OTHER): Payer: BLUE CROSS/BLUE SHIELD

## 2017-12-22 DIAGNOSIS — R7989 Other specified abnormal findings of blood chemistry: Secondary | ICD-10-CM

## 2017-12-22 DIAGNOSIS — R945 Abnormal results of liver function studies: Secondary | ICD-10-CM

## 2017-12-23 ENCOUNTER — Encounter: Payer: Self-pay | Admitting: Internal Medicine

## 2017-12-23 LAB — HEPATIC FUNCTION PANEL
ALT: 34 U/L (ref 0–35)
AST: 28 U/L (ref 0–37)
Albumin: 4.3 g/dL (ref 3.5–5.2)
Alkaline Phosphatase: 57 U/L (ref 39–117)
BILIRUBIN DIRECT: 0.1 mg/dL (ref 0.0–0.3)
BILIRUBIN TOTAL: 0.4 mg/dL (ref 0.2–1.2)
TOTAL PROTEIN: 7.1 g/dL (ref 6.0–8.3)

## 2018-01-16 LAB — HM DIABETES EYE EXAM

## 2018-01-23 ENCOUNTER — Other Ambulatory Visit: Payer: Self-pay | Admitting: Internal Medicine

## 2018-01-26 ENCOUNTER — Other Ambulatory Visit: Payer: Self-pay | Admitting: Internal Medicine

## 2018-02-10 ENCOUNTER — Ambulatory Visit: Payer: BLUE CROSS/BLUE SHIELD | Admitting: Internal Medicine

## 2018-02-10 ENCOUNTER — Encounter: Payer: Self-pay | Admitting: Internal Medicine

## 2018-02-10 DIAGNOSIS — I10 Essential (primary) hypertension: Secondary | ICD-10-CM | POA: Diagnosis not present

## 2018-02-10 DIAGNOSIS — F439 Reaction to severe stress, unspecified: Secondary | ICD-10-CM | POA: Diagnosis not present

## 2018-02-10 DIAGNOSIS — E119 Type 2 diabetes mellitus without complications: Secondary | ICD-10-CM

## 2018-02-10 DIAGNOSIS — E669 Obesity, unspecified: Secondary | ICD-10-CM | POA: Diagnosis not present

## 2018-02-10 MED ORDER — METFORMIN HCL 1000 MG PO TABS: 1000.0000 mg | ORAL_TABLET | Freq: Two times a day (BID) | ORAL | 3 refills | Status: DC

## 2018-02-10 NOTE — Progress Notes (Signed)
[name], female   DOB: 03-05-1956, 62 y.o.   MRN: [id number]   Subjective:    [name], female    DOB: 12-17-1955, 62 y.o.   MRN: [id number]  HPI  Patient here for a scheduled follow up.  She reports she is doing well.  Feels good.  Trying to stay active.  No chest pain.  Mother passed away in December 21, 2017.  Handling things well.  Breathing stable.  Trying to watch her diet.  Reviewed outside sugars.  AM sugars averaging 120-140.  No abdominal pain.  No acid reflux.  Bowels moving.  Had colonoscopy 12/2017.     Past Medical History:  Diagnosis Date  . Achilles rupture, right   . Diabetes mellitus without complication ([place])   . Environmental allergies   . Glaucoma   . History of chicken pox   . Hypertension    Past Surgical History:  Procedure Laterality Date  . BREAST BIOPSY Right 1999   neg  . COLONOSCOPY WITH PROPOFOL N/A 12/12/2017   Procedure: COLONOSCOPY WITH PROPOFOL;  Surgeon: Manya Silvas, MD;  Location: [facility] ENDOSCOPY;  Service: Endoscopy;  Laterality: N/A;   Family History  Problem Relation Age of Onset  . Heart disease Father   . Hypertension Father   . Diabetes Father   . Diabetes Sister        x2  . Colon polyps Mother   . Breast cancer Neg Hx   . Colon cancer Neg Hx    Social History   Socioeconomic History  . Marital status: Married    Spouse name: Not on file  . Number of children: 3  . Years of education: Not on file  . Highest education level: Not on file  Occupational History    Employer: [employer]  . Financial resource strain: Not on file  . Food insecurity:    Worry: Not on file    Inability: Not on file  . Transportation needs:    Medical: Not on file    Non-medical: Not on file  Tobacco Use  . Smoking status: Never Smoker  . Smokeless tobacco: Never Used  Substance and Sexual Activity  . Alcohol use: No    Alcohol/week: 0.0 standard drinks  . Drug use: No  . Sexual activity:  Not on file  Lifestyle  . Physical activity:    Days per week: Not on file    Minutes per session: Not on file  . Stress: Not on file  Relationships  . Social connections:    Talks on phone: Not on file    Gets together: Not on file    Attends religious service: Not on file    Active member of club or organization: Not on file    Attends meetings of clubs or organizations: Not on file    Relationship status: Not on file  Other Topics Concern  . Not on file  Social History Narrative  . Not on file    Outpatient Encounter Medications as of 02/10/2018  Medication Sig  . amLODipine (NORVASC) 5 MG tablet TAKE 1 TABLET BY MOUTH DAILY  . aspirin EC 81 MG tablet Take 81 mg by mouth daily.  . CONTOUR NEXT TEST test strip TEST TWICE DAILY  . dorzolamide-timolol (COSOPT) 22.3-6.8 MG/ML ophthalmic solution   . lisinopril (PRINIVIL,ZESTRIL) 10 MG tablet TAKE 1 TABLET BY MOUTH DAILY  . metFORMIN (GLUCOPHAGE) 1000 MG tablet Take 1 tablet (1,000 mg total) by mouth  2 (two) times daily with a meal.  . Multiple Vitamin (MULTIVITAMIN) tablet Take 1 tablet by mouth daily.  . timolol (BETIMOL) 0.25 % ophthalmic solution 1-2 drops 2 (two) times daily.  Marland Kitchen triamcinolone cream (KENALOG) 0.1 % APPLY TO THE AFFECTED AREA DAILY AS NEEDED  . triamterene-hydrochlorothiazide (DYAZIDE) 37.5-25 MG capsule TAKE 1 CAPSULE BY MOUTH EACH MORNING  . vitamin E 200 UNIT capsule Take 200 Units by mouth daily.  . [DISCONTINUED] metFORMIN (GLUCOPHAGE) 500 MG tablet TAKE ONE TABLET BY MOUTH TWICE DAILY WITH A MEAL   No facility-administered encounter medications on file as of 02/10/2018.     Review of Systems  Constitutional: Negative for appetite change and unexpected weight change.  HENT: Negative for congestion and sore throat.   Respiratory: Negative for cough, chest tightness and shortness of breath.   Cardiovascular: Negative for chest pain, palpitations and leg swelling.  Gastrointestinal: Negative for abdominal  pain, diarrhea, nausea and vomiting.  Genitourinary: Negative for difficulty urinating and dysuria.  Musculoskeletal: Negative for joint swelling and myalgias.  Skin: Negative for color change and rash.  Neurological: Negative for dizziness, light-headedness and headaches.  Psychiatric/Behavioral: Negative for agitation and dysphoric mood.       Objective:    Physical Exam  Constitutional: She appears well-developed and well-nourished. No distress.  HENT:  Nose: Nose normal.  Mouth/Throat: Oropharynx is clear and moist.  Neck: Neck supple. No thyromegaly present.  Cardiovascular: Normal rate and regular rhythm.  Pulmonary/Chest: Breath sounds normal. No respiratory distress. She has no wheezes.  Abdominal: Soft. Bowel sounds are normal. There is no tenderness.  Musculoskeletal: She exhibits no edema or tenderness.  Lymphadenopathy:    She has no cervical adenopathy.  Skin: No rash noted. No erythema.  Psychiatric: She has a normal mood and affect. Her behavior is normal.    BP 120/76 (BP Location: Left Arm, Patient Position: Sitting, Cuff Size: Normal)   Pulse (!) 57   Temp 97.7 F (36.5 C) (Oral)   Resp 18   Wt 183 lb 9.6 oz (83.3 kg)   LMP 02/26/2008   SpO2 98%   BMI 33.58 kg/m  Wt Readings from Last 3 Encounters:  02/10/18 183 lb 9.6 oz (83.3 kg)  12/12/17 175 lb (79.4 kg)  10/28/17 186 lb 12.8 oz (84.7 kg)     Lab Results  Component Value Date   WBC 6.0 03/23/2017   HGB 13.9 03/23/2017   HCT 42.1 03/23/2017   PLT 223.0 03/23/2017   GLUCOSE 173 (H) 12/07/2017   CHOL 163 12/07/2017   TRIG 223.0 (H) 12/07/2017   HDL 37.70 (L) 12/07/2017   LDLDIRECT 100.0 12/07/2017   LDLCALC 93 07/26/2017   ALT 34 12/22/2017   AST 28 12/22/2017   NA 137 12/07/2017   K 4.1 12/07/2017   CL 102 12/07/2017   CREATININE 0.92 12/07/2017   BUN 19 12/07/2017   CO2 28 12/07/2017   TSH 0.55 03/23/2017   HGBA1C 7.0 (H) 12/07/2017   MICROALBUR 0.7 03/23/2017    Mm 3d Screen  Breast Bilateral  Result Date: 12/15/2017 CLINICAL DATA:  Screening. EXAM: DIGITAL SCREENING BILATERAL MAMMOGRAM WITH TOMO AND CAD COMPARISON:  Previous exam(s). ACR Breast Density Category c: The breast tissue is heterogeneously dense, which may obscure small masses. FINDINGS: There are no findings suspicious for malignancy. Images were processed with CAD. IMPRESSION: No mammographic evidence of malignancy. A result letter of this screening mammogram will be mailed directly to the patient. RECOMMENDATION: Screening mammogram in one year. (Code:SM-B-01Y)  BI-RADS CATEGORY  1: Negative. Electronically Signed   By: Kristopher Oppenheim M.D.   On: 12/15/2017 15:53       Assessment & Plan:   Problem List Items Addressed This Visit    Diabetes mellitus ([place])    Sugars reviewed.  Appear to be doing well.  On metformin.  Taking 103m bid.  Low carb diet and exercise. Follow met b and a1c.        Relevant Medications   metFORMIN (GLUCOPHAGE) 1000 MG tablet   Other Relevant Orders   Hemoglobin A1c   Hepatic function panel   Lipid panel   Basic metabolic panel   Microalbumin / creatinine urine ratio   Hypertension    Blood pressure under good control.  Continue same medication regimen.  Follow pressures.  Follow metabolic panel.        Relevant Orders   CBC with Differential/Platelet   TSH   Obesity (BMI 30-39.9)    Discussed diet and exercise.  Follow.        Relevant Medications   metFORMIN (GLUCOPHAGE) 1000 MG tablet   Stress    Increased stress as outlined.  Overall she feels she is doing well.  Does not feel needs any further intervention.  Follow.            CEinar Pheasant MD

## 2018-02-12 ENCOUNTER — Encounter: Payer: Self-pay | Admitting: Internal Medicine

## 2018-02-12 NOTE — Assessment & Plan Note (Signed)
Increased stress as outlined.  Overall she feels she is doing well.  Does not feel needs any further intervention.  Follow.

## 2018-02-12 NOTE — Assessment & Plan Note (Signed)
Sugars reviewed.  Appear to be doing well.  On metformin.  Taking 10102m bid.  Low carb diet and exercise. Follow met b and a1c.

## 2018-02-12 NOTE — Assessment & Plan Note (Signed)
Discussed diet and exercise.  Follow.  

## 2018-02-12 NOTE — Assessment & Plan Note (Signed)
Blood pressure under good control.  Continue same medication regimen.  Follow pressures.  Follow metabolic panel.   

## 2018-04-27 ENCOUNTER — Other Ambulatory Visit: Payer: Self-pay | Admitting: Internal Medicine

## 2018-05-26 ENCOUNTER — Other Ambulatory Visit: Payer: Self-pay | Admitting: Internal Medicine

## 2018-05-29 ENCOUNTER — Other Ambulatory Visit (INDEPENDENT_AMBULATORY_CARE_PROVIDER_SITE_OTHER): Payer: PRIVATE HEALTH INSURANCE

## 2018-05-29 DIAGNOSIS — E119 Type 2 diabetes mellitus without complications: Secondary | ICD-10-CM

## 2018-05-29 DIAGNOSIS — I10 Essential (primary) hypertension: Secondary | ICD-10-CM | POA: Diagnosis not present

## 2018-05-29 LAB — TSH: TSH: 0.59 u[IU]/mL (ref 0.35–4.50)

## 2018-05-29 LAB — CBC WITH DIFFERENTIAL/PLATELET
Basophils Absolute: 0.1 10*3/uL (ref 0.0–0.1)
Basophils Relative: 0.8 % (ref 0.0–3.0)
Eosinophils Absolute: 0.2 10*3/uL (ref 0.0–0.7)
Eosinophils Relative: 3.3 % (ref 0.0–5.0)
HEMATOCRIT: 41.2 % (ref 36.0–46.0)
HEMOGLOBIN: 14 g/dL (ref 12.0–15.0)
Lymphocytes Relative: 38.9 % (ref 12.0–46.0)
Lymphs Abs: 2.6 10*3/uL (ref 0.7–4.0)
MCHC: 34.1 g/dL (ref 30.0–36.0)
MCV: 91.7 fl (ref 78.0–100.0)
Monocytes Absolute: 0.8 10*3/uL (ref 0.1–1.0)
Monocytes Relative: 11.6 % (ref 3.0–12.0)
Neutro Abs: 3 10*3/uL (ref 1.4–7.7)
Neutrophils Relative %: 45.4 % (ref 43.0–77.0)
Platelets: 229 10*3/uL (ref 150.0–400.0)
RBC: 4.5 Mil/uL (ref 3.87–5.11)
RDW: 13.5 % (ref 11.5–15.5)
WBC: 6.6 10*3/uL (ref 4.0–10.5)

## 2018-05-29 LAB — BASIC METABOLIC PANEL
BUN: 18 mg/dL (ref 6–23)
CALCIUM: 8.9 mg/dL (ref 8.4–10.5)
CO2: 26 mEq/L (ref 19–32)
Chloride: 106 mEq/L (ref 96–112)
Creatinine, Ser: 0.79 mg/dL (ref 0.40–1.20)
GFR: 73.6 mL/min (ref >60.00)
Glucose, Bld: 126 mg/dL — ABNORMAL HIGH (ref 70–99)
Potassium: 4.2 mEq/L (ref 3.5–5.1)
Sodium: 141 mEq/L (ref 135–145)

## 2018-05-29 LAB — LIPID PANEL
Cholesterol: 164 mg/dL (ref 0–200)
HDL: 40.9 mg/dL (ref >39.00)
LDL CALC: 86 mg/dL (ref 0–99)
NonHDL: 123.13
Total CHOL/HDL Ratio: 4
Triglycerides: 187 mg/dL — ABNORMAL HIGH (ref 0.0–149.0)
VLDL: 37.4 mg/dL (ref 0.0–40.0)

## 2018-05-29 LAB — HEPATIC FUNCTION PANEL
ALT: 25 U/L (ref 0–35)
AST: 21 U/L (ref 0–37)
Albumin: 4.2 g/dL (ref 3.5–5.2)
Alkaline Phosphatase: 63 U/L (ref 39–117)
Bilirubin, Direct: 0.1 mg/dL (ref 0.0–0.3)
Total Bilirubin: 0.4 mg/dL (ref 0.2–1.2)
Total Protein: 7 g/dL (ref 6.0–8.3)

## 2018-05-29 LAB — HEMOGLOBIN A1C: Hgb A1c MFr Bld: 6.3 % (ref 4.6–6.5)

## 2018-05-29 LAB — MICROALBUMIN / CREATININE URINE RATIO
Creatinine,U: 133.6 mg/dL
Microalb Creat Ratio: 0.7 mg/g (ref 0.0–30.0)
Microalb, Ur: 1 mg/dL (ref 0.0–1.9)

## 2018-05-30 LAB — HM DIABETES FOOT EXAM

## 2018-05-31 ENCOUNTER — Ambulatory Visit (INDEPENDENT_AMBULATORY_CARE_PROVIDER_SITE_OTHER): Payer: PRIVATE HEALTH INSURANCE | Admitting: Internal Medicine

## 2018-05-31 ENCOUNTER — Encounter: Payer: Self-pay | Admitting: Internal Medicine

## 2018-05-31 VITALS — BP 110/72 | HR 78 | Temp 98.1°F | Resp 16 | Ht 62.0 in | Wt 181.4 lb

## 2018-05-31 DIAGNOSIS — I10 Essential (primary) hypertension: Secondary | ICD-10-CM | POA: Diagnosis not present

## 2018-05-31 DIAGNOSIS — E669 Obesity, unspecified: Secondary | ICD-10-CM

## 2018-05-31 DIAGNOSIS — Z0000 Encounter for general adult medical examination without abnormal findings: Secondary | ICD-10-CM

## 2018-05-31 DIAGNOSIS — E119 Type 2 diabetes mellitus without complications: Secondary | ICD-10-CM

## 2018-05-31 NOTE — Assessment & Plan Note (Signed)
Physical today 05/31/18.  Mammogram 12/15/17 - Birads I.  Colonoscopy 12/2017.

## 2018-05-31 NOTE — Progress Notes (Signed)
Patient ID: Tyanna Hach, female   DOB: November 04, 1955, 63 y.o.   MRN: [id number]   Subjective:    Patient ID: [name], female    DOB: 1955-04-29, 63 y.o.   MRN: [id number]  HPI  Patient here for her physical exam.  She reports she is doing relatively well.  Has tried to adjust her diet.  Watching her carb intake.  Discussed labs.  a1c improved - 6.3.  Trying to stay active.  No chest pain.  No sob.  No acid reflux.  No abdominal pain.  Bowels moving.  Overall feels good.  Has dentist appt 07/2018.  Has eye exam 07/2018.     Past Medical History:  Diagnosis Date  . Achilles rupture, right   . Diabetes mellitus without complication ([place])   . Environmental allergies   . Glaucoma   . History of chicken pox   . Hypertension    Past Surgical History:  Procedure Laterality Date  . BREAST BIOPSY Right 1999   neg  . COLONOSCOPY WITH PROPOFOL N/A 12/12/2017   Procedure: COLONOSCOPY WITH PROPOFOL;  Surgeon: Manya Silvas, MD;  Location: [facility] ENDOSCOPY;  Service: Endoscopy;  Laterality: N/A;   Family History  Problem Relation Age of Onset  . Heart disease Father   . Hypertension Father   . Diabetes Father   . Diabetes Sister        x2  . Colon polyps Mother   . Breast cancer Neg Hx   . Colon cancer Neg Hx    Social History   Socioeconomic History  . Marital status: Married    Spouse name: Not on file  . Number of children: 3  . Years of education: Not on file  . Highest education level: Not on file  Occupational History    Employer: [employer]  . Financial resource strain: Not on file  . Food insecurity:    Worry: Not on file    Inability: Not on file  . Transportation needs:    Medical: Not on file    Non-medical: Not on file  Tobacco Use  . Smoking status: Never Smoker  . Smokeless tobacco: Never Used  Substance and Sexual Activity  . Alcohol use: No    Alcohol/week: 0.0 standard drinks  . Drug use: No  . Sexual activity: Not on file    Lifestyle  . Physical activity:    Days per week: Not on file    Minutes per session: Not on file  . Stress: Not on file  Relationships  . Social connections:    Talks on phone: Not on file    Gets together: Not on file    Attends religious service: Not on file    Active member of club or organization: Not on file    Attends meetings of clubs or organizations: Not on file    Relationship status: Not on file  Other Topics Concern  . Not on file  Social History Narrative  . Not on file    Outpatient Encounter Medications as of 05/31/2018  Medication Sig  . amLODipine (NORVASC) 5 MG tablet TAKE ONE TABLET EVERY DAY  . aspirin EC 81 MG tablet Take 81 mg by mouth daily.  . CONTOUR NEXT TEST test strip TEST TWICE DAILY  . dorzolamide-timolol (COSOPT) 22.3-6.8 MG/ML ophthalmic solution   . lisinopril (PRINIVIL,ZESTRIL) 10 MG tablet TAKE ONE TABLET EVERY DAY  . metFORMIN (GLUCOPHAGE) 1000 MG tablet Take 1 tablet (1,000 mg total) by  mouth 2 (two) times daily with a meal.  . Multiple Vitamin (MULTIVITAMIN) tablet Take 1 tablet by mouth daily.  . timolol (BETIMOL) 0.25 % ophthalmic solution 1-2 drops 2 (two) times daily.  Marland Kitchen triamcinolone cream (KENALOG) 0.1 % APPLY TO THE AFFECTED AREA DAILY AS NEEDED  . triamterene-hydrochlorothiazide (DYAZIDE) 37.5-25 MG capsule TAKE 1 CAPSULE EVERY MORNING  . vitamin E 200 UNIT capsule Take 200 Units by mouth daily.   No facility-administered encounter medications on file as of 05/31/2018.     Review of Systems  Constitutional: Negative for appetite change and unexpected weight change.  HENT: Negative for congestion and sinus pressure.   Eyes: Negative for pain and visual disturbance.  Respiratory: Negative for cough, chest tightness and shortness of breath.   Cardiovascular: Negative for chest pain, palpitations and leg swelling.  Gastrointestinal: Negative for abdominal pain, diarrhea, nausea and vomiting.  Genitourinary: Negative for difficulty  urinating and dysuria.  Musculoskeletal: Negative for joint swelling and myalgias.  Skin: Negative for color change and rash.  Neurological: Negative for dizziness, light-headedness and headaches.  Hematological: Negative for adenopathy. Does not bruise/bleed easily.  Psychiatric/Behavioral: Negative for agitation and dysphoric mood.       Objective:    Physical Exam Constitutional:      General: She is not in acute distress.    Appearance: Normal appearance. She is well-developed.  HENT:     Nose: Nose normal. No congestion.     Mouth/Throat:     Pharynx: No oropharyngeal exudate or posterior oropharyngeal erythema.  Eyes:     General: No scleral icterus.       Right eye: No discharge.        Left eye: No discharge.  Neck:     Musculoskeletal: Neck supple. No muscular tenderness.     Thyroid: No thyromegaly.  Cardiovascular:     Rate and Rhythm: Normal rate and regular rhythm.  Pulmonary:     Effort: No tachypnea, accessory muscle usage or respiratory distress.     Breath sounds: Normal breath sounds. No decreased breath sounds or wheezing.  Chest:     Breasts:        Right: No inverted nipple, mass, nipple discharge or tenderness (no axillary adenopathy).        Left: No inverted nipple, mass, nipple discharge or tenderness (no axilarry adenopathy).  Abdominal:     General: Bowel sounds are normal.     Palpations: Abdomen is soft.     Tenderness: There is no abdominal tenderness.  Musculoskeletal:        General: No swelling or tenderness.     Comments: Feet:  No lesions.  DP pulses palpable and equal bilaterally.  Intact to light touch and pin prick.    Lymphadenopathy:     Cervical: No cervical adenopathy.  Skin:    Findings: No erythema or rash.  Neurological:     Mental Status: She is alert and oriented to person, place, and time.  Psychiatric:        Mood and Affect: Mood normal.        Behavior: Behavior normal.     BP 110/72   Pulse 78   Temp 98.1 F  (36.7 C) (Oral)   Resp 16   Ht _0  (1.575 m)   Wt 181 lb 6.4 oz (82.3 kg)   LMP 02/26/2008   SpO2 97%   BMI 33.18 kg/m  Wt Readings from Last 3 Encounters:  05/31/18 181 lb 6.4 oz (82.3 kg)  02/10/18 183 lb 9.6 oz (83.3 kg)  12/12/17 175 lb (79.4 kg)     Lab Results  Component Value Date   WBC 6.6 05/29/2018   HGB 14.0 05/29/2018   HCT 41.2 05/29/2018   PLT 229.0 05/29/2018   GLUCOSE 126 (H) 05/29/2018   CHOL 164 05/29/2018   TRIG 187.0 (H) 05/29/2018   HDL 40.90 05/29/2018   LDLDIRECT 100.0 12/07/2017   LDLCALC 86 05/29/2018   ALT 25 05/29/2018   AST 21 05/29/2018   NA 141 05/29/2018   K 4.2 05/29/2018   CL 106 05/29/2018   CREATININE 0.79 05/29/2018   BUN 18 05/29/2018   CO2 26 05/29/2018   TSH 0.59 05/29/2018   HGBA1C 6.3 05/29/2018   MICROALBUR 1.0 05/29/2018    Mm 3d Screen Breast Bilateral  Result Date: 12/15/2017 CLINICAL DATA:  Screening. EXAM: DIGITAL SCREENING BILATERAL MAMMOGRAM WITH TOMO AND CAD COMPARISON:  Previous exam(s). ACR Breast Density Category c: The breast tissue is heterogeneously dense, which may obscure small masses. FINDINGS: There are no findings suspicious for malignancy. Images were processed with CAD. IMPRESSION: No mammographic evidence of malignancy. A result letter of this screening mammogram will be mailed directly to the patient. RECOMMENDATION: Screening mammogram in one year. (Code:SM-B-01Y) BI-RADS CATEGORY  1: Negative. Electronically Signed   By: Kristopher Oppenheim M.D.   On: 12/15/2017 15:53       Assessment & Plan:   Problem List Items Addressed This Visit    Diabetes mellitus ([place])    Has adjusted her diet.  a1c improved - 6.3.  Continue low carb diet and exercise.  Follow met b and a1c.  Planning for eye exam in 07/2018.       Relevant Orders   Hemoglobin F5D   Basic metabolic panel   Health care maintenance    Physical today 05/31/18.  Mammogram 12/15/17 - Birads I.  Colonoscopy 12/2017.        Hypertension    Blood  pressure under good control.  Continue same medication regimen.  Follow pressures.  Follow metabolic panel.        Relevant Orders   Lipid panel   Hepatic function panel   Obesity (BMI 30-39.9)    Continue diet and exercise.  Follow.        Other Visit Diagnoses    Routine general medical examination at a health care facility    -  Primary       Einar Pheasant, MD

## 2018-06-03 ENCOUNTER — Encounter: Payer: Self-pay | Admitting: Internal Medicine

## 2018-06-03 NOTE — Assessment & Plan Note (Signed)
Continue diet and exercise.  Follow.  

## 2018-06-03 NOTE — Assessment & Plan Note (Signed)
Has adjusted her diet.  a1c improved - 6.3.  Continue low carb diet and exercise.  Follow met b and a1c.  Planning for eye exam in 07/2018.

## 2018-06-03 NOTE — Assessment & Plan Note (Signed)
Blood pressure under good control.  Continue same medication regimen.  Follow pressures.  Follow metabolic panel.   

## 2018-07-25 ENCOUNTER — Other Ambulatory Visit: Payer: Self-pay | Admitting: Internal Medicine

## 2018-09-23 ENCOUNTER — Other Ambulatory Visit: Payer: Self-pay | Admitting: Internal Medicine

## 2018-10-02 ENCOUNTER — Other Ambulatory Visit (INDEPENDENT_AMBULATORY_CARE_PROVIDER_SITE_OTHER): Payer: PRIVATE HEALTH INSURANCE

## 2018-10-02 ENCOUNTER — Other Ambulatory Visit: Payer: Self-pay

## 2018-10-02 DIAGNOSIS — I10 Essential (primary) hypertension: Secondary | ICD-10-CM | POA: Diagnosis not present

## 2018-10-02 DIAGNOSIS — E119 Type 2 diabetes mellitus without complications: Secondary | ICD-10-CM | POA: Diagnosis not present

## 2018-10-02 LAB — BASIC METABOLIC PANEL
BUN: 15 mg/dL (ref 6–23)
CO2: 24 mEq/L (ref 19–32)
Calcium: 9.1 mg/dL (ref 8.4–10.5)
Chloride: 103 mEq/L (ref 96–112)
Creatinine, Ser: 0.81 mg/dL (ref 0.40–1.20)
GFR: 71.43 mL/min (ref >60.00)
Glucose, Bld: 122 mg/dL — ABNORMAL HIGH (ref 70–99)
Potassium: 4.2 mEq/L (ref 3.5–5.1)
Sodium: 138 mEq/L (ref 135–145)

## 2018-10-02 LAB — LIPID PANEL
Cholesterol: 169 mg/dL (ref 0–200)
HDL: 44 mg/dL (ref >39.00)
LDL Cholesterol: 89 mg/dL (ref 0–99)
NonHDL: 124.77
Total CHOL/HDL Ratio: 4
Triglycerides: 177 mg/dL — ABNORMAL HIGH (ref 0.0–149.0)
VLDL: 35.4 mg/dL (ref 0.0–40.0)

## 2018-10-02 LAB — HEPATIC FUNCTION PANEL
ALT: 25 U/L (ref 0–35)
AST: 19 U/L (ref 0–37)
Albumin: 4.2 g/dL (ref 3.5–5.2)
Alkaline Phosphatase: 62 U/L (ref 39–117)
Bilirubin, Direct: 0.1 mg/dL (ref 0.0–0.3)
Total Bilirubin: 0.4 mg/dL (ref 0.2–1.2)
Total Protein: 6.7 g/dL (ref 6.0–8.3)

## 2018-10-02 LAB — HEMOGLOBIN A1C: Hgb A1c MFr Bld: 6.1 % (ref 4.6–6.5)

## 2018-10-05 ENCOUNTER — Ambulatory Visit (INDEPENDENT_AMBULATORY_CARE_PROVIDER_SITE_OTHER): Payer: PRIVATE HEALTH INSURANCE | Admitting: Internal Medicine

## 2018-10-05 ENCOUNTER — Encounter: Payer: Self-pay | Admitting: Internal Medicine

## 2018-10-05 ENCOUNTER — Other Ambulatory Visit: Payer: Self-pay

## 2018-10-05 DIAGNOSIS — Z1231 Encounter for screening mammogram for malignant neoplasm of breast: Secondary | ICD-10-CM

## 2018-10-05 DIAGNOSIS — I10 Essential (primary) hypertension: Secondary | ICD-10-CM

## 2018-10-05 DIAGNOSIS — F439 Reaction to severe stress, unspecified: Secondary | ICD-10-CM

## 2018-10-05 DIAGNOSIS — E119 Type 2 diabetes mellitus without complications: Secondary | ICD-10-CM

## 2018-10-05 MED ORDER — ROSUVASTATIN CALCIUM 10 MG PO TABS: 10.0000 mg | ORAL_TABLET | Freq: Every day | ORAL | 2 refills | Status: DC

## 2018-10-05 NOTE — Progress Notes (Addendum)
[name], female   DOB: [birth year], 63 y.o.   MRN: [id number]   Virtual Visit via telephone Note  This visit type was conducted due to national recommendations for restrictions regarding the COVID-19 pandemic (e.g. social distancing).  This format is felt to be most appropriate for this patient at this time.  All issues noted in this document were discussed and addressed.  No physical exam was performed (except for noted visual exam findings with Video Visits).   I connected with Brantley Fling by telephone and verified that I am speaking with the correct person using two identifiers. Location patient: home Location provider: work Persons participating in the telephone visit: patient, provider  I discussed the limitations, risks, security and privacy concerns of performing an evaluation and management service by telephone and the availability of in person appointments.  The patient expressed understanding and agreed to proceed.   Reason for visit:follow up appt  HPI: Increased stress with her husband's health issues.  He was admitted this week with an acute CVA.  Planning to go to rehab.  She reports she is handling things relatively well.  Has good support.  Trying to stay active.  Watching her diet.  Has cut out bread.  Drinking water.  a1c improved - 6.1.  Discussed labs.  Discussed calculated cholesterol risk.  She agrees to start crestor.  No acid reflux.  No abdominal pain.  Bowels moving. Trying to stay in due to COVID restrictions.  No fever.  No cough, congestion, sob, chest pain or chest tightness.     ROS: See pertinent positives and negatives per HPI.  Past Medical History:  Diagnosis Date  . Achilles rupture, right   . Diabetes mellitus without complication ([place])   . Environmental allergies   . Glaucoma   . History of chicken pox   . Hypertension     Past Surgical History:  Procedure Laterality Date  . BREAST BIOPSY Right 1999   neg  .  COLONOSCOPY WITH PROPOFOL N/A 12/12/2017   Procedure: COLONOSCOPY WITH PROPOFOL;  Surgeon: Manya Silvas, MD;  Location[facility] ENDOSCOPY;  Service: Endoscopy;  Laterality: N/A;    Family History  Problem Relation Age of Onset  . Heart disease Father   . Hypertension Father   . Diabetes Father   . Diabetes Sister        x2  . Colon polyps Mother   . Breast cancer Neg Hx   . Colon cancer Neg Hx     SOCIAL HX: reviewed.    Current Outpatient Medications:  .  amLODipine (NORVASC) 5 MG tablet, TAKE 1 TABLET BY MOUTH DAILY, Disp: 90 tablet, Rfl: 0 .  aspirin EC 81 MG tablet, Take 81 mg by mouth daily., Disp: , Rfl:  .  CONTOUR NEXT TEST test strip, TEST TWICE DAILY, Disp: 100 each, Rfl: 2 .  dorzolamide-timolol (COSOPT) 22.3-6.8 MG/ML ophthalmic solution, , Disp: , Rfl:  .  lisinopril (ZESTRIL) 10 MG tablet, TAKE 1 TABLET BY MOUTH DAILY, Disp: 90 tablet, Rfl: 0 .  metFORMIN (GLUCOPHAGE) 1000 MG tablet, TAKE 1 TABLET BY MOUTH TWICE DAILY WITH A MEAL, Disp: 60 tablet, Rfl: 3 .  Multiple Vitamin (MULTIVITAMIN) tablet, Take 1 tablet by mouth daily., Disp: , Rfl:  .  rosuvastatin (CRESTOR) 10 MG tablet, Take 1 tablet (10 mg total) by mouth daily., Disp: 30 tablet, Rfl: 2 .  timolol (BETIMOL) 0.25 % ophthalmic solution, 1-2 drops 2 (two) times daily., Disp: , Rfl:  .  triamcinolone cream (KENALOG) 0.1 %, APPLY TO THE AFFECTED AREA DAILY AS NEEDED, Disp: 45 g, Rfl: 1 .  triamterene-hydrochlorothiazide (DYAZIDE) 37.5-25 MG capsule, TAKE 1 CAPSULE BY MOUTH EVERY MORNING, Disp: 90 capsule, Rfl: 0 .  vitamin E 200 UNIT capsule, Take 200 Units by mouth daily., Disp: , Rfl:   EXAM:  GENERAL: alert.  Sounds to be in no acute distress.  Answering questions appropriately.    PSYCH/NEURO: pleasant and cooperative, no obvious depression or anxiety, speech and thought processing grossly intact  ASSESSMENT AND PLAN:  Discussed the following assessment and plan:  Diabetes mellitus Continue low carb  diet and exercise.  She has adjusted her diet.  Follow met b and a1c.    Hypertension Not check her pressure on regular basis.  Discussed monitoring her blood pressure.   Continue current medication regimen.  Follow pressures.  Follow metabolic panel.   Stress Increased stress as outlined.  Discussed with her today.  She does not feel needs any further intervention.  Follow.      I discussed the assessment and treatment plan with the patient. The patient was provided an opportunity to ask questions and all were answered. The patient agreed with the plan and demonstrated an understanding of the instructions.   The patient was advised to call back or seek an in-person evaluation if the symptoms worsen or if the condition fails to improve as anticipated.  I provided 22 minutes of non-face-to-face time during this encounter.   Einar Pheasant, MD

## 2018-10-08 ENCOUNTER — Encounter: Payer: Self-pay | Admitting: Internal Medicine

## 2018-10-08 NOTE — Assessment & Plan Note (Signed)
Continue low carb diet and exercise.  She has adjusted her diet.  Follow met b and a1c.   

## 2018-10-08 NOTE — Assessment & Plan Note (Signed)
Increased stress as outlined.  Discussed with her today.  She does not feel needs any further intervention.  Follow.   

## 2018-10-08 NOTE — Assessment & Plan Note (Signed)
Not check her pressure on regular basis.  Discussed monitoring her blood pressure.   Continue current medication regimen.  Follow pressures.  Follow metabolic panel.

## 2018-10-10 ENCOUNTER — Encounter: Payer: Self-pay | Admitting: Internal Medicine

## 2018-10-24 ENCOUNTER — Other Ambulatory Visit: Payer: Self-pay | Admitting: Internal Medicine

## 2018-11-15 ENCOUNTER — Telehealth: Payer: Self-pay | Admitting: Internal Medicine

## 2018-11-15 NOTE — Telephone Encounter (Signed)
Reviewed sugars.  Sugars appear to be doing ok.  Continue diet and exercise.  We will continue to follow.  Blood pressures ok.

## 2018-11-15 NOTE — Telephone Encounter (Signed)
Sugar readings in results folder for your review

## 2018-11-15 NOTE — Telephone Encounter (Signed)
Pt dropped off sugar readings. Readings are up front in Dr.Scott's color folder.

## 2018-11-16 ENCOUNTER — Other Ambulatory Visit (INDEPENDENT_AMBULATORY_CARE_PROVIDER_SITE_OTHER): Payer: PRIVATE HEALTH INSURANCE

## 2018-11-16 ENCOUNTER — Other Ambulatory Visit: Payer: Self-pay

## 2018-11-16 DIAGNOSIS — E119 Type 2 diabetes mellitus without complications: Secondary | ICD-10-CM | POA: Diagnosis not present

## 2018-11-16 NOTE — Telephone Encounter (Signed)
Patient aware of results.

## 2018-11-16 NOTE — Addendum Note (Signed)
Addended by: Leeanne Rio on: 11/16/2018 03:19 PM   Modules accepted: Orders

## 2018-11-17 ENCOUNTER — Encounter: Payer: Self-pay | Admitting: Internal Medicine

## 2018-11-17 LAB — HEPATIC FUNCTION PANEL
AG Ratio: 1.7 (calc) (ref 1.0–2.5)
ALT: 26 U/L (ref 6–29)
AST: 26 U/L (ref 10–35)
Albumin: 4.3 g/dL (ref 3.6–5.1)
Alkaline phosphatase (APISO): 57 U/L (ref 37–153)
Bilirubin, Direct: 0.1 mg/dL (ref 0.0–0.2)
Globulin: 2.6 g/dL (calc) (ref 1.9–3.7)
Indirect Bilirubin: 0.3 mg/dL (calc) (ref 0.2–1.2)
Total Bilirubin: 0.4 mg/dL (ref 0.2–1.2)
Total Protein: 6.9 g/dL (ref 6.1–8.1)

## 2018-12-18 ENCOUNTER — Other Ambulatory Visit: Payer: Self-pay

## 2018-12-18 ENCOUNTER — Ambulatory Visit
Admission: RE | Admit: 2018-12-18 | Discharge: 2018-12-18 | Disposition: A | Discharge status: Home / Self Care | Payer: PRIVATE HEALTH INSURANCE | Source: Ambulatory Visit | Attending: Internal Medicine | Admitting: Internal Medicine

## 2018-12-18 DIAGNOSIS — Z1231 Encounter for screening mammogram for malignant neoplasm of breast: Secondary | ICD-10-CM | POA: Diagnosis not present

## 2018-12-19 ENCOUNTER — Other Ambulatory Visit: Payer: Self-pay | Admitting: Internal Medicine

## 2018-12-20 ENCOUNTER — Other Ambulatory Visit: Payer: Self-pay

## 2018-12-20 MED ORDER — GE100 BLOOD GLUCOSE TEST VI STRP: ORAL_STRIP | 2 refills | Status: DC

## 2018-12-26 ENCOUNTER — Other Ambulatory Visit: Payer: Self-pay | Admitting: Internal Medicine

## 2018-12-28 ENCOUNTER — Other Ambulatory Visit: Payer: Self-pay

## 2018-12-28 MED ORDER — GE100 BLOOD GLUCOSE TEST VI STRP: ORAL_STRIP | 2 refills | Status: DC

## 2019-01-09 ENCOUNTER — Encounter: Payer: Self-pay | Admitting: Internal Medicine

## 2019-01-09 ENCOUNTER — Ambulatory Visit (INDEPENDENT_AMBULATORY_CARE_PROVIDER_SITE_OTHER): Payer: PRIVATE HEALTH INSURANCE | Admitting: Internal Medicine

## 2019-01-09 ENCOUNTER — Other Ambulatory Visit: Payer: Self-pay

## 2019-01-09 DIAGNOSIS — E7800 Pure hypercholesterolemia, unspecified: Secondary | ICD-10-CM

## 2019-01-09 DIAGNOSIS — F439 Reaction to severe stress, unspecified: Secondary | ICD-10-CM

## 2019-01-09 DIAGNOSIS — E119 Type 2 diabetes mellitus without complications: Secondary | ICD-10-CM

## 2019-01-09 DIAGNOSIS — I10 Essential (primary) hypertension: Secondary | ICD-10-CM | POA: Diagnosis not present

## 2019-01-09 DIAGNOSIS — E1169 Type 2 diabetes mellitus with other specified complication: Secondary | ICD-10-CM | POA: Insufficient documentation

## 2019-01-09 NOTE — Assessment & Plan Note (Signed)
She had adjusted her diet.  Last a1c 6.1.  Continue low carb diet and exercise.  Follow met b and a1c.

## 2019-01-09 NOTE — Assessment & Plan Note (Signed)
On crestor.  Tolerating.  Low cholesterol diet and exercise.  Follow lipid panel and liver function tests.   

## 2019-01-09 NOTE — Progress Notes (Signed)
[name], female   DOB: 11/23/1955, 63 y.o.   MRN: [id number]   Virtual Visit via video Note  This visit type was conducted due to national recommendations for restrictions regarding the COVID-19 pandemic (e.g. social distancing).  This format is felt to be most appropriate for this patient at this time.  All issues noted in this document were discussed and addressed.  No physical exam was performed (except for noted visual exam findings with Video Visits).   I connected with April Solis by a video enabled telemedicine application or telephone and verified that I am speaking with the correct person using two identifiers. Location patient: home Location provider: work Persons participating in the virtual visit: patient, provider  I discussed the limitations, risks, security and privacy concerns of performing an evaluation and management service by telephone and the availability of in person appointments.  The patient expressed understanding and agreed to proceed.   Reason for visit: scheduled follow up.   HPI: She reports she is doing relatively well.  States sugars averaging 120-150.  Trying to watch her diet.  Stays active.  No chest pain.  No sob.  No acid reflux.  No abdominal pain.  Bowels moving.  Blood pressure doing well.  Handling stress.  Does not feel needs anything more a this time.     ROS: See pertinent positives and negatives per HPI.  Past Medical History:  Diagnosis Date  . Achilles rupture, right   . Diabetes mellitus without complication ([place])   . Environmental allergies   . Glaucoma   . History of chicken pox   . Hypertension     Past Surgical History:  Procedure Laterality Date  . BREAST BIOPSY Right 1999   neg  . COLONOSCOPY WITH PROPOFOL N/A 12/12/2017   Procedure: COLONOSCOPY WITH PROPOFOL;  Surgeon: Manya Silvas, MD;  Location: [facility] ENDOSCOPY;  Service: Endoscopy;  Laterality: N/A;    Family History  Problem Relation Age  of Onset  . Heart disease Father   . Hypertension Father   . Diabetes Father   . Diabetes Sister        x2  . Colon polyps Mother   . Breast cancer Neg Hx   . Colon cancer Neg Hx     SOCIAL HX: reviewed.    Current Outpatient Medications:  .  amLODipine (NORVASC) 5 MG tablet, TAKE 1 TABLET BY MOUTH DAILY, Disp: 90 tablet, Rfl: 0 .  aspirin EC 81 MG tablet, Take 81 mg by mouth daily., Disp: , Rfl:  .  dorzolamide-timolol (COSOPT) 22.3-6.8 MG/ML ophthalmic solution, , Disp: , Rfl:  .  glucose blood (GE100 BLOOD GLUCOSE TEST) test strip, Used to test blood sugars daily., Disp: 100 each, Rfl: 2 .  lisinopril (ZESTRIL) 10 MG tablet, TAKE 1 TABLET BY MOUTH DAILY, Disp: 90 tablet, Rfl: 0 .  metFORMIN (GLUCOPHAGE) 1000 MG tablet, TAKE ONE TABLET BY MOUTH TWICE DAILY WITH A MEAL, Disp: 60 tablet, Rfl: 3 .  Multiple Vitamin (MULTIVITAMIN) tablet, Take 1 tablet by mouth daily., Disp: , Rfl:  .  rosuvastatin (CRESTOR) 10 MG tablet, TAKE ONE TABLET EVERY DAY, Disp: 90 tablet, Rfl: 4 .  timolol (BETIMOL) 0.25 % ophthalmic solution, 1-2 drops 2 (two) times daily., Disp: , Rfl:  .  triamcinolone cream (KENALOG) 0.1 %, APPLY TO THE AFFECTED AREA DAILY AS NEEDED, Disp: 45 g, Rfl: 1 .  triamterene-hydrochlorothiazide (DYAZIDE) 37.5-25 MG capsule, TAKE 1 CAPSULE BY MOUTH EVERY MORNING, Disp: 90 capsule, Rfl:  0 .  vitamin E 200 UNIT capsule, Take 200 Units by mouth daily., Disp: , Rfl:   EXAM:  VITALS per patient if applicable:  248/25, 72  GENERAL: alert, oriented, appears well and in no acute distress  HEENT: atraumatic, conjunttiva clear, no obvious abnormalities on inspection of external nose and ears  NECK: normal movements of the head and neck  LUNGS: on inspection no signs of respiratory distress, breathing rate appears normal, no obvious gross SOB, gasping or wheezing  CV: no obvious cyanosis  PSYCH/NEURO: pleasant and cooperative, no obvious depression or anxiety, speech and thought  processing grossly intact  ASSESSMENT AND PLAN:  Discussed the following assessment and plan:  Diabetes mellitus She had adjusted her diet.  Last a1c 6.1.  Continue low carb diet and exercise.  Follow met b and a1c.   Hypercholesteremia On crestor.  Tolerating.  Low cholesterol diet and exercise.  Follow lipid panel and liver function tests.  Stress Increased stress with her husband's health issues.  Overall handling things well.  Follow.  Does not feel needs anything more at this time.    Hypertension Blood pressure under good control.  Continue same medication regimen.  Follow pressures.  Follow metabolic panel.      I discussed the assessment and treatment plan with the patient. The patient was provided an opportunity to ask questions and all were answered. The patient agreed with the plan and demonstrated an understanding of the instructions.   The patient was advised to call back or seek an in-person evaluation if the symptoms worsen or if the condition fails to improve as anticipated.   Einar Pheasant, MD

## 2019-01-09 NOTE — Assessment & Plan Note (Addendum)
Increased stress with her husband's health issues.  Overall handling things well.  Follow.  Does not feel needs anything more at this time.

## 2019-01-13 ENCOUNTER — Encounter: Payer: Self-pay | Admitting: Internal Medicine

## 2019-01-13 NOTE — Assessment & Plan Note (Signed)
Blood pressure under good control.  Continue same medication regimen.  Follow pressures.  Follow metabolic panel.   

## 2019-01-24 ENCOUNTER — Other Ambulatory Visit: Payer: Self-pay | Admitting: Internal Medicine

## 2019-02-26 ENCOUNTER — Encounter: Payer: Self-pay | Admitting: Internal Medicine

## 2019-02-26 NOTE — Telephone Encounter (Signed)
LMTCB and my chart msg sent.

## 2019-02-28 ENCOUNTER — Ambulatory Visit: Payer: PRIVATE HEALTH INSURANCE | Admitting: Internal Medicine

## 2019-04-24 ENCOUNTER — Other Ambulatory Visit: Payer: Self-pay | Admitting: Internal Medicine

## 2019-04-25 LAB — HM DIABETES EYE EXAM

## 2019-05-14 ENCOUNTER — Ambulatory Visit (INDEPENDENT_AMBULATORY_CARE_PROVIDER_SITE_OTHER): Payer: 59 | Admitting: Internal Medicine

## 2019-05-14 ENCOUNTER — Other Ambulatory Visit: Payer: Self-pay

## 2019-05-14 VITALS — BP 124/70 | HR 80 | Temp 95.7°F | Resp 16 | Ht 61.0 in | Wt 178.8 lb

## 2019-05-14 DIAGNOSIS — E7800 Pure hypercholesterolemia, unspecified: Secondary | ICD-10-CM | POA: Diagnosis not present

## 2019-05-14 DIAGNOSIS — Z0000 Encounter for general adult medical examination without abnormal findings: Secondary | ICD-10-CM

## 2019-05-14 DIAGNOSIS — F439 Reaction to severe stress, unspecified: Secondary | ICD-10-CM

## 2019-05-14 DIAGNOSIS — L659 Nonscarring hair loss, unspecified: Secondary | ICD-10-CM

## 2019-05-14 DIAGNOSIS — E119 Type 2 diabetes mellitus without complications: Secondary | ICD-10-CM

## 2019-05-14 DIAGNOSIS — R21 Rash and other nonspecific skin eruption: Secondary | ICD-10-CM

## 2019-05-14 MED ORDER — NYSTATIN 100000 UNIT/GM EX CREA: 1.0000 "application " | TOPICAL_CREAM | Freq: Two times a day (BID) | CUTANEOUS | 0 refills | Status: DC

## 2019-05-14 NOTE — Progress Notes (Signed)
[name], female   DOB: 03/04/1956, 64 y.o.   MRN: [id number]   Subjective:    [name], female    DOB: [birth year], 64 y.o.   MRN: [id number]  HPI This visit occurred during the SARS-CoV-2 public health emergency.  Safety protocols were in place, including screening questions prior to the visit, additional usage of staff PPE, and extensive cleaning of exam room while observing appropriate contact time as indicated for disinfecting solutions.  Patient here for her physical exam.  She reports she is doing relatively well.  Trying to stay active.  No chest pain or sob with increased activity or exertion.  No acid reflux or abdominal pain reported.  Bowels stable.  Rash under left breast.  Appears to be c/w yeast infection.  Does report hair is thinning.  Handling stress.     Past Medical History:  Diagnosis Date  . Achilles rupture, right   . Diabetes mellitus without complication ([place])   . Environmental allergies   . Glaucoma   . History of chicken pox   . Hypertension    Past Surgical History:  Procedure Laterality Date  . BREAST BIOPSY Right 1999   neg  . COLONOSCOPY WITH PROPOFOL N/A 12/12/2017   Procedure: COLONOSCOPY WITH PROPOFOL;  Surgeon: Manya Silvas, MD;  Location: [facility] ENDOSCOPY;  Service: Endoscopy;  Laterality: N/A;   Family History  Problem Relation Age of Onset  . Heart disease Father   . Hypertension Father   . Diabetes Father   . Diabetes Sister        x2  . Colon polyps Mother   . Breast cancer Neg Hx   . Colon cancer Neg Hx    Social History   Socioeconomic History  . Marital status: Married    Spouse name: Not on file  . Number of children: 3  . Years of education: Not on file  . Highest education level: Not on file  Occupational History    Employer: [employer]  Tobacco Use  . Smoking status: Never Smoker  . Smokeless tobacco: Never Used  Substance and Sexual Activity  . Alcohol use: No   Alcohol/week: 0.0 standard drinks  . Drug use: No  . Sexual activity: Not on file  Other Topics Concern  . Not on file  Social History Narrative  . Not on file   Social Determinants of Health   Financial Resource Strain:   . Difficulty of Paying Living Expenses: Not on file  Food Insecurity:   . Worried About Charity fundraiser in the Last Year: Not on file  . Ran Out of Food in the Last Year: Not on file  Transportation Needs:   . Lack of Transportation (Medical): Not on file  . Lack of Transportation (Non-Medical): Not on file  Physical Activity:   . Days of Exercise per Week: Not on file  . Minutes of Exercise per Session: Not on file  Stress:   . Feeling of Stress : Not on file  Social Connections:   . Frequency of Communication with Friends and Family: Not on file  . Frequency of Social Gatherings with Friends and Family: Not on file  . Attends Religious Services: Not on file  . Active Member of Clubs or Organizations: Not on file  . Attends Archivist Meetings: Not on file  . Marital Status: Not on file    Outpatient Encounter Medications as of 05/14/2019  Medication Sig  . amLODipine (  NORVASC) 5 MG tablet TAKE ONE TABLET EVERY DAY  . aspirin EC 81 MG tablet Take 81 mg by mouth daily.  . dorzolamide-timolol (COSOPT) 22.3-6.8 MG/ML ophthalmic solution   . glucose blood (GE100 BLOOD GLUCOSE TEST) test strip Used to test blood sugars daily.  Marland Kitchen lisinopril (ZESTRIL) 10 MG tablet TAKE ONE TABLET EVERY DAY  . metFORMIN (GLUCOPHAGE) 1000 MG tablet TAKE ONE TABLET TWICE DAILY WITH A MEAL  . Multiple Vitamin (MULTIVITAMIN) tablet Take 1 tablet by mouth daily.  Marland Kitchen nystatin cream (MYCOSTATIN) Apply 1 application topically 2 (two) times daily.  . rosuvastatin (CRESTOR) 10 MG tablet TAKE ONE TABLET EVERY DAY  . timolol (BETIMOL) 0.25 % ophthalmic solution 1-2 drops 2 (two) times daily.  Marland Kitchen triamcinolone cream (KENALOG) 0.1 % APPLY TO THE AFFECTED AREA DAILY AS NEEDED  .  triamterene-hydrochlorothiazide (DYAZIDE) 37.5-25 MG capsule TAKE 1 CAPSULE EVERY MORNING  . vitamin E 200 UNIT capsule Take 200 Units by mouth daily.   No facility-administered encounter medications on file as of 05/14/2019.    Review of Systems  Constitutional: Negative for appetite change and unexpected weight change.  HENT: Negative for congestion and sinus pressure.   Eyes: Negative for pain and visual disturbance.  Respiratory: Negative for cough, chest tightness and shortness of breath.   Cardiovascular: Negative for chest pain, palpitations and leg swelling.  Gastrointestinal: Negative for abdominal pain, diarrhea, nausea and vomiting.  Genitourinary: Negative for difficulty urinating and dysuria.  Musculoskeletal: Negative for joint swelling and myalgias.  Skin: Negative for color change.       Erythematous rash under left breast  Neurological: Negative for dizziness, light-headedness and headaches.  Hematological: Negative for adenopathy. Does not bruise/bleed easily.  Psychiatric/Behavioral: Negative for agitation and dysphoric mood.       Objective:    Physical Exam Constitutional:      General: She is not in acute distress.    Appearance: Normal appearance. She is well-developed.  HENT:     Head: Normocephalic and atraumatic.     Right Ear: External ear normal.     Left Ear: External ear normal.  Eyes:     General: No scleral icterus.       Right eye: No discharge.        Left eye: No discharge.     Conjunctiva/sclera: Conjunctivae normal.  Neck:     Thyroid: No thyromegaly.  Cardiovascular:     Rate and Rhythm: Normal rate and regular rhythm.  Pulmonary:     Effort: No tachypnea, accessory muscle usage or respiratory distress.     Breath sounds: Normal breath sounds. No decreased breath sounds or wheezing.  Chest:     Breasts:        Right: No inverted nipple, mass, nipple discharge or tenderness (no axillary adenopathy).        Left: No inverted nipple,  mass, nipple discharge or tenderness (no axilarry adenopathy).  Abdominal:     General: Bowel sounds are normal.     Palpations: Abdomen is soft.     Tenderness: There is no abdominal tenderness.  Musculoskeletal:        General: No swelling or tenderness.     Cervical back: Neck supple. No tenderness.  Lymphadenopathy:     Cervical: No cervical adenopathy.  Skin:    Findings: No erythema.     Comments: Erythematous rash under left breast.   Neurological:     Mental Status: She is alert and oriented to person, place, and time.  Psychiatric:        Mood and Affect: Mood normal.        Behavior: Behavior normal.     BP 124/70   Pulse 80   Temp (!) 95.7 F (35.4 C)   Resp 16   Ht _0  (1.549 m)   Wt 178 lb 12.8 oz (81.1 kg)   LMP 02/26/2008   SpO2 98%   BMI 33.78 kg/m  Wt Readings from Last 3 Encounters:  05/14/19 178 lb 12.8 oz (81.1 kg)  05/31/18 181 lb 6.4 oz (82.3 kg)  02/10/18 183 lb 9.6 oz (83.3 kg)     Lab Results  Component Value Date   WBC 6.6 05/29/2018   HGB 14.0 05/29/2018   HCT 41.2 05/29/2018   PLT 229.0 05/29/2018   GLUCOSE 154 (H) 05/18/2019   CHOL 111 05/18/2019   TRIG 141.0 05/18/2019   HDL 42.40 05/18/2019   LDLDIRECT 100.0 12/07/2017   LDLCALC 40 05/18/2019   ALT 32 05/18/2019   AST 23 05/18/2019   NA 139 05/18/2019   K 4.0 05/18/2019   CL 103 05/18/2019   CREATININE 0.83 05/18/2019   BUN 19 05/18/2019   CO2 26 05/18/2019   TSH 0.59 05/29/2018   HGBA1C 6.7 (H) 05/18/2019   MICROALBUR 1.0 05/29/2018    MM 3D SCREEN BREAST BILATERAL  Result Date: 12/18/2018 CLINICAL DATA:  Screening. EXAM: DIGITAL SCREENING BILATERAL MAMMOGRAM WITH TOMO AND CAD COMPARISON:  Previous exam(s). ACR Breast Density Category c: The breast tissue is heterogeneously dense, which may obscure small masses. FINDINGS: There are no findings suspicious for malignancy. Images were processed with CAD. IMPRESSION: No mammographic evidence of malignancy. A result letter  of this screening mammogram will be mailed directly to the patient. RECOMMENDATION: Screening mammogram in one year. (Code:SM-B-01Y) BI-RADS CATEGORY  1: Negative. Electronically Signed   By: Curlene Dolphin M.D.   On: 12/18/2018 14:14       Assessment & Plan:   Problem List Items Addressed This Visit    Diabetes mellitus ([place])    Low carb diet and exercise.  Follow met b and a1c.        Hair thinning    Can check thyroid.  If persistent, may need dermatology referral.       Health care maintenance    Physical today 05/14/19.  Mammogram 12/18/18 - Birads I.  Colonoscopy 12/12/17 - one diminutive polyp in the sigmoid colon, diverticulosis and internal hemorrhoids.  Pathology - sigmoid polyp (hyperplastic), diverticulosis and internal hemorrhoids.        Hypercholesteremia    On crestor.  Low cholesterol diet and exercise.  Follow lipid panel and liver function tests.        Rash    Rash beneath left breast.  Appears to be c/w yeast.  Treat with nystatin cream.  Notify me if persistent.        Stress    Handling things relatively well.  Follow.        Other Visit Diagnoses    Routine general medical examination at a health care facility    -  Primary       Einar Pheasant, MD

## 2019-05-14 NOTE — Assessment & Plan Note (Signed)
Physical today 05/14/19.  Mammogram 12/18/18 - Birads I.  Colonoscopy 12/12/17 - one diminutive polyp in the sigmoid colon, diverticulosis and internal hemorrhoids.  Pathology - sigmoid polyp (hyperplastic), diverticulosis and internal hemorrhoids.

## 2019-05-18 ENCOUNTER — Other Ambulatory Visit (INDEPENDENT_AMBULATORY_CARE_PROVIDER_SITE_OTHER): Payer: 59

## 2019-05-18 ENCOUNTER — Other Ambulatory Visit: Payer: Self-pay

## 2019-05-18 DIAGNOSIS — E7800 Pure hypercholesterolemia, unspecified: Secondary | ICD-10-CM

## 2019-05-18 DIAGNOSIS — E119 Type 2 diabetes mellitus without complications: Secondary | ICD-10-CM

## 2019-05-18 LAB — LIPID PANEL
Cholesterol: 111 mg/dL (ref 0–200)
HDL: 42.4 mg/dL (ref >39.00)
LDL Cholesterol: 40 mg/dL (ref 0–99)
NonHDL: 68.4
Total CHOL/HDL Ratio: 3
Triglycerides: 141 mg/dL (ref 0.0–149.0)
VLDL: 28.2 mg/dL (ref 0.0–40.0)

## 2019-05-18 LAB — BASIC METABOLIC PANEL
BUN: 19 mg/dL (ref 6–23)
CO2: 26 mEq/L (ref 19–32)
Calcium: 9.1 mg/dL (ref 8.4–10.5)
Chloride: 103 mEq/L (ref 96–112)
Creatinine, Ser: 0.83 mg/dL (ref 0.40–1.20)
GFR: 69.31 mL/min (ref >60.00)
Glucose, Bld: 154 mg/dL — ABNORMAL HIGH (ref 70–99)
Potassium: 4 mEq/L (ref 3.5–5.1)
Sodium: 139 mEq/L (ref 135–145)

## 2019-05-18 LAB — HEPATIC FUNCTION PANEL
ALT: 32 U/L (ref 0–35)
AST: 23 U/L (ref 0–37)
Albumin: 4.2 g/dL (ref 3.5–5.2)
Alkaline Phosphatase: 60 U/L (ref 39–117)
Bilirubin, Direct: 0.1 mg/dL (ref 0.0–0.3)
Total Bilirubin: 0.5 mg/dL (ref 0.2–1.2)
Total Protein: 7.2 g/dL (ref 6.0–8.3)

## 2019-05-18 LAB — HEMOGLOBIN A1C: Hgb A1c MFr Bld: 6.7 % — ABNORMAL HIGH (ref 4.6–6.5)

## 2019-05-20 ENCOUNTER — Encounter: Payer: Self-pay | Admitting: Internal Medicine

## 2019-05-20 DIAGNOSIS — R21 Rash and other nonspecific skin eruption: Secondary | ICD-10-CM | POA: Insufficient documentation

## 2019-05-20 DIAGNOSIS — L659 Nonscarring hair loss, unspecified: Secondary | ICD-10-CM | POA: Insufficient documentation

## 2019-05-20 NOTE — Assessment & Plan Note (Signed)
On crestor.  Low cholesterol diet and exercise.  Follow lipid panel and liver function tests.   

## 2019-05-20 NOTE — Assessment & Plan Note (Signed)
Can check thyroid.  If persistent, may need dermatology referral.

## 2019-05-20 NOTE — Assessment & Plan Note (Signed)
Rash beneath left breast.  Appears to be c/w yeast.  Treat with nystatin cream.  Notify me if persistent.

## 2019-05-20 NOTE — Assessment & Plan Note (Signed)
Handling things relatively well.  Follow.  

## 2019-05-20 NOTE — Assessment & Plan Note (Signed)
Low carb diet and exercise.  Follow met b and a1c.   

## 2019-05-21 ENCOUNTER — Telehealth: Payer: Self-pay | Admitting: Internal Medicine

## 2019-05-21 ENCOUNTER — Encounter: Payer: Self-pay | Admitting: Internal Medicine

## 2019-05-21 NOTE — Telephone Encounter (Signed)
Questions were regarding husband. Documented in his chart. Scheduled patient for a virtual visit tomorrow at 66- work in for sinus infection. No fever, chills, nausea, vomiting, SOB, etc. Only symptoms sinus pressure and yellowish drainage.

## 2019-05-21 NOTE — Telephone Encounter (Signed)
Pt was returning call to [place], Pt states that she has several questions and wants a call back.

## 2019-05-22 ENCOUNTER — Other Ambulatory Visit: Payer: Self-pay

## 2019-05-22 ENCOUNTER — Ambulatory Visit (INDEPENDENT_AMBULATORY_CARE_PROVIDER_SITE_OTHER): Payer: 59 | Admitting: Internal Medicine

## 2019-05-22 DIAGNOSIS — E119 Type 2 diabetes mellitus without complications: Secondary | ICD-10-CM | POA: Diagnosis not present

## 2019-05-22 DIAGNOSIS — E7800 Pure hypercholesterolemia, unspecified: Secondary | ICD-10-CM | POA: Diagnosis not present

## 2019-05-22 DIAGNOSIS — R0981 Nasal congestion: Secondary | ICD-10-CM

## 2019-05-22 MED ORDER — DOXYCYCLINE HYCLATE 100 MG PO TABS: 100.0000 mg | ORAL_TABLET | Freq: Two times a day (BID) | ORAL | 0 refills | Status: DC

## 2019-05-22 NOTE — Progress Notes (Signed)
[name], female   DOB: May 24, 1955, 64 y.o.   MRN: [id number]   Virtual Visit via video Note  This visit type was conducted due to national recommendations for restrictions regarding the COVID-19 pandemic (e.g. social distancing).  This format is felt to be most appropriate for this patient at this time.  All issues noted in this document were discussed and addressed.  No physical exam was performed (except for noted visual exam findings with Video Visits).   I connected with April Solis by a video enabled telemedicine application and verified that I am speaking with the correct person using two identifiers. Location patient: home Location provider: work Persons participating in the virtual visit: patient, provider  The limitations, risks, security and privacy concerns of performing an evaluation and management service by video and the availability of in person appointments have been discussed.   The patient expressed understanding and agreed to proceed.   Reason for visit: work in appt  HPI: States symptoms started a few days ago.  Increased congestion - yellow thick mucus.  Reports left maxillary pressure > right.  Head feels like in a barrel.  Frontal sinus pressure.  Minimal sore throat initially.  No sore throat now.  No chest pain or sob.  Minimal cough. Cough is mostly non productive.  (dry throat).  No nausea, vomiting or diarrhea.  Using saline nasal spray and gargling.  Feels like typical sinus infection.  Blood sugar - 121.     ROS: See pertinent positives and negatives per HPI.  Past Medical History:  Diagnosis Date  . Achilles rupture, right   . Diabetes mellitus without complication ([place])   . Environmental allergies   . Glaucoma   . History of chicken pox   . Hypertension     Past Surgical History:  Procedure Laterality Date  . BREAST BIOPSY Right 1999   neg  . COLONOSCOPY WITH PROPOFOL N/A 12/12/2017   Procedure: COLONOSCOPY WITH  PROPOFOL;  Surgeon: Manya Silvas, MD;  Location: [facility] ENDOSCOPY;  Service: Endoscopy;  Laterality: N/A;    Family History  Problem Relation Age of Onset  . Heart disease Father   . Hypertension Father   . Diabetes Father   . Diabetes Sister        x2  . Colon polyps Mother   . Breast cancer Neg Hx   . Colon cancer Neg Hx     SOCIAL HX: reviewed.    Current Outpatient Medications:  .  amLODipine (NORVASC) 5 MG tablet, TAKE ONE TABLET EVERY DAY, Disp: 90 tablet, Rfl: 0 .  aspirin EC 81 MG tablet, Take 81 mg by mouth daily., Disp: , Rfl:  .  dorzolamide-timolol (COSOPT) 22.3-6.8 MG/ML ophthalmic solution, , Disp: , Rfl:  .  doxycycline (VIBRA-TABS) 100 MG tablet, Take 1 tablet (100 mg total) by mouth 2 (two) times daily., Disp: 20 tablet, Rfl: 0 .  glucose blood (GE100 BLOOD GLUCOSE TEST) test strip, Used to test blood sugars daily., Disp: 100 each, Rfl: 2 .  lisinopril (ZESTRIL) 10 MG tablet, TAKE ONE TABLET EVERY DAY, Disp: 90 tablet, Rfl: 0 .  metFORMIN (GLUCOPHAGE) 1000 MG tablet, TAKE ONE TABLET TWICE DAILY WITH A MEAL, Disp: 60 tablet, Rfl: 3 .  Multiple Vitamin (MULTIVITAMIN) tablet, Take 1 tablet by mouth daily., Disp: , Rfl:  .  nystatin cream (MYCOSTATIN), Apply 1 application topically 2 (two) times daily., Disp: 30 g, Rfl: 0 .  rosuvastatin (CRESTOR) 10 MG tablet, TAKE ONE TABLET  EVERY DAY, Disp: 90 tablet, Rfl: 4 .  timolol (BETIMOL) 0.25 % ophthalmic solution, 1-2 drops 2 (two) times daily., Disp: , Rfl:  .  triamcinolone cream (KENALOG) 0.1 %, APPLY TO THE AFFECTED AREA DAILY AS NEEDED, Disp: 45 g, Rfl: 1 .  triamterene-hydrochlorothiazide (DYAZIDE) 37.5-25 MG capsule, TAKE 1 CAPSULE EVERY MORNING, Disp: 90 capsule, Rfl: 0 .  vitamin E 200 UNIT capsule, Take 200 Units by mouth daily., Disp: , Rfl:   EXAM:  GENERAL: alert, oriented, appears well and in no acute distress  HEENT: atraumatic, conjunttiva clear, no obvious abnormalities on inspection of external nose  and ears  NECK: normal movements of the head and neck  LUNGS: on inspection no signs of respiratory distress, breathing rate appears normal, no obvious gross SOB, gasping or wheezing  CV: no obvious cyanosis  PSYCH/NEURO: pleasant and cooperative, no obvious depression or anxiety, speech and thought processing grossly intact  ASSESSMENT AND PLAN:  Discussed the following assessment and plan:  Diabetes mellitus January sugar checks (outside checks) - averaging 120-140s fasting.  Continue low carb diet and exercise.  Follow met b and a1c.   Congestion of nasal sinus Symptoms as outlined.  Feels similar to her previous sinus infections.  Treat with nasacort nasal spray and saline nasal spray as directed.  Robitussin DM/mucinex as directed.  If symptoms worsen or persists, treat with doxycycline.  rx given to have if needed.  Probiotic as directed if takes abx.   Hypercholesteremia Low cholesterol diet and exercise.  On crestor.  Follow lipid panel and liver function tests.     Meds ordered this encounter  Medications  . doxycycline (VIBRA-TABS) 100 MG tablet    Sig: Take 1 tablet (100 mg total) by mouth 2 (two) times daily.    Dispense:  20 tablet    Refill:  0     I discussed the assessment and treatment plan with the patient. The patient was provided an opportunity to ask questions and all were answered. The patient agreed with the plan and demonstrated an understanding of the instructions.   The patient was advised to call back or seek an in-person evaluation if the symptoms worsen or if the condition fails to improve as anticipated.   Einar Pheasant, MD

## 2019-05-23 ENCOUNTER — Telehealth: Payer: Self-pay | Admitting: Internal Medicine

## 2019-05-23 NOTE — Telephone Encounter (Signed)
Patient tested negative for COVID on 05/22/2019.

## 2019-05-23 NOTE — Telephone Encounter (Signed)
FYI- COVID test came back negative.

## 2019-05-24 ENCOUNTER — Other Ambulatory Visit: Payer: Self-pay | Admitting: Internal Medicine

## 2019-05-26 DIAGNOSIS — R0981 Nasal congestion: Secondary | ICD-10-CM | POA: Insufficient documentation

## 2019-05-26 NOTE — Assessment & Plan Note (Addendum)
January sugar checks (outside checks) - averaging 120-140s fasting.  Continue low carb diet and exercise.  Follow met b and a1c.

## 2019-05-28 ENCOUNTER — Encounter: Payer: Self-pay | Admitting: Internal Medicine

## 2019-05-28 NOTE — Assessment & Plan Note (Signed)
Low cholesterol diet and exercise.  On crestor.  Follow lipid panel and liver function tests.  

## 2019-05-28 NOTE — Assessment & Plan Note (Signed)
Symptoms as outlined.  Feels similar to her previous sinus infections.  Treat with nasacort nasal spray and saline nasal spray as directed.  Robitussin DM/mucinex as directed.  If symptoms worsen or persists, treat with doxycycline.  rx given to have if needed.  Probiotic as directed if takes abx.

## 2019-07-17 IMAGING — MG MM DIGITAL SCREENING BILAT W/ TOMO W/ CAD
6 of 12 series · 6 of 36 positions shown · non-contrast
Comparison: Previous exam(s).

CLINICAL DATA: Screening.

EXAM:
DIGITAL SCREENING BILATERAL MAMMOGRAM WITH TOMO AND CAD

[L XCCL synth-2D]
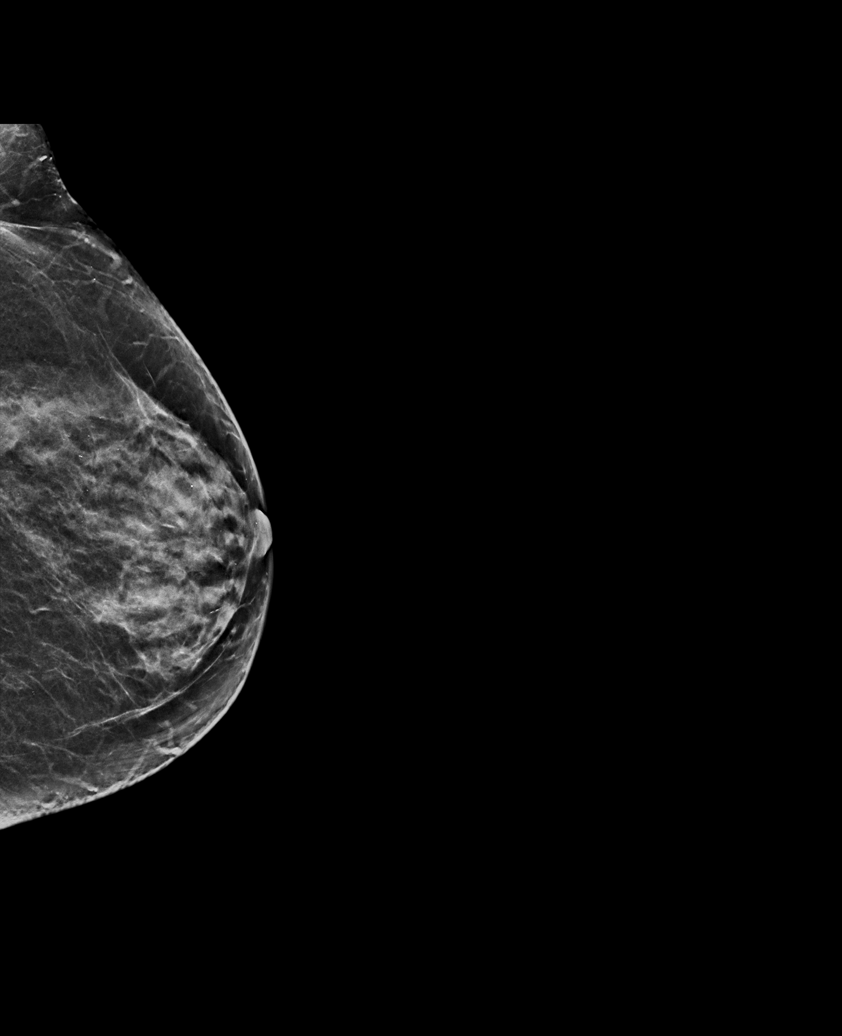

[R MLO synth-2D]
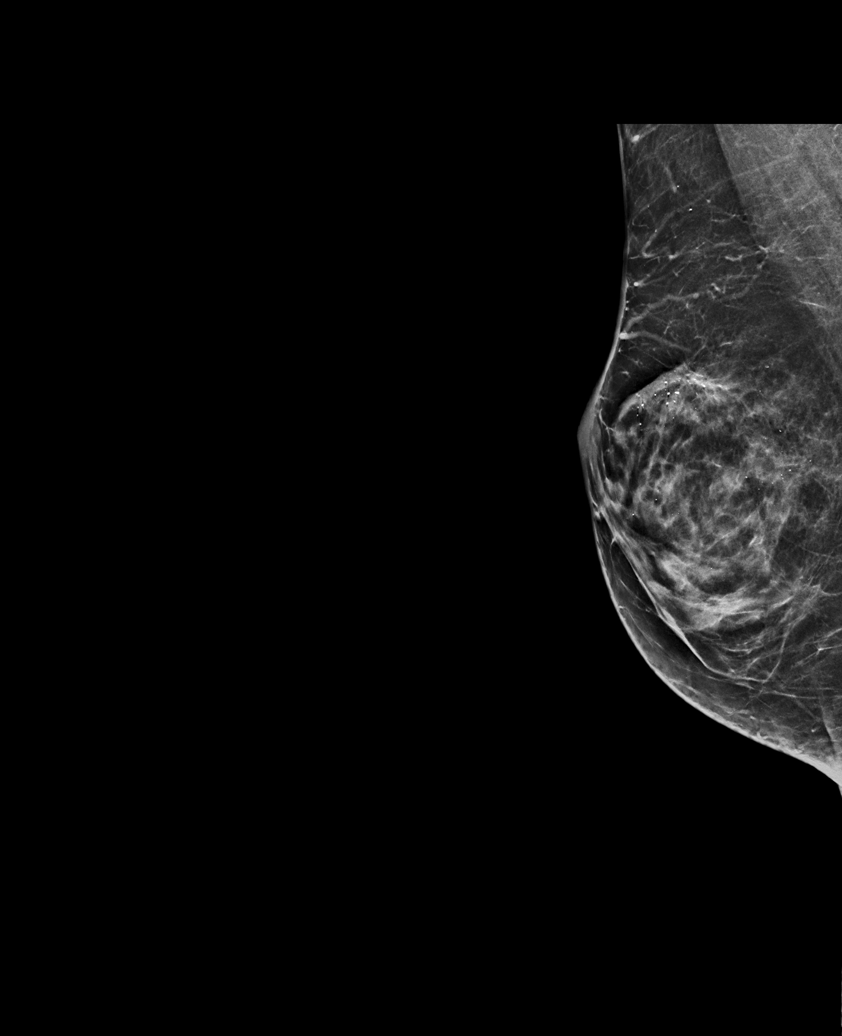

[R XCCL synth-2D]
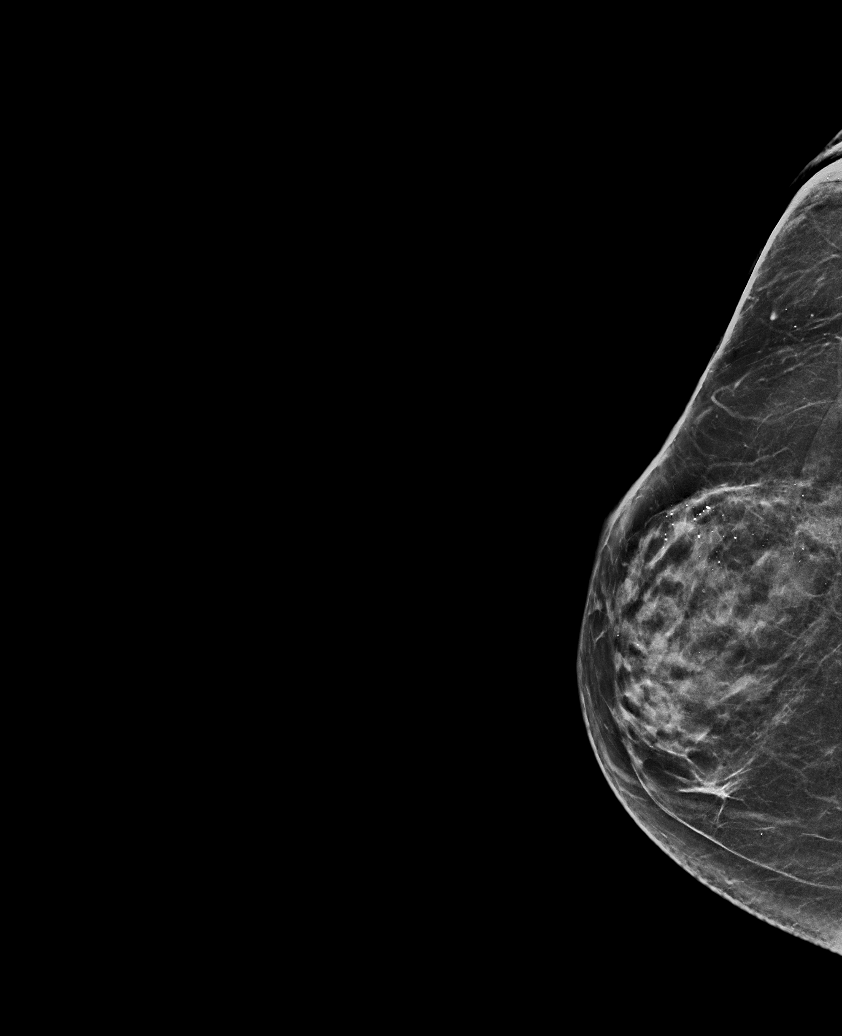

[R CC synth-2D]
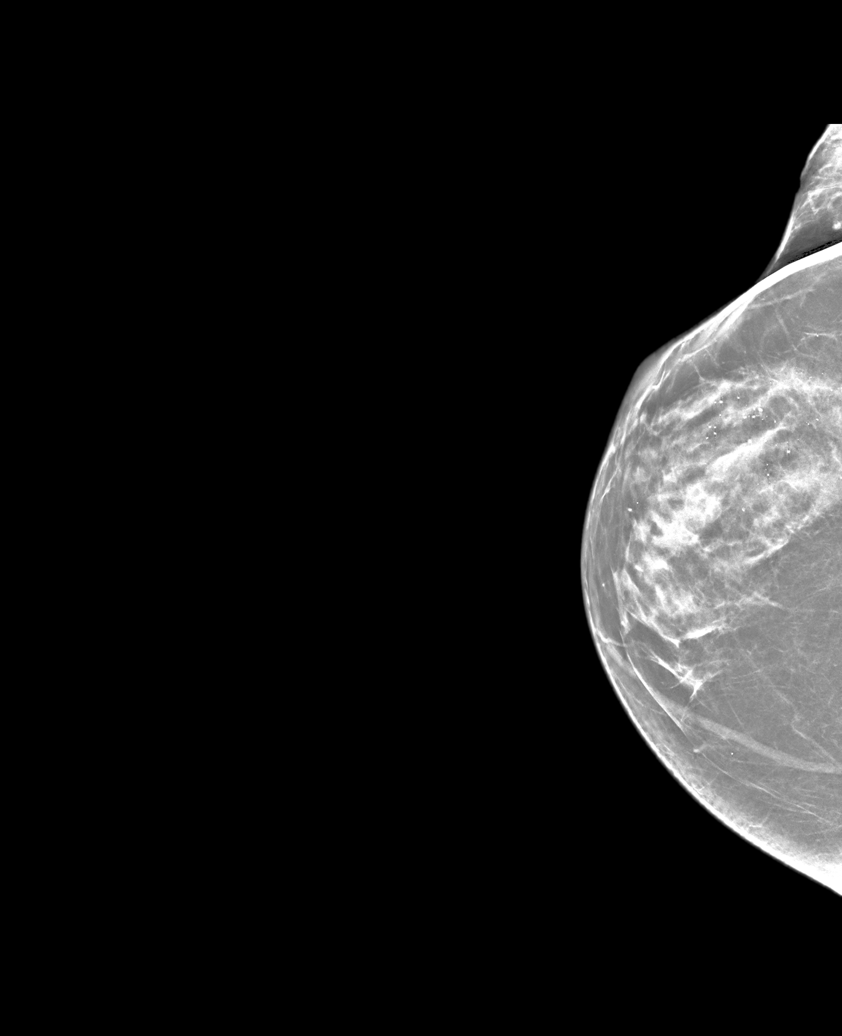

[L CC synth-2D]
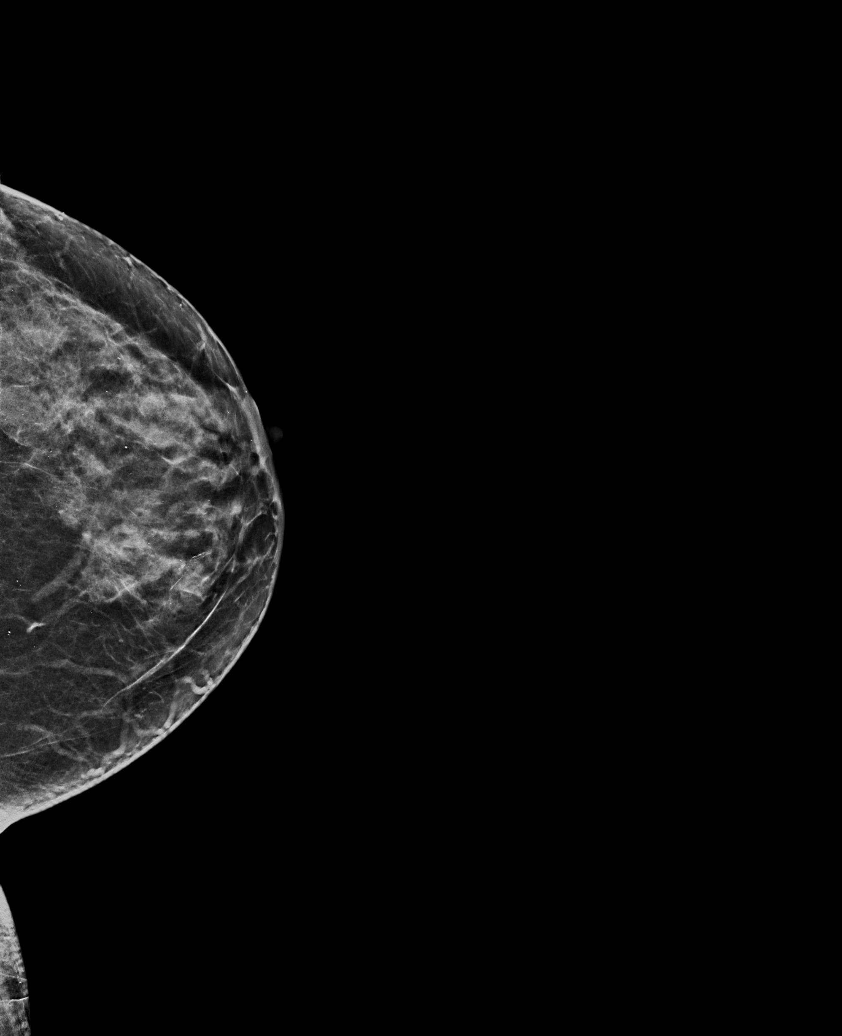

[L MLO synth-2D]
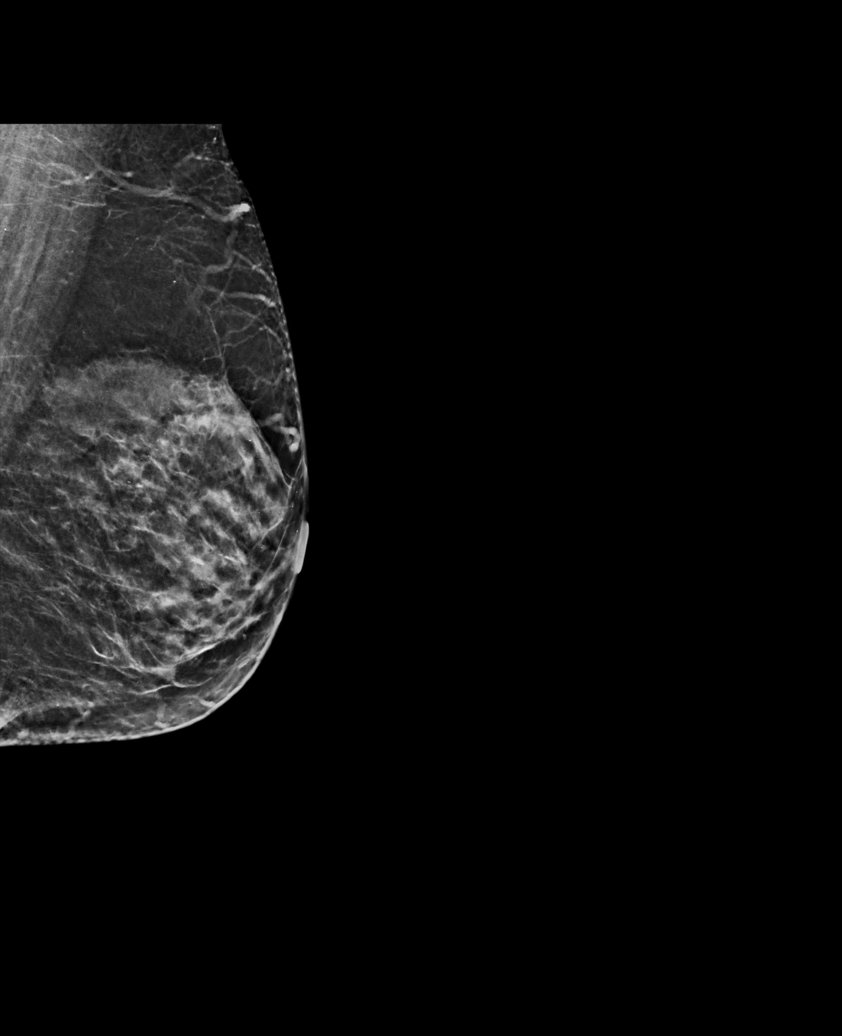

[6 of 36 positions shown; findings below may reference images not displayed]

ACR Breast Density Category c: The breast tissue is heterogeneously
dense, which may obscure small masses.
FINDINGS: There are no findings suspicious for malignancy. Images were
processed with CAD.
IMPRESSION: No mammographic evidence of malignancy. A result letter of this
screening mammogram will be mailed directly to the patient.

RECOMMENDATION:
Screening mammogram in one year. (Code:[id number])

BI-RADS CATEGORY  1: Negative.

## 2019-07-19 ENCOUNTER — Other Ambulatory Visit: Payer: Self-pay | Admitting: Dermatology

## 2019-07-24 ENCOUNTER — Other Ambulatory Visit: Payer: Self-pay | Admitting: Internal Medicine

## 2019-08-24 ENCOUNTER — Other Ambulatory Visit: Payer: Self-pay | Admitting: Internal Medicine

## 2019-09-13 ENCOUNTER — Telehealth (INDEPENDENT_AMBULATORY_CARE_PROVIDER_SITE_OTHER): Payer: 59 | Admitting: Internal Medicine

## 2019-09-13 ENCOUNTER — Encounter: Payer: Self-pay | Admitting: Internal Medicine

## 2019-09-13 ENCOUNTER — Other Ambulatory Visit: Payer: Self-pay

## 2019-09-13 VITALS — Ht 61.0 in | Wt 178.0 lb

## 2019-09-13 DIAGNOSIS — F439 Reaction to severe stress, unspecified: Secondary | ICD-10-CM | POA: Diagnosis not present

## 2019-09-13 DIAGNOSIS — I10 Essential (primary) hypertension: Secondary | ICD-10-CM

## 2019-09-13 DIAGNOSIS — E7800 Pure hypercholesterolemia, unspecified: Secondary | ICD-10-CM

## 2019-09-13 DIAGNOSIS — E119 Type 2 diabetes mellitus without complications: Secondary | ICD-10-CM

## 2019-09-13 DIAGNOSIS — Z1231 Encounter for screening mammogram for malignant neoplasm of breast: Secondary | ICD-10-CM | POA: Diagnosis not present

## 2019-09-13 NOTE — Assessment & Plan Note (Signed)
Handling stress.  Follow.   

## 2019-09-13 NOTE — Assessment & Plan Note (Signed)
On crestor.  Low cholesterol diet and exercise.  Follow lipid panel and liver function tests.   

## 2019-09-13 NOTE — Assessment & Plan Note (Signed)
Sugars as outlined.  On metformin.  Due labs.  Schedule fasting labs - met b and a1c.

## 2019-09-13 NOTE — Progress Notes (Signed)
[name], female   DOB: 1955/07/22, 64 y.o.   MRN: [id number]   Virtual Visit via video Note  This visit type was conducted due to national recommendations for restrictions regarding the COVID-19 pandemic (e.g. social distancing).  This format is felt to be most appropriate for this patient at this time.  All issues noted in this document were discussed and addressed.  No physical exam was performed (except for noted visual exam findings with Video Visits).   I connected with April Solis by a video enabled telemedicine application and verified that I am speaking with the correct person using two identifiers. Location patient: work Location provider: work  Persons participating in the virtual visit: patient, provider  The limitations, risks, security and privacy concerns of performing an evaluation and management service by telephone and the availability of in person appointments have been discussed.   It has also been discussed with the patient that there may be a patient responsible charge related to this service. The patient has expressed understanding and has agreed to proceed.   Reason for visit: follow up appt  HPI: Scheduled follow up.  Follow regarding her diabetes, hypertension and elevated cholesterol.  She reports she is doing well.  Handling stress.  Working. Stays physically active.  No chest pain or sob.  No acid reflux or abdominal pain reported.  Bowels moving.  Previous sinus congestion cleared.  No problems with allergies or sinus now.  States am sugars averaging 125-130.  Does spot check her blood pressure.  States it has been in the normal range.  She will schedule her mammogram.    ROS: See pertinent positives and negatives per HPI.  Past Medical History:  Diagnosis Date  . Achilles rupture, right   . Diabetes mellitus without complication ([place])   . Environmental allergies   . Glaucoma   . History of chicken pox   . Hypertension      Past Surgical History:  Procedure Laterality Date  . BREAST BIOPSY Right 1999   neg  . COLONOSCOPY WITH PROPOFOL N/A 12/12/2017   Procedure: COLONOSCOPY WITH PROPOFOL;  Surgeon: Manya Silvas, MD;  Location: [facility] ENDOSCOPY;  Service: Endoscopy;  Laterality: N/A;    Family History  Problem Relation Age of Onset  . Heart disease Father   . Hypertension Father   . Diabetes Father   . Diabetes Sister        x2  . Colon polyps Mother   . Breast cancer Neg Hx   . Colon cancer Neg Hx     SOCIAL HX: reviewed.    Current Outpatient Medications:  .  amLODipine (NORVASC) 5 MG tablet, TAKE ONE TABLET EVERY DAY, Disp: 90 tablet, Rfl: 0 .  aspirin EC 81 MG tablet, Take 81 mg by mouth daily., Disp: , Rfl:  .  dorzolamide-timolol (COSOPT) 22.3-6.8 MG/ML ophthalmic solution, , Disp: , Rfl:  .  doxycycline (VIBRA-TABS) 100 MG tablet, Take 1 tablet (100 mg total) by mouth 2 (two) times daily., Disp: 20 tablet, Rfl: 0 .  glucose blood (GE100 BLOOD GLUCOSE TEST) test strip, Used to test blood sugars daily., Disp: 100 each, Rfl: 2 .  lisinopril (ZESTRIL) 10 MG tablet, TAKE ONE TABLET EVERY DAY, Disp: 90 tablet, Rfl: 0 .  metFORMIN (GLUCOPHAGE) 1000 MG tablet, TAKE ONE TABLET TWICE DAILY WITH A MEAL, Disp: 60 tablet, Rfl: 3 .  Multiple Vitamin (MULTIVITAMIN) tablet, Take 1 tablet by mouth daily., Disp: , Rfl:  .  nystatin cream (  MYCOSTATIN), Apply 1 application topically 2 (two) times daily., Disp: 30 g, Rfl: 0 .  rosuvastatin (CRESTOR) 10 MG tablet, TAKE ONE TABLET EVERY DAY, Disp: 90 tablet, Rfl: 4 .  timolol (BETIMOL) 0.25 % ophthalmic solution, 1-2 drops 2 (two) times daily., Disp: , Rfl:  .  triamcinolone cream (KENALOG) 0.1 %, APPLY TO THE AFFECTED AREA DAILY AS NEEDED, Disp: 45 g, Rfl: 1 .  triamterene-hydrochlorothiazide (DYAZIDE) 37.5-25 MG capsule, TAKE 1 CAPSULE EVERY MORNING, Disp: 90 capsule, Rfl: 0 .  vitamin E 200 UNIT capsule, Take 200 Units by mouth daily., Disp: , Rfl:    EXAM:  VITALS per patient if applicable: reports pressures approximately 120s/70  GENERAL: alert, oriented, appears well and in no acute distress  HEENT: atraumatic, conjunttiva clear, no obvious abnormalities on inspection of external nose and ears  NECK: normal movements of the head and neck  LUNGS: on inspection no signs of respiratory distress, breathing rate appears normal, no obvious gross SOB, gasping or wheezing  CV: no obvious cyanosis  PSYCH/NEURO: pleasant and cooperative, no obvious depression or anxiety, speech and thought processing grossly intact  ASSESSMENT AND PLAN:  Discussed the following assessment and plan:  Diabetes mellitus Sugars as outlined.  On metformin.  Due labs.  Schedule fasting labs - met b and a1c.    Stress Handling stress.  Follow.    Hypertension Blood pressures reported as outlined.  Continue lisinopril.  Follow pressures.  Follow metabolic panel.    Hypercholesteremia On crestor.  Low cholesterol diet and exercise.  Follow lipid panel and liver function tests.     Orders Placed This Encounter  Procedures  . MM 3D SCREEN BREAST BILATERAL    Standing Status:   Future    Standing Expiration Date:   09/12/2020    Order Specific Question:   Reason for Exam (SYMPTOM  OR DIAGNOSIS REQUIRED)    Answer:   breast cancer screening    Order Specific Question:   Preferred imaging location?    Answer:   [facility]  . CBC with Differential/Platelet    Standing Status:   Future    Standing Expiration Date:   09/12/2020  . Hemoglobin A1c    Standing Status:   Future    Standing Expiration Date:   09/12/2020  . Hepatic function panel    Standing Status:   Future    Standing Expiration Date:   09/12/2020  . Lipid panel    Standing Status:   Future    Standing Expiration Date:   09/12/2020  . TSH    Standing Status:   Future    Standing Expiration Date:   09/12/2020  . Basic metabolic panel    Standing Status:   Future    Standing  Expiration Date:   09/12/2020  . Microalbumin / creatinine urine ratio    Standing Status:   Future    Standing Expiration Date:   09/12/2020     I discussed the assessment and treatment plan with the patient. The patient was provided an opportunity to ask questions and all were answered. The patient agreed with the plan and demonstrated an understanding of the instructions.   The patient was advised to call back or seek an in-person evaluation if the symptoms worsen or if the condition fails to improve as anticipated.   Einar Pheasant, MD

## 2019-09-13 NOTE — Assessment & Plan Note (Signed)
Blood pressures reported as outlined.  Continue lisinopril.  Follow pressures.  Follow metabolic panel.

## 2019-09-20 ENCOUNTER — Other Ambulatory Visit: Payer: Self-pay

## 2019-09-20 ENCOUNTER — Other Ambulatory Visit (INDEPENDENT_AMBULATORY_CARE_PROVIDER_SITE_OTHER): Payer: 59

## 2019-09-20 ENCOUNTER — Telehealth: Payer: Self-pay | Admitting: Internal Medicine

## 2019-09-20 DIAGNOSIS — E7800 Pure hypercholesterolemia, unspecified: Secondary | ICD-10-CM

## 2019-09-20 DIAGNOSIS — E119 Type 2 diabetes mellitus without complications: Secondary | ICD-10-CM

## 2019-09-20 DIAGNOSIS — I10 Essential (primary) hypertension: Secondary | ICD-10-CM | POA: Diagnosis not present

## 2019-09-20 LAB — CBC WITH DIFFERENTIAL/PLATELET
Basophils Absolute: 0 10*3/uL (ref 0.0–0.1)
Basophils Relative: 0.8 % (ref 0.0–3.0)
Eosinophils Absolute: 0.3 10*3/uL (ref 0.0–0.7)
Eosinophils Relative: 4.5 % (ref 0.0–5.0)
HCT: 39.5 % (ref 36.0–46.0)
Hemoglobin: 13.5 g/dL (ref 12.0–15.0)
Lymphocytes Relative: 39.7 % (ref 12.0–46.0)
Lymphs Abs: 2.4 10*3/uL (ref 0.7–4.0)
MCHC: 34.2 g/dL (ref 30.0–36.0)
MCV: 92 fl (ref 78.0–100.0)
Monocytes Absolute: 0.7 10*3/uL (ref 0.1–1.0)
Monocytes Relative: 11.6 % (ref 3.0–12.0)
Neutro Abs: 2.6 10*3/uL (ref 1.4–7.7)
Neutrophils Relative %: 43.4 % (ref 43.0–77.0)
Platelets: 203 10*3/uL (ref 150.0–400.0)
RBC: 4.3 Mil/uL (ref 3.87–5.11)
RDW: 13.4 % (ref 11.5–15.5)
WBC: 6.1 10*3/uL (ref 4.0–10.5)

## 2019-09-20 LAB — HEPATIC FUNCTION PANEL
ALT: 28 U/L (ref 0–35)
AST: 23 U/L (ref 0–37)
Albumin: 4.4 g/dL (ref 3.5–5.2)
Alkaline Phosphatase: 57 U/L (ref 39–117)
Bilirubin, Direct: 0.1 mg/dL (ref 0.0–0.3)
Total Bilirubin: 0.5 mg/dL (ref 0.2–1.2)
Total Protein: 7.1 g/dL (ref 6.0–8.3)

## 2019-09-20 LAB — LIPID PANEL
Cholesterol: 108 mg/dL (ref 0–200)
HDL: 44.4 mg/dL (ref >39.00)
LDL Cholesterol: 29 mg/dL (ref 0–99)
NonHDL: 63.72
Total CHOL/HDL Ratio: 2
Triglycerides: 175 mg/dL — ABNORMAL HIGH (ref 0.0–149.0)
VLDL: 35 mg/dL (ref 0.0–40.0)

## 2019-09-20 LAB — BASIC METABOLIC PANEL
BUN: 18 mg/dL (ref 6–23)
CO2: 27 mEq/L (ref 19–32)
Calcium: 9 mg/dL (ref 8.4–10.5)
Chloride: 103 mEq/L (ref 96–112)
Creatinine, Ser: 0.8 mg/dL (ref 0.40–1.20)
GFR: 72.24 mL/min (ref >60.00)
Glucose, Bld: 142 mg/dL — ABNORMAL HIGH (ref 70–99)
Potassium: 3.9 mEq/L (ref 3.5–5.1)
Sodium: 137 mEq/L (ref 135–145)

## 2019-09-20 LAB — TSH: TSH: 0.63 u[IU]/mL (ref 0.35–4.50)

## 2019-09-20 LAB — MICROALBUMIN / CREATININE URINE RATIO
Creatinine,U: 120.2 mg/dL
Microalb Creat Ratio: 0.6 mg/g (ref 0.0–30.0)
Microalb, Ur: 0.8 mg/dL (ref 0.0–1.9)

## 2019-09-20 LAB — HEMOGLOBIN A1C: Hgb A1c MFr Bld: 6.5 % (ref 4.6–6.5)

## 2019-09-20 NOTE — Telephone Encounter (Signed)
Reviewed range of sugars and reviewed recent labs.  Overall sugars control ok and a1c improved 6.5.  see lab results.  Continue low carb diet and exercise.  We will follow.

## 2019-09-20 NOTE — Telephone Encounter (Signed)
Sugars ranging 117-150. Checking once daily. Placed in results folder for review

## 2019-09-20 NOTE — Telephone Encounter (Signed)
Patient dropped of sugar readings. Readings handed [place].

## 2019-09-21 NOTE — Telephone Encounter (Signed)
LMTCB

## 2019-09-21 NOTE — Telephone Encounter (Signed)
Pt aware.

## 2019-09-21 NOTE — Telephone Encounter (Signed)
Pt returned your call. Would like a call back ?

## 2019-10-22 DIAGNOSIS — H401134 Primary open-angle glaucoma, bilateral, indeterminate stage: Secondary | ICD-10-CM | POA: Diagnosis not present

## 2019-10-22 DIAGNOSIS — E119 Type 2 diabetes mellitus without complications: Secondary | ICD-10-CM | POA: Diagnosis not present

## 2019-10-26 ENCOUNTER — Other Ambulatory Visit: Payer: Self-pay | Admitting: Internal Medicine

## 2019-10-31 ENCOUNTER — Encounter: Payer: Self-pay | Admitting: Internal Medicine

## 2019-10-31 NOTE — Telephone Encounter (Signed)
See me before calling.  Please call and find out her specific symptoms, reaction to flu shot.  Was she evaluated at the time?

## 2019-11-02 NOTE — Telephone Encounter (Signed)
Within 4 hours, she developed the flu. Within 2 hours of getting the flu shot she developed a temp on 104. She was evaluated by her current primary physician both times she had this reaction and was instructed by them not to take anymore vaccines. She is exempt from getting the flu shot with [facility].

## 2019-11-03 NOTE — Telephone Encounter (Signed)
Need copy of exemption form - for completion.

## 2019-11-06 NOTE — Telephone Encounter (Signed)
Pt dropped off covid vaccination exemption form to be completed. Placed in colored folder in front office.

## 2019-11-07 NOTE — Telephone Encounter (Signed)
See me before calling pt.  Confirm with pt that she is not allergic to miralax or bowel preps for colonoscopy.  A couple of things about her form.  To my knowledge, she has not had the covid vaccine and had a reaction to it or any of its ingredients.  She checked this as the reason for exemption.  Please confirm.  Also, a committee determines if she is exempt.  (would be just supplying information that she is allergic to flu shot).  Form placed back in box for clarification.

## 2019-11-20 NOTE — Telephone Encounter (Signed)
Patient stated she is not allergic to miralax or colonoscopy prep. She has not had the covid vaccine. No reaction to ingredients. She is requesting to be expempt due to flu shot allergy

## 2019-11-21 NOTE — Telephone Encounter (Signed)
Pt aware. Forms given to Dr Nicki Reaper to complete based off of in formation that we have. Patient is going to bring by new form to replace one that was filled out incorrectly.

## 2019-11-21 NOTE — Telephone Encounter (Signed)
As per previous message and discussion, please contact pt and explain that I cannot sign the form she submitted - states allergic to covid.  Can only submit information - committee decides if exempt.

## 2019-12-11 ENCOUNTER — Other Ambulatory Visit: Payer: Self-pay | Admitting: Internal Medicine

## 2019-12-25 ENCOUNTER — Ambulatory Visit
Admission: RE | Admit: 2019-12-25 | Discharge: 2019-12-25 | Disposition: A | Discharge status: Home / Self Care | Payer: 59 | Source: Ambulatory Visit | Attending: Internal Medicine | Admitting: Internal Medicine

## 2019-12-25 ENCOUNTER — Other Ambulatory Visit: Payer: Self-pay | Admitting: Internal Medicine

## 2019-12-25 ENCOUNTER — Other Ambulatory Visit: Payer: Self-pay

## 2019-12-25 DIAGNOSIS — Z1231 Encounter for screening mammogram for malignant neoplasm of breast: Secondary | ICD-10-CM | POA: Diagnosis not present

## 2020-01-16 ENCOUNTER — Ambulatory Visit (INDEPENDENT_AMBULATORY_CARE_PROVIDER_SITE_OTHER): Payer: 59 | Admitting: Internal Medicine

## 2020-01-16 ENCOUNTER — Encounter: Payer: Self-pay | Admitting: Internal Medicine

## 2020-01-16 ENCOUNTER — Other Ambulatory Visit: Payer: Self-pay

## 2020-01-16 VITALS — BP 126/70 | HR 78 | Temp 97.7°F | Resp 16 | Ht 61.0 in | Wt 176.0 lb

## 2020-01-16 DIAGNOSIS — E1165 Type 2 diabetes mellitus with hyperglycemia: Secondary | ICD-10-CM | POA: Diagnosis not present

## 2020-01-16 DIAGNOSIS — I10 Essential (primary) hypertension: Secondary | ICD-10-CM

## 2020-01-16 DIAGNOSIS — F439 Reaction to severe stress, unspecified: Secondary | ICD-10-CM

## 2020-01-16 DIAGNOSIS — E7800 Pure hypercholesterolemia, unspecified: Secondary | ICD-10-CM | POA: Diagnosis not present

## 2020-01-16 LAB — BASIC METABOLIC PANEL
BUN: 18 mg/dL (ref 6–23)
CO2: 26 mEq/L (ref 19–32)
Calcium: 8.9 mg/dL (ref 8.4–10.5)
Chloride: 103 mEq/L (ref 96–112)
Creatinine, Ser: 0.78 mg/dL (ref 0.40–1.20)
GFR: 80.21 mL/min (ref >60.00)
Glucose, Bld: 144 mg/dL — ABNORMAL HIGH (ref 70–99)
Potassium: 4.1 mEq/L (ref 3.5–5.1)
Sodium: 138 mEq/L (ref 135–145)

## 2020-01-16 LAB — LIPID PANEL
Cholesterol: 98 mg/dL (ref 0–200)
HDL: 39.4 mg/dL (ref >39.00)
LDL Cholesterol: 28 mg/dL (ref 0–99)
NonHDL: 58.84
Total CHOL/HDL Ratio: 2
Triglycerides: 156 mg/dL — ABNORMAL HIGH (ref 0.0–149.0)
VLDL: 31.2 mg/dL (ref 0.0–40.0)

## 2020-01-16 LAB — HEPATIC FUNCTION PANEL
ALT: 34 U/L (ref 0–35)
AST: 25 U/L (ref 0–37)
Albumin: 4.2 g/dL (ref 3.5–5.2)
Alkaline Phosphatase: 58 U/L (ref 39–117)
Bilirubin, Direct: 0.1 mg/dL (ref 0.0–0.3)
Total Bilirubin: 0.3 mg/dL (ref 0.2–1.2)
Total Protein: 6.6 g/dL (ref 6.0–8.3)

## 2020-01-16 LAB — HEMOGLOBIN A1C: Hgb A1c MFr Bld: 7.2 % — ABNORMAL HIGH (ref 4.6–6.5)

## 2020-01-16 NOTE — Progress Notes (Signed)
[name], female   DOB: 08-Sep-1955, 64 y.o.   MRN: [id number]   Subjective:    [name], female    DOB: [birth year], 64 y.o.   MRN: [id number]  HPI This visit occurred during the SARS-CoV-2 public health emergency.  Safety protocols were in place, including screening questions prior to the visit, additional usage of staff PPE, and extensive cleaning of exam room while observing appropriate contact time as indicated for disinfecting solutions.  Patient here for a scheduled follow up.  Here to f/u regarding her diabetes and cholesterol.  She reports she is doing relatively well.  Increased stress.  Husband recently hospitalized with CVA.  Also working 6 days per week.  Overall she feels she is handling things well.  Does not feel needs any further intervention at this time.  No chest pain or sob.  No acid reflux.  No abdominal pain.  Bowels moving.    Past Medical History:  Diagnosis Date  . Achilles rupture, right   . Diabetes mellitus without complication ([place])   . Environmental allergies   . Glaucoma   . History of chicken pox   . Hypertension    Past Surgical History:  Procedure Laterality Date  . BREAST BIOPSY Right 1999   neg  . COLONOSCOPY WITH PROPOFOL N/A 12/12/2017   Procedure: COLONOSCOPY WITH PROPOFOL;  Surgeon: Manya Silvas, MD;  Location: [facility] ENDOSCOPY;  Service: Endoscopy;  Laterality: N/A;   Family History  Problem Relation Age of Onset  . Heart disease Father   . Hypertension Father   . Diabetes Father   . Diabetes Sister        x2  . Colon polyps Mother   . Breast cancer Neg Hx   . Colon cancer Neg Hx    Social History   Socioeconomic History  . Marital status: Married    Spouse name: Not on file  . Number of children: 3  . Years of education: Not on file  . Highest education level: Not on file  Occupational History    Employer: [employer]  Tobacco Use  . Smoking status: Never Smoker  . Smokeless  tobacco: Never Used  Substance and Sexual Activity  . Alcohol use: No    Alcohol/week: 0.0 standard drinks  . Drug use: No  . Sexual activity: Not on file  Other Topics Concern  . Not on file  Social History Narrative  . Not on file   Social Determinants of Health   Financial Resource Strain:   . Difficulty of Paying Living Expenses: Not on file  Food Insecurity:   . Worried About Charity fundraiser in the Last Year: Not on file  . Ran Out of Food in the Last Year: Not on file  Transportation Needs:   . Lack of Transportation (Medical): Not on file  . Lack of Transportation (Non-Medical): Not on file  Physical Activity:   . Days of Exercise per Week: Not on file  . Minutes of Exercise per Session: Not on file  Stress:   . Feeling of Stress : Not on file  Social Connections:   . Frequency of Communication with Friends and Family: Not on file  . Frequency of Social Gatherings with Friends and Family: Not on file  . Attends Religious Services: Not on file  . Active Member of Clubs or Organizations: Not on file  . Attends Archivist Meetings: Not on file  . Marital Status: Not on  file    Outpatient Encounter Medications as of 01/16/2020  Medication Sig  . amLODipine (NORVASC) 5 MG tablet TAKE ONE TABLET EVERY DAY  . aspirin EC 81 MG tablet Take 81 mg by mouth daily.  . dorzolamide-timolol (COSOPT) 22.3-6.8 MG/ML ophthalmic solution   . glucose blood (GE100 BLOOD GLUCOSE TEST) test strip Used to test blood sugars daily.  Marland Kitchen lisinopril (ZESTRIL) 10 MG tablet TAKE ONE TABLET EVERY DAY  . metFORMIN (GLUCOPHAGE) 1000 MG tablet TAKE ONE TABLET TWICE DAILY WITH A MEAL  . Multiple Vitamin (MULTIVITAMIN) tablet Take 1 tablet by mouth daily.  Marland Kitchen nystatin cream (MYCOSTATIN) Apply 1 application topically 2 (two) times daily.  . rosuvastatin (CRESTOR) 10 MG tablet TAKE ONE TABLET EVERY DAY  . timolol (BETIMOL) 0.25 % ophthalmic solution 1-2 drops 2 (two) times daily.  Marland Kitchen  triamcinolone cream (KENALOG) 0.1 % APPLY TO THE AFFECTED AREA DAILY AS NEEDED  . triamterene-hydrochlorothiazide (DYAZIDE) 37.5-25 MG capsule TAKE 1 CAPSULE EVERY MORNING  . vitamin E 200 UNIT capsule Take 200 Units by mouth daily.  . [DISCONTINUED] doxycycline (VIBRA-TABS) 100 MG tablet Take 1 tablet (100 mg total) by mouth 2 (two) times daily.   No facility-administered encounter medications on file as of 01/16/2020.    Review of Systems  Constitutional: Negative for appetite change and unexpected weight change.  HENT: Negative for congestion and sinus pressure.   Respiratory: Negative for cough, chest tightness and shortness of breath.   Cardiovascular: Negative for chest pain, palpitations and leg swelling.  Gastrointestinal: Negative for abdominal pain, diarrhea, nausea and vomiting.  Genitourinary: Negative for difficulty urinating and dysuria.  Musculoskeletal: Negative for joint swelling and myalgias.  Skin: Negative for color change and rash.  Neurological: Negative for dizziness, light-headedness and headaches.  Psychiatric/Behavioral: Negative for agitation and dysphoric mood.       Objective:    Physical Exam Vitals reviewed.  Constitutional:      General: She is not in acute distress.    Appearance: Normal appearance.  HENT:     Head: Normocephalic and atraumatic.     Right Ear: External ear normal.     Left Ear: External ear normal.  Eyes:     General: No scleral icterus.       Right eye: No discharge.        Left eye: No discharge.     Conjunctiva/sclera: Conjunctivae normal.  Neck:     Thyroid: No thyromegaly.  Cardiovascular:     Rate and Rhythm: Normal rate and regular rhythm.  Pulmonary:     Effort: No respiratory distress.     Breath sounds: Normal breath sounds. No wheezing.  Abdominal:     General: Bowel sounds are normal.     Palpations: Abdomen is soft.     Tenderness: There is no abdominal tenderness.  Musculoskeletal:        General: No  swelling or tenderness.     Cervical back: Neck supple.  Lymphadenopathy:     Cervical: No cervical adenopathy.  Skin:    Findings: No erythema or rash.  Neurological:     Mental Status: She is alert.  Psychiatric:        Mood and Affect: Mood normal.        Behavior: Behavior normal.     BP 126/70   Pulse 78   Temp 97.7 F (36.5 C) (Oral)   Resp 16   Ht $R'5\' 1"'Ea$  (1.549 m)   Wt 176 lb (79.8 kg)   LMP  02/26/2008   SpO2 98%   BMI 33.25 kg/m  Wt Readings from Last 3 Encounters:  01/16/20 176 lb (79.8 kg)  09/13/19 178 lb (80.7 kg)  05/14/19 178 lb 12.8 oz (81.1 kg)     Lab Results  Component Value Date   WBC 6.1 09/20/2019   HGB 13.5 09/20/2019   HCT 39.5 09/20/2019   PLT 203.0 09/20/2019   GLUCOSE 144 (H) 01/16/2020   CHOL 98 01/16/2020   TRIG 156.0 (H) 01/16/2020   HDL 39.40 01/16/2020   LDLDIRECT 100.0 12/07/2017   LDLCALC 28 01/16/2020   ALT 34 01/16/2020   AST 25 01/16/2020   NA 138 01/16/2020   K 4.1 01/16/2020   CL 103 01/16/2020   CREATININE 0.78 01/16/2020   BUN 18 01/16/2020   CO2 26 01/16/2020   TSH 0.63 09/20/2019   HGBA1C 7.2 (H) 01/16/2020   MICROALBUR 0.8 09/20/2019    MM 3D SCREEN BREAST BILATERAL  Result Date: 12/26/2019 CLINICAL DATA:  Screening. EXAM: DIGITAL SCREENING BILATERAL MAMMOGRAM WITH TOMO AND CAD COMPARISON:  Previous exam(s). ACR Breast Density Category c: The breast tissue is heterogeneously dense, which may obscure small masses. FINDINGS: There are no findings suspicious for malignancy. Images were processed with CAD. IMPRESSION: No mammographic evidence of malignancy. A result letter of this screening mammogram will be mailed directly to the patient. RECOMMENDATION: Screening mammogram in one year. (Code:SM-B-01Y) BI-RADS CATEGORY  1: Negative. Electronically Signed   By: Kristopher Oppenheim M.D.   On: 12/26/2019 10:32       Assessment & Plan:   Problem List Items Addressed This Visit    Stress    Increased stress as outlined.   Discussed with her today.  She does not feel needs any further intervention.  Follow.        Hypertension    Blood pressure under good control.  Continues on lisinopril, triam/hctz and amlodipine.  Follow pressures.  Follow metabolic panel.       Relevant Orders   Basic metabolic panel (Completed)   Hypercholesteremia    On crestor.  Low cholesterol diet and exercise. Follow lipid panel and liver function tests.        Relevant Orders   Hepatic function panel (Completed)   Lipid panel (Completed)   Diabetes mellitus ([place]) - Primary    Reviewed outside sugar readings.  Low carb diet and exercise.  AM sugars averaging 130-160s.  Follow met b and a1c.  On metformin.       Relevant Orders   Hemoglobin A1c (Completed)       Einar Pheasant, MD

## 2020-01-20 ENCOUNTER — Encounter: Payer: Self-pay | Admitting: Internal Medicine

## 2020-01-20 NOTE — Assessment & Plan Note (Signed)
Blood pressure under good control.  Continues on lisinopril, triam/hctz and amlodipine.  Follow pressures.  Follow metabolic panel.

## 2020-01-20 NOTE — Assessment & Plan Note (Signed)
On crestor.  Low cholesterol diet and exercise.  Follow lipid panel and liver function tests.   

## 2020-01-20 NOTE — Assessment & Plan Note (Signed)
Increased stress as outlined.  Discussed with her today.  She does not feel needs any further intervention.  Follow.   

## 2020-01-20 NOTE — Assessment & Plan Note (Addendum)
Reviewed outside sugar readings.  Low carb diet and exercise.  AM sugars averaging 130-160s.  Follow met b and a1c.  On metformin.

## 2020-01-21 ENCOUNTER — Telehealth: Payer: Self-pay | Admitting: Internal Medicine

## 2020-01-21 ENCOUNTER — Other Ambulatory Visit: Payer: Self-pay

## 2020-01-21 MED ORDER — TRIAMCINOLONE ACETONIDE 0.1 % EX CREA
TOPICAL_CREAM | CUTANEOUS | 1 refills | Status: AC
Start: 2020-01-21 — End: ?

## 2020-01-21 NOTE — Telephone Encounter (Signed)
See note on husband

## 2020-01-21 NOTE — Telephone Encounter (Signed)
Patient was returning call for results 

## 2020-04-24 ENCOUNTER — Other Ambulatory Visit: Payer: Self-pay | Admitting: Internal Medicine

## 2020-05-13 DIAGNOSIS — H524 Presbyopia: Secondary | ICD-10-CM | POA: Diagnosis not present

## 2020-05-13 DIAGNOSIS — E119 Type 2 diabetes mellitus without complications: Secondary | ICD-10-CM | POA: Diagnosis not present

## 2020-05-13 DIAGNOSIS — H5203 Hypermetropia, bilateral: Secondary | ICD-10-CM | POA: Diagnosis not present

## 2020-05-13 DIAGNOSIS — H52223 Regular astigmatism, bilateral: Secondary | ICD-10-CM | POA: Diagnosis not present

## 2020-05-13 DIAGNOSIS — H401133 Primary open-angle glaucoma, bilateral, severe stage: Secondary | ICD-10-CM | POA: Diagnosis not present

## 2020-05-13 LAB — HM DIABETES EYE EXAM

## 2020-05-20 ENCOUNTER — Encounter: Payer: 59 | Admitting: Internal Medicine

## 2020-07-29 ENCOUNTER — Ambulatory Visit (INDEPENDENT_AMBULATORY_CARE_PROVIDER_SITE_OTHER): Payer: 59 | Admitting: Internal Medicine

## 2020-07-29 ENCOUNTER — Other Ambulatory Visit (HOSPITAL_COMMUNITY)
Admission: RE | Admit: 2020-07-29 | Discharge: 2020-07-29 | Disposition: A | Discharge status: Home / Self Care | Payer: 59 | Source: Ambulatory Visit | Attending: Internal Medicine | Admitting: Internal Medicine

## 2020-07-29 ENCOUNTER — Other Ambulatory Visit: Payer: Self-pay

## 2020-07-29 VITALS — BP 112/70 | HR 81 | Temp 97.1°F | Resp 16 | Ht 61.0 in | Wt 174.2 lb

## 2020-07-29 DIAGNOSIS — Z1231 Encounter for screening mammogram for malignant neoplasm of breast: Secondary | ICD-10-CM

## 2020-07-29 DIAGNOSIS — Z124 Encounter for screening for malignant neoplasm of cervix: Secondary | ICD-10-CM | POA: Diagnosis not present

## 2020-07-29 DIAGNOSIS — F439 Reaction to severe stress, unspecified: Secondary | ICD-10-CM

## 2020-07-29 DIAGNOSIS — Z0000 Encounter for general adult medical examination without abnormal findings: Secondary | ICD-10-CM

## 2020-07-29 DIAGNOSIS — I10 Essential (primary) hypertension: Secondary | ICD-10-CM | POA: Diagnosis not present

## 2020-07-29 DIAGNOSIS — E7800 Pure hypercholesterolemia, unspecified: Secondary | ICD-10-CM | POA: Diagnosis not present

## 2020-07-29 DIAGNOSIS — E1165 Type 2 diabetes mellitus with hyperglycemia: Secondary | ICD-10-CM | POA: Diagnosis not present

## 2020-07-29 NOTE — Progress Notes (Addendum)
[name], female   DOB: December 21, 1955, 65 y.o.   MRN: [id number]   Subjective:    [name], female    DOB: 04-21-1955, 65 y.o.   MRN: [id number]  HPI This visit occurred during the SARS-CoV-2 public health emergency.  Safety protocols were in place, including screening questions prior to the visit, additional usage of staff PPE, and extensive cleaning of exam room while observing appropriate contact time as indicated for disinfecting solutions.  Patient here for her physical exam.  She reports increased stress.  Stress at work - staffing issues.  Discussed.  Some increased stress at home, but she feels she is handling this relatively well.  Trying to stay active.  No chest pain or sob reported.  No abdominal pain or bowel change.  Watching her diet.  Outside sugars reviewed - averaging 120s - 140.    Past Medical History:  Diagnosis Date  . Achilles rupture, right   . Diabetes mellitus without complication ([place])   . Environmental allergies   . Glaucoma   . History of chicken pox   . Hypertension    Past Surgical History:  Procedure Laterality Date  . BREAST BIOPSY Right 1999   neg  . COLONOSCOPY WITH PROPOFOL N/A 12/12/2017   Procedure: COLONOSCOPY WITH PROPOFOL;  Surgeon: Manya Silvas, MD;  Location: [facility] ENDOSCOPY;  Service: Endoscopy;  Laterality: N/A;   Family History  Problem Relation Age of Onset  . Heart disease Father   . Hypertension Father   . Diabetes Father   . Diabetes Sister        x2  . Colon polyps Mother   . Breast cancer Neg Hx   . Colon cancer Neg Hx    Social History   Socioeconomic History  . Marital status: Married    Spouse name: Not on file  . Number of children: 3  . Years of education: Not on file  . Highest education level: Not on file  Occupational History    Employer: [employer]  Tobacco Use  . Smoking status: Never Smoker  . Smokeless tobacco: Never Used  Substance and Sexual Activity  .  Alcohol use: No    Alcohol/week: 0.0 standard drinks  . Drug use: No  . Sexual activity: Not on file  Other Topics Concern  . Not on file  Social History Narrative  . Not on file   Social Determinants of Health   Financial Resource Strain: Not on file  Food Insecurity: Not on file  Transportation Needs: Not on file  Physical Activity: Not on file  Stress: Not on file  Social Connections: Not on file    Outpatient Encounter Medications as of 07/29/2020  Medication Sig  . amLODipine (NORVASC) 5 MG tablet TAKE ONE TABLET EVERY DAY  . aspirin EC 81 MG tablet Take 81 mg by mouth daily.  . dorzolamide-timolol (COSOPT) 22.3-6.8 MG/ML ophthalmic solution   . glucose blood (GE100 BLOOD GLUCOSE TEST) test strip Used to test blood sugars daily.  Marland Kitchen lisinopril (ZESTRIL) 10 MG tablet TAKE ONE TABLET EVERY DAY  . metFORMIN (GLUCOPHAGE) 1000 MG tablet TAKE ONE TABLET TWICE DAILY WITH A MEAL  . Multiple Vitamin (MULTIVITAMIN) tablet Take 1 tablet by mouth daily.  Marland Kitchen nystatin cream (MYCOSTATIN) Apply 1 application topically 2 (two) times daily.  . rosuvastatin (CRESTOR) 10 MG tablet TAKE ONE TABLET EVERY DAY  . timolol (BETIMOL) 0.25 % ophthalmic solution 1-2 drops 2 (two) times daily.  Marland Kitchen triamcinolone  cream (KENALOG) 0.1 % APPLY TO THE AFFECTED AREA DAILY AS NEEDED  . triamterene-hydrochlorothiazide (DYAZIDE) 37.5-25 MG capsule TAKE 1 CAPSULE EVERY MORNING  . vitamin E 200 UNIT capsule Take 200 Units by mouth daily.   No facility-administered encounter medications on file as of 07/29/2020.    Review of Systems  Constitutional: Negative for appetite change and unexpected weight change.  HENT: Negative for congestion, sinus pressure and sore throat.   Eyes: Negative for pain and visual disturbance.  Respiratory: Negative for cough, chest tightness and shortness of breath.   Cardiovascular: Negative for chest pain, palpitations and leg swelling.  Gastrointestinal: Negative for abdominal pain,  diarrhea, nausea and vomiting.  Genitourinary: Negative for difficulty urinating and dysuria.  Musculoskeletal: Negative for joint swelling and myalgias.  Skin: Negative for color change and rash.  Neurological: Negative for dizziness, light-headedness and headaches.  Hematological: Negative for adenopathy. Does not bruise/bleed easily.  Psychiatric/Behavioral: Negative for agitation and dysphoric mood.       Objective:    Physical Exam Vitals reviewed.  Constitutional:      General: She is not in acute distress.    Appearance: Normal appearance. She is well-developed.  HENT:     Head: Normocephalic and atraumatic.     Right Ear: External ear normal.     Left Ear: External ear normal.  Eyes:     General: No scleral icterus.       Right eye: No discharge.        Left eye: No discharge.     Conjunctiva/sclera: Conjunctivae normal.  Neck:     Thyroid: No thyromegaly.  Cardiovascular:     Rate and Rhythm: Normal rate and regular rhythm.  Pulmonary:     Effort: No tachypnea, accessory muscle usage or respiratory distress.     Breath sounds: Normal breath sounds. No decreased breath sounds or wheezing.  Chest:  Breasts:     Right: No inverted nipple, mass, nipple discharge or tenderness (no axillary adenopathy).     Left: No inverted nipple, mass, nipple discharge or tenderness (no axilarry adenopathy).    Abdominal:     General: Bowel sounds are normal.     Palpations: Abdomen is soft.     Tenderness: There is no abdominal tenderness.  Genitourinary:    Comments: Normal external genitalia.  Vaginal vault without lesions.  Cervix identified.  Pap smear performed.  Could not appreciate any adnexal masses or tenderness.   Musculoskeletal:        General: No swelling or tenderness.     Cervical back: Neck supple. No tenderness.  Lymphadenopathy:     Cervical: No cervical adenopathy.  Skin:    Findings: No erythema or rash.  Neurological:     Mental Status: She is alert and  oriented to person, place, and time.  Psychiatric:        Mood and Affect: Mood normal.        Behavior: Behavior normal.     BP 112/70   Pulse 81   Temp (!) 97.1 F (36.2 C) (Oral)   Resp 16   Ht 5' 1"  (1.549 m)   Wt 174 lb 3.2 oz (79 kg)   LMP 02/26/2008   SpO2 98%   BMI 32.91 kg/m  Wt Readings from Last 3 Encounters:  07/29/20 174 lb 3.2 oz (79 kg)  01/16/20 176 lb (79.8 kg)  09/13/19 178 lb (80.7 kg)     Lab Results  Component Value Date   WBC 6.1 09/20/2019  HGB 13.5 09/20/2019   HCT 39.5 09/20/2019   PLT 203.0 09/20/2019   GLUCOSE 144 (H) 01/16/2020   CHOL 98 01/16/2020   TRIG 156.0 (H) 01/16/2020   HDL 39.40 01/16/2020   LDLDIRECT 100.0 12/07/2017   LDLCALC 28 01/16/2020   ALT 34 01/16/2020   AST 25 01/16/2020   NA 138 01/16/2020   K 4.1 01/16/2020   CL 103 01/16/2020   CREATININE 0.78 01/16/2020   BUN 18 01/16/2020   CO2 26 01/16/2020   TSH 0.63 09/20/2019   HGBA1C 7.2 (H) 01/16/2020   MICROALBUR 0.8 09/20/2019    MM 3D SCREEN BREAST BILATERAL  Result Date: 12/26/2019 CLINICAL DATA:  Screening. EXAM: DIGITAL SCREENING BILATERAL MAMMOGRAM WITH TOMO AND CAD COMPARISON:  Previous exam(s). ACR Breast Density Category c: The breast tissue is heterogeneously dense, which may obscure small masses. FINDINGS: There are no findings suspicious for malignancy. Images were processed with CAD. IMPRESSION: No mammographic evidence of malignancy. A result letter of this screening mammogram will be mailed directly to the patient. RECOMMENDATION: Screening mammogram in one year. (Code:SM-B-01Y) BI-RADS CATEGORY  1: Negative. Electronically Signed   By: Kristopher Oppenheim M.D.   On: 12/26/2019 10:32       Assessment & Plan:   Problem List Items Addressed This Visit    Diabetes mellitus ([place])    Outside sugars reviewed and as outlined.  Continue low carb diet and exercise given elevated sugars.   Follow met b and a1c.       Relevant Orders   Basic metabolic panel    Hemoglobin A1c   Health care maintenance    Physical today 07/29/20.  Mammogram 12/26/19 - Birads I.  Colonoscopy 12/2017. PAP 07/29/20.        Hypercholesteremia    Continue crestor.  Low cholesterol diet and exercise.  Follow lipid panel and liver function tests.        Relevant Orders   Hepatic function panel   Lipid panel   Hypertension    Continue lisinopril, triam/hctz and amlodipine.  Blood pressure doing well.  Follow pressures.  Follow metabolic panel.       Stress    Increased stress as outlined.  Discussed.  Does not feel needs any further intervention.  Follow.        Other Visit Diagnoses    Routine general medical examination at a health care facility    -  Primary   Cervical cancer screening       Relevant Orders   Cytology - PAP( [place])   Encounter for screening mammogram for malignant neoplasm of breast       Relevant Orders   MM 3D SCREEN BREAST BILATERAL       Einar Pheasant, MD

## 2020-07-29 NOTE — Assessment & Plan Note (Addendum)
Physical today 07/29/20.  Mammogram 12/26/19 - Birads I.  Colonoscopy 12/2017. PAP 07/29/20.

## 2020-07-30 ENCOUNTER — Encounter: Payer: Self-pay | Admitting: Internal Medicine

## 2020-07-30 ENCOUNTER — Telehealth: Payer: Self-pay | Admitting: Internal Medicine

## 2020-07-30 NOTE — Addendum Note (Signed)
Addended by: Alisa Graff on: 07/30/2020 02:33 PM   Modules accepted: Orders

## 2020-07-30 NOTE — Telephone Encounter (Signed)
Pt aware.

## 2020-07-30 NOTE — Assessment & Plan Note (Signed)
Increased stress as outlined.  Discussed.  Does not feel needs any further intervention.  Follow.  

## 2020-07-30 NOTE — Telephone Encounter (Signed)
Changed orders to be drawn at [facility] - please notify pt to go by within on week.

## 2020-07-30 NOTE — Assessment & Plan Note (Addendum)
Outside sugars reviewed and as outlined.  Continue low carb diet and exercise given elevated sugars.   Follow met b and a1c.

## 2020-07-30 NOTE — Telephone Encounter (Signed)
Called to schedule. Pt would like labs to be placed at the hospital

## 2020-07-30 NOTE — Assessment & Plan Note (Signed)
Continue lisinopril, triam/hctz and amlodipine.  Blood pressure doing well.  Follow pressures.  Follow metabolic panel.

## 2020-07-30 NOTE — Assessment & Plan Note (Signed)
Continue crestor.  Low cholesterol diet and exercise. Follow lipid panel and liver function tests.   

## 2020-07-30 NOTE — Telephone Encounter (Signed)
She is currently scheduled for fasting labs 1-2 days before her 11/2020 appt.  She needs fasting labs within the next week.  Thanks.

## 2020-07-31 ENCOUNTER — Other Ambulatory Visit
Admission: RE | Admit: 2020-07-31 | Discharge: 2020-07-31 | Disposition: A | Discharge status: Home / Self Care | Payer: 59 | Source: Ambulatory Visit | Attending: Internal Medicine | Admitting: Internal Medicine

## 2020-07-31 DIAGNOSIS — E7800 Pure hypercholesterolemia, unspecified: Secondary | ICD-10-CM | POA: Diagnosis not present

## 2020-07-31 DIAGNOSIS — E1165 Type 2 diabetes mellitus with hyperglycemia: Secondary | ICD-10-CM | POA: Insufficient documentation

## 2020-07-31 LAB — LIPID PANEL
Cholesterol: 111 mg/dL (ref 0–200)
HDL: 40 mg/dL — ABNORMAL LOW (ref >40)
LDL Cholesterol: 40 mg/dL (ref 0–99)
Total CHOL/HDL Ratio: 2.8 RATIO
Triglycerides: 155 mg/dL — ABNORMAL HIGH (ref <150)
VLDL: 31 mg/dL (ref 0–40)

## 2020-07-31 LAB — CYTOLOGY - PAP
Comment: NEGATIVE
Diagnosis: NEGATIVE
High risk HPV: NEGATIVE

## 2020-07-31 LAB — HEPATIC FUNCTION PANEL
ALT: 34 U/L (ref 0–44)
AST: 30 U/L (ref 15–41)
Albumin: 4 g/dL (ref 3.5–5.0)
Alkaline Phosphatase: 60 U/L (ref 38–126)
Bilirubin, Direct: <0.1 mg/dL (ref 0.0–0.2)
Total Bilirubin: 0.6 mg/dL (ref 0.3–1.2)
Total Protein: 6.8 g/dL (ref 6.5–8.1)

## 2020-07-31 LAB — BASIC METABOLIC PANEL
Anion gap: 11 (ref 5–15)
BUN: 17 mg/dL (ref 8–23)
CO2: 23 mmol/L (ref 22–32)
Calcium: 8.7 mg/dL — ABNORMAL LOW (ref 8.9–10.3)
Chloride: 104 mmol/L (ref 98–111)
Creatinine, Ser: 0.83 mg/dL (ref 0.44–1.00)
GFR, Estimated: >60 mL/min (ref >60)
Glucose, Bld: 156 mg/dL — ABNORMAL HIGH (ref 70–99)
Potassium: 3.9 mmol/L (ref 3.5–5.1)
Sodium: 138 mmol/L (ref 135–145)

## 2020-07-31 LAB — HEMOGLOBIN A1C
Hgb A1c MFr Bld: 6.5 % — ABNORMAL HIGH (ref 4.8–5.6)
Mean Plasma Glucose: 139.85 mg/dL

## 2020-09-17 ENCOUNTER — Other Ambulatory Visit: Payer: Self-pay | Admitting: Internal Medicine

## 2020-10-13 ENCOUNTER — Ambulatory Visit (INDEPENDENT_AMBULATORY_CARE_PROVIDER_SITE_OTHER): Payer: 59 | Admitting: Dermatology

## 2020-10-13 ENCOUNTER — Other Ambulatory Visit: Payer: Self-pay

## 2020-10-13 DIAGNOSIS — L814 Other melanin hyperpigmentation: Secondary | ICD-10-CM | POA: Diagnosis not present

## 2020-10-13 DIAGNOSIS — L570 Actinic keratosis: Secondary | ICD-10-CM

## 2020-10-13 DIAGNOSIS — L739 Follicular disorder, unspecified: Secondary | ICD-10-CM | POA: Diagnosis not present

## 2020-10-13 MED ORDER — KETOCONAZOLE 2 % EX SHAM: MEDICATED_SHAMPOO | CUTANEOUS | 6 refills | Status: DC

## 2020-10-13 MED ORDER — FLUOCINOLONE ACETONIDE SCALP 0.01 % EX OIL: TOPICAL_OIL | CUTANEOUS | 6 refills | Status: DC

## 2020-10-13 MED ORDER — DOXYCYCLINE MONOHYDRATE 50 MG PO TABS: 50.0000 mg | ORAL_TABLET | Freq: Two times a day (BID) | ORAL | 6 refills | Status: DC

## 2020-10-13 NOTE — Patient Instructions (Signed)

## 2020-10-13 NOTE — Progress Notes (Signed)
   Follow-Up Visit   Subjective  April Solis is a 65 y.o. female who presents for the following: prescription refill request (Patient needs refills on Doxycycline 50mg  po QD, Fluocinolone oil, and Ketoconazole 2% shampoo - our office no longer has her records, but patient states that she was seen about two years ago and c/o bumps in the scalp.).  The following portions of the chart were reviewed this encounter and updated as appropriate:       Review of Systems:  No other skin or systemic complaints except as noted in HPI or Assessment and Plan.  Objective  Well appearing patient in no apparent distress; mood and affect are within normal limits.  A focused examination was performed including the scalp. Relevant physical exam findings are noted in the Assessment and Plan.  L upper forehead x 1 Erythematous thin papules/macules with gritty scale.   Assessment & Plan  Folliculitis Scalp  Scalp- controlled Continue Doxycycline 50mg  po QD-BID PRN flares, Fluocinolone oil hs PRN flares, and Ketoconazole 2% shampoo PRN.   Patient to call if she needs refills of topical foam - she will report name and frequency since we no longer have those records.  doxycycline (ADOXA) 50 MG tablet - Scalp Take 1 tablet (50 mg total) by mouth 2 (two) times daily. Take 1-2 tabs po QD PRN flares.  Fluocinolone Acetonide Scalp 0.01 % OIL - Scalp Apply to aa's scalp QD PRN flares.  ketoconazole (NIZORAL) 2 % shampoo - Scalp Shampoo into the scalp let sit 10 minutes then wash out. Use 2-3 days per week.  AK (actinic keratosis) L upper forehead x 1  Destruction of lesion - L upper forehead x 1  Destruction method: cryotherapy   Informed consent: discussed and consent obtained   Lesion destroyed using liquid nitrogen: Yes   Region frozen until ice ball extended beyond lesion: Yes   Outcome: patient tolerated procedure well with no complications   Post-procedure details: wound care  instructions given    Lentigines - Scattered tan macules - Due to sun exposure - Benign-appering, observe - Recommend daily broad spectrum sunscreen SPF 30+ to sun-exposed areas, reapply every 2 hours as needed. - Call for any changes   Return in about 1 year (around 10/13/2021) for prescription refills.  Luther Redo, CMA, am acting as scribe for Brendolyn Patty, MD .  Documentation: I have reviewed the above documentation for accuracy and completeness, and I agree with the above.  Brendolyn Patty MD

## 2020-10-19 ENCOUNTER — Other Ambulatory Visit: Payer: Self-pay | Admitting: Internal Medicine

## 2020-11-14 DIAGNOSIS — H401133 Primary open-angle glaucoma, bilateral, severe stage: Secondary | ICD-10-CM | POA: Diagnosis not present

## 2020-11-27 ENCOUNTER — Telehealth: Payer: Self-pay | Admitting: *Deleted

## 2020-11-27 ENCOUNTER — Other Ambulatory Visit: Payer: Self-pay | Admitting: Internal Medicine

## 2020-11-27 DIAGNOSIS — E1165 Type 2 diabetes mellitus with hyperglycemia: Secondary | ICD-10-CM

## 2020-11-27 DIAGNOSIS — E7800 Pure hypercholesterolemia, unspecified: Secondary | ICD-10-CM

## 2020-11-27 DIAGNOSIS — I10 Essential (primary) hypertension: Secondary | ICD-10-CM

## 2020-11-27 NOTE — Telephone Encounter (Signed)
Please place future orders for lab appt.  

## 2020-11-27 NOTE — Telephone Encounter (Signed)
Orders placed for labs

## 2020-11-28 ENCOUNTER — Other Ambulatory Visit: Payer: Self-pay

## 2020-11-28 ENCOUNTER — Other Ambulatory Visit (INDEPENDENT_AMBULATORY_CARE_PROVIDER_SITE_OTHER): Payer: Medicare Other

## 2020-11-28 DIAGNOSIS — E1165 Type 2 diabetes mellitus with hyperglycemia: Secondary | ICD-10-CM | POA: Diagnosis not present

## 2020-11-28 DIAGNOSIS — E7800 Pure hypercholesterolemia, unspecified: Secondary | ICD-10-CM

## 2020-11-28 LAB — CBC WITH DIFFERENTIAL/PLATELET
Basophils Absolute: 0 10*3/uL (ref 0.0–0.1)
Basophils Relative: 0.6 % (ref 0.0–3.0)
Eosinophils Absolute: 0.2 10*3/uL (ref 0.0–0.7)
Eosinophils Relative: 3.3 % (ref 0.0–5.0)
HCT: 41.2 % (ref 36.0–46.0)
Hemoglobin: 13.8 g/dL (ref 12.0–15.0)
Lymphocytes Relative: 38.3 % (ref 12.0–46.0)
Lymphs Abs: 2.4 10*3/uL (ref 0.7–4.0)
MCHC: 33.6 g/dL (ref 30.0–36.0)
MCV: 91.2 fl (ref 78.0–100.0)
Monocytes Absolute: 0.7 10*3/uL (ref 0.1–1.0)
Monocytes Relative: 11.4 % (ref 3.0–12.0)
Neutro Abs: 3 10*3/uL (ref 1.4–7.7)
Neutrophils Relative %: 46.4 % (ref 43.0–77.0)
Platelets: 218 10*3/uL (ref 150.0–400.0)
RBC: 4.51 Mil/uL (ref 3.87–5.11)
RDW: 13.7 % (ref 11.5–15.5)
WBC: 6.4 10*3/uL (ref 4.0–10.5)

## 2020-11-28 LAB — LIPID PANEL
Cholesterol: 113 mg/dL (ref 0–200)
HDL: 39.7 mg/dL (ref >39.00)
LDL Cholesterol: 37 mg/dL (ref 0–99)
NonHDL: 72.85
Total CHOL/HDL Ratio: 3
Triglycerides: 178 mg/dL — ABNORMAL HIGH (ref 0.0–149.0)
VLDL: 35.6 mg/dL (ref 0.0–40.0)

## 2020-11-28 LAB — BASIC METABOLIC PANEL
BUN: 21 mg/dL (ref 6–23)
CO2: 24 mEq/L (ref 19–32)
Calcium: 9.2 mg/dL (ref 8.4–10.5)
Chloride: 103 mEq/L (ref 96–112)
Creatinine, Ser: 0.89 mg/dL (ref 0.40–1.20)
GFR: 68.19 mL/min (ref >60.00)
Glucose, Bld: 138 mg/dL — ABNORMAL HIGH (ref 70–99)
Potassium: 4.1 mEq/L (ref 3.5–5.1)
Sodium: 138 mEq/L (ref 135–145)

## 2020-11-28 LAB — HEPATIC FUNCTION PANEL
ALT: 26 U/L (ref 0–35)
AST: 21 U/L (ref 0–37)
Albumin: 4.2 g/dL (ref 3.5–5.2)
Alkaline Phosphatase: 65 U/L (ref 39–117)
Bilirubin, Direct: 0.1 mg/dL (ref 0.0–0.3)
Total Bilirubin: 0.6 mg/dL (ref 0.2–1.2)
Total Protein: 7.2 g/dL (ref 6.0–8.3)

## 2020-11-28 LAB — HEMOGLOBIN A1C: Hgb A1c MFr Bld: 6.7 % — ABNORMAL HIGH (ref 4.6–6.5)

## 2020-11-28 LAB — TSH: TSH: 1.04 u[IU]/mL (ref 0.35–5.50)

## 2020-11-28 LAB — MICROALBUMIN / CREATININE URINE RATIO
Creatinine,U: 160.7 mg/dL
Microalb Creat Ratio: 1.2 mg/g (ref 0.0–30.0)
Microalb, Ur: 1.9 mg/dL (ref 0.0–1.9)

## 2020-12-01 ENCOUNTER — Telehealth: Payer: Self-pay

## 2020-12-01 ENCOUNTER — Other Ambulatory Visit: Payer: Self-pay

## 2020-12-01 ENCOUNTER — Ambulatory Visit (INDEPENDENT_AMBULATORY_CARE_PROVIDER_SITE_OTHER): Payer: Medicare Other | Admitting: Internal Medicine

## 2020-12-01 DIAGNOSIS — E1165 Type 2 diabetes mellitus with hyperglycemia: Secondary | ICD-10-CM

## 2020-12-01 DIAGNOSIS — E7800 Pure hypercholesterolemia, unspecified: Secondary | ICD-10-CM

## 2020-12-01 DIAGNOSIS — I499 Cardiac arrhythmia, unspecified: Secondary | ICD-10-CM | POA: Diagnosis not present

## 2020-12-01 DIAGNOSIS — I4891 Unspecified atrial fibrillation: Secondary | ICD-10-CM

## 2020-12-01 DIAGNOSIS — I10 Essential (primary) hypertension: Secondary | ICD-10-CM | POA: Diagnosis not present

## 2020-12-01 DIAGNOSIS — F439 Reaction to severe stress, unspecified: Secondary | ICD-10-CM

## 2020-12-01 MED ORDER — METOPROLOL SUCCINATE ER 25 MG PO TB24: 25.0000 mg | ORAL_TABLET | Freq: Every day | ORAL | 1 refills | Status: DC

## 2020-12-01 MED ORDER — APIXABAN 5 MG PO TABS: 5.0000 mg | ORAL_TABLET | Freq: Two times a day (BID) | ORAL | 2 refills | Status: DC

## 2020-12-01 NOTE — Telephone Encounter (Signed)
Samples of Eliquis 5 mg were given to the patient, quantity 2 boxes, Lot Number [id number]. Exp. 11/02/2022

## 2020-12-01 NOTE — Progress Notes (Signed)
[name], female   DOB: 01/13/1956, 65 y.o.   MRN: [id number]   Subjective:    [name], female    DOB: 1955/07/21, 64 y.o.   MRN: [id number]  This visit occurred during the SARS-CoV-2 public health emergency.  Safety protocols were in place, including screening questions prior to the visit, additional usage of staff PPE, and extensive cleaning of exam room while observing appropriate contact time as indicated for disinfecting solutions.   Patient here for scheduled follow up.  Marland Kitchen   HPI Here to follow up regarding her blood pressure, cholesterol and blood sugar.  Reivewed outside blood sugar checks averaging 130-150.  Discussed diet and exercise.  She has adjusted her diet.  Lost weight.  She is walking.  A1c 6.7.  no chest pain or sob with increased activity or exertion.  She has noticed occasional palpitations.  No syncope or near syncope.  No acid reflux reported.  No abdominal pain or bowel change.  Discussed labs.  Retired from hospital.  Working one day per week at [employer].  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  Handling stress.    Past Medical History:  Diagnosis Date   Achilles rupture, right    Diabetes mellitus without complication ([place])    Environmental allergies    Glaucoma    History of chicken pox    Hypertension    Past Surgical History:  Procedure Laterality Date   BREAST BIOPSY Right 1999   neg   COLONOSCOPY WITH PROPOFOL N/A 12/12/2017   Procedure: COLONOSCOPY WITH PROPOFOL;  Surgeon: Manya Silvas, MD;  Location: [facility] ENDOSCOPY;  Service: Endoscopy;  Laterality: N/A;   Family History  Problem Relation Age of Onset   Heart disease Father    Hypertension Father    Diabetes Father    Diabetes Sister        x2   Colon polyps Mother    Breast cancer Neg Hx    Colon cancer Neg Hx    Social History   Socioeconomic History   Marital status: Married    Spouse name: Not on file   Number of  children: 3   Years of education: Not on file   Highest education level: Not on file  Occupational History    Employer: [employer]  Tobacco Use   Smoking status: Never   Smokeless tobacco: Never  Substance and Sexual Activity   Alcohol use: No    Alcohol/week: 0.0 standard drinks   Drug use: No   Sexual activity: Not on file  Other Topics Concern   Not on file  Social History Narrative   Not on file   Social Determinants of Health   Financial Resource Strain: Not on file  Food Insecurity: Not on file  Transportation Needs: Not on file  Physical Activity: Not on file  Stress: Not on file  Social Connections: Not on file    Review of Systems  Constitutional:  Negative for appetite change and unexpected weight change.  HENT:  Negative for congestion and sinus pressure.   Respiratory:  Negative for cough, chest tightness and shortness of breath.   Cardiovascular:  Positive for palpitations. Negative for chest pain and leg swelling.  Gastrointestinal:  Negative for abdominal pain, diarrhea, nausea and vomiting.  Genitourinary:  Negative for difficulty urinating and dysuria.  Musculoskeletal:  Negative for joint swelling and myalgias.  Skin:  Negative for color change and rash.  Neurological:  Negative for dizziness,  light-headedness and headaches.  Psychiatric/Behavioral:  Negative for agitation and dysphoric mood.       Objective:     BP 124/74   Pulse 78   Temp 97.9 F (36.6 C)   Resp 16   Ht _0  (1.549 m)   Wt 165 lb (74.8 kg)   LMP 02/26/2008   SpO2 98%   BMI 31.18 kg/m  Wt Readings from Last 3 Encounters:  12/01/20 165 lb (74.8 kg)  07/29/20 174 lb 3.2 oz (79 kg)  01/16/20 176 lb (79.8 kg)    Physical Exam Vitals reviewed.  Constitutional:      General: She is not in acute distress.    Appearance: Normal appearance.  HENT:     Head: Normocephalic and atraumatic.     Right Ear: External ear normal.     Left Ear: External ear normal.  Eyes:      General: No scleral icterus.       Right eye: No discharge.        Left eye: No discharge.     Conjunctiva/sclera: Conjunctivae normal.  Neck:     Thyroid: No thyromegaly.  Cardiovascular:     Comments: Irregularly irregular.  Rate 100-114.  Pulmonary:     Effort: No respiratory distress.     Breath sounds: Normal breath sounds. No wheezing.  Abdominal:     General: Bowel sounds are normal.     Palpations: Abdomen is soft.     Tenderness: There is no abdominal tenderness.  Musculoskeletal:        General: No swelling or tenderness.     Cervical back: Neck supple. No tenderness.  Lymphadenopathy:     Cervical: No cervical adenopathy.  Skin:    Findings: No erythema or rash.  Neurological:     Mental Status: She is alert.  Psychiatric:        Mood and Affect: Mood normal.        Behavior: Behavior normal.     Outpatient Encounter Medications as of 12/01/2020  Medication Sig   apixaban (ELIQUIS) 5 MG TABS tablet Take 1 tablet (5 mg total) by mouth 2 (two) times daily.   metoprolol succinate (TOPROL-XL) 25 MG 24 hr tablet Take 1 tablet (25 mg total) by mouth daily.   amLODipine (NORVASC) 5 MG tablet TAKE ONE TABLET EVERY DAY   aspirin EC 81 MG tablet Take 81 mg by mouth daily.   dorzolamide-timolol (COSOPT) 22.3-6.8 MG/ML ophthalmic solution    doxycycline (ADOXA) 50 MG tablet Take 1 tablet (50 mg total) by mouth 2 (two) times daily. Take 1-2 tabs po QD PRN flares.   Fluocinolone Acetonide Scalp 0.01 % OIL Apply to aa's scalp QD PRN flares.   glucose blood (GE100 BLOOD GLUCOSE TEST) test strip Used to test blood sugars daily.   ketoconazole (NIZORAL) 2 % shampoo Shampoo into the scalp let sit 10 minutes then wash out. Use 2-3 days per week.   lisinopril (ZESTRIL) 10 MG tablet TAKE ONE TABLET EVERY DAY   metFORMIN (GLUCOPHAGE) 1000 MG tablet TAKE ONE TABLET TWICE DAILY WITH A MEAL   Multiple Vitamin (MULTIVITAMIN) tablet Take 1 tablet by mouth daily.   nystatin cream  (MYCOSTATIN) Apply 1 application topically 2 (two) times daily.   rosuvastatin (CRESTOR) 10 MG tablet TAKE ONE TABLET EVERY DAY   timolol (BETIMOL) 0.25 % ophthalmic solution 1-2 drops 2 (two) times daily.   triamcinolone cream (KENALOG) 0.1 % APPLY TO THE AFFECTED AREA DAILY AS NEEDED  vitamin E 200 UNIT capsule Take 200 Units by mouth daily.   [DISCONTINUED] triamterene-hydrochlorothiazide (DYAZIDE) 37.5-25 MG capsule TAKE 1 CAPSULE EVERY MORNING   No facility-administered encounter medications on file as of 12/01/2020.     Lab Results  Component Value Date   WBC 6.4 11/28/2020   HGB 13.8 11/28/2020   HCT 41.2 11/28/2020   PLT 218.0 11/28/2020   GLUCOSE 138 (H) 11/28/2020   CHOL 113 11/28/2020   TRIG 178.0 (H) 11/28/2020   HDL 39.70 11/28/2020   LDLDIRECT 100.0 12/07/2017   LDLCALC 37 11/28/2020   ALT 26 11/28/2020   AST 21 11/28/2020   NA 138 11/28/2020   K 4.1 11/28/2020   CL 103 11/28/2020   CREATININE 0.89 11/28/2020   BUN 21 11/28/2020   CO2 24 11/28/2020   TSH 1.04 11/28/2020   HGBA1C 6.7 (H) 11/28/2020   MICROALBUR 1.9 11/28/2020       Assessment & Plan:   Problem List Items Addressed This Visit     Atrial fibrillation ([place])    Noted to have irregular heart beat on exam.  EKG - Afib with ventricular rate 114.  New onset afib.  Discussed with her today.  Given ventricular rate 114 - start toprol XL 34m q day.  Stop triam/hctz.  Follow pressures.  Start eliquis 568mbid.  Discussed the need for f/u with cardiology for further testing and evaluation.        Relevant Medications   metoprolol succinate (TOPROL-XL) 25 MG 24 hr tablet   apixaban (ELIQUIS) 5 MG TABS tablet   Other Relevant Orders   Ambulatory referral to Cardiology   [place]   Low carb diet and exercise.  a1c 6.7.  She has adjusted her diet.  Lost weight.  Continue metformin.  Follow met b and a1c.       Relevant Orders   Hemoglobin A1c   Hypercholesteremia    On crestor.  Low  cholesterol diet and exercise. Follow lipid panel and liver function tests.        Relevant Medications   metoprolol succinate (TOPROL-XL) 25 MG 24 hr tablet   apixaban (ELIQUIS) 5 MG TABS tablet   Other Relevant Orders   Lipid panel   Hepatic function panel   Hypertension    Blood pressure doing well.  Currently on triam/hctz, amlodipine and lisinopril.  Given afib with increased ventricular rate, will start toprol.  Given blood pressure - will stop triam/hctz and start toprol.  Follow pressures.  Follow metabolic panel.       Relevant Medications   metoprolol succinate (TOPROL-XL) 25 MG 24 hr tablet   apixaban (ELIQUIS) 5 MG TABS tablet   Irregular heart beat    Noted on exam - EKG as outlined.        Relevant Orders   EKG 12-Lead (Completed)   Stress    Overall handling stress well.  Follow.       Other Visit Diagnoses     Essential hypertension       Relevant Medications   metoprolol succinate (TOPROL-XL) 25 MG 24 hr tablet   apixaban (ELIQUIS) 5 MG TABS tablet   Other Relevant Orders   Basic metabolic panel       I spent 45 minutes with the patient and more than 50% of the time was spent in consultation regarding the above.  Time spent discussing her current symptoms and labs.  Also discussed further treatment and evaluation.   ChEinar PheasantMD

## 2020-12-01 NOTE — Patient Instructions (Addendum)
Stop triam/hctz  Start toprol XL '25mg'$  - one per day.    Stop aspirin  Start eliquis '5mg'$  - take one tablet 2x/day

## 2020-12-02 ENCOUNTER — Encounter: Payer: Self-pay | Admitting: Internal Medicine

## 2020-12-02 DIAGNOSIS — I4891 Unspecified atrial fibrillation: Secondary | ICD-10-CM | POA: Insufficient documentation

## 2020-12-02 DIAGNOSIS — I480 Paroxysmal atrial fibrillation: Secondary | ICD-10-CM | POA: Insufficient documentation

## 2020-12-02 NOTE — Assessment & Plan Note (Signed)
Overall handling stress well.  Follow.

## 2020-12-02 NOTE — Assessment & Plan Note (Signed)
Noted to have irregular heart beat on exam.  EKG - Afib with ventricular rate 114.  New onset afib.  Discussed with her today.  Given ventricular rate 114 - start toprol XL '25mg'$  q day.  Stop triam/hctz.  Follow pressures.  Start eliquis '5mg'$  bid.  Discussed the need for f/u with cardiology for further testing and evaluation.

## 2020-12-02 NOTE — Assessment & Plan Note (Signed)
Noted on exam - EKG as outlined.

## 2020-12-02 NOTE — Assessment & Plan Note (Signed)
Blood pressure doing well.  Currently on triam/hctz, amlodipine and lisinopril.  Given afib with increased ventricular rate, will start toprol.  Given blood pressure - will stop triam/hctz and start toprol.  Follow pressures.  Follow metabolic panel.

## 2020-12-02 NOTE — Assessment & Plan Note (Signed)
Low carb diet and exercise.  a1c 6.7.  She has adjusted her diet.  Lost weight.  Continue metformin.  Follow met b and a1c.

## 2020-12-02 NOTE — Assessment & Plan Note (Signed)
On crestor.  Low cholesterol diet and exercise.  Follow lipid panel and liver function tests.   

## 2020-12-17 ENCOUNTER — Other Ambulatory Visit: Payer: Self-pay | Admitting: Internal Medicine

## 2020-12-25 ENCOUNTER — Other Ambulatory Visit: Payer: Self-pay

## 2020-12-25 ENCOUNTER — Ambulatory Visit
Admission: RE | Admit: 2020-12-25 | Discharge: 2020-12-25 | Disposition: A | Discharge status: Home / Self Care | Payer: Medicare Other | Source: Ambulatory Visit | Attending: Internal Medicine | Admitting: Internal Medicine

## 2020-12-25 DIAGNOSIS — Z1231 Encounter for screening mammogram for malignant neoplasm of breast: Secondary | ICD-10-CM | POA: Insufficient documentation

## 2021-01-16 ENCOUNTER — Other Ambulatory Visit: Payer: Self-pay | Admitting: Internal Medicine

## 2021-02-05 ENCOUNTER — Encounter: Payer: Self-pay | Admitting: Internal Medicine

## 2021-02-05 ENCOUNTER — Ambulatory Visit: Payer: Medicare Other | Admitting: Internal Medicine

## 2021-02-05 ENCOUNTER — Other Ambulatory Visit: Payer: Self-pay

## 2021-02-05 VITALS — BP 114/68 | HR 74 | Ht 62.0 in | Wt 169.0 lb

## 2021-02-05 DIAGNOSIS — E1169 Type 2 diabetes mellitus with other specified complication: Secondary | ICD-10-CM

## 2021-02-05 DIAGNOSIS — E785 Hyperlipidemia, unspecified: Secondary | ICD-10-CM

## 2021-02-05 DIAGNOSIS — I10 Essential (primary) hypertension: Secondary | ICD-10-CM | POA: Diagnosis not present

## 2021-02-05 DIAGNOSIS — I480 Paroxysmal atrial fibrillation: Secondary | ICD-10-CM | POA: Diagnosis not present

## 2021-02-05 NOTE — Patient Instructions (Signed)
Medication Instructions:   Your physician recommends that you continue on your current medications as directed. Please refer to the Current Medication list given to you today.  *If you need a refill on your cardiac medications before your next appointment, please call your pharmacy*   Lab Work:  None ordered  Testing/Procedures:  Your physician has requested that you have an echocardiogram. Echocardiography is a painless test that uses sound waves to create images of your heart. It provides your doctor with information about the size and shape of your heart and how well your heart's chambers and valves are working. This procedure takes approximately one hour. There are no restrictions for this procedure.   Follow-Up: At [facility], you and your health needs are our priority.  As part of our continuing mission to provide you with exceptional heart care, we have created designated Provider Care Teams.  These Care Teams include your primary Cardiologist (physician) and Advanced Practice Providers (APPs -  Physician Assistants and Nurse Practitioners) who all work together to provide you with the care you need, when you need it.  We recommend signing up for the patient portal called "MyChart".  Sign up information is provided on this After Visit Summary.  MyChart is used to connect with patients for Virtual Visits (Telemedicine).  Patients are able to view lab/test results, encounter notes, upcoming appointments, etc.  Non-urgent messages can be sent to your provider as well.   To learn more about what you can do with MyChart, go to [email or web address].    Your next appointment:   3 month(s)  The format for your next appointment:   In Person  Provider:   You may see Dr. Harrell Gave End or one of the following Advanced Practice Providers on your designated Care Team:   Murray Hodgkins, NP Christell Faith, PA-C Marrianne Mood, PA-C Cadence Altamont, [place]

## 2021-02-05 NOTE — Progress Notes (Signed)
New Outpatient Visit Date: 02/05/2021  Referring Provider: Einar Pheasant, [address] [place],  [place] [address]  Chief Complaint: Atrial fibrillation and family history of cardiac disease  HPI:  April Solis is a 65 y.o. female who is being seen today for the evaluation of atrial fibrillation at the request of Dr. Nicki Solis. She has a history of hypertension, hyperlipidemia, type 2 diabetes mellitus, glaucoma, and environmental allergies.  She was noted to have an irregular heartbeat at her last visit with Dr. Nicki Solis in late August.  EKG at that time demonstrated atrial fibrillation.  She was started on apixaban and metoprolol (in the lieu of triamterene-HCTZ).  Today, April Solis reports that she feels well.  Atrial fibrillation discovered by Dr. Nicki Solis in August was incidental.  April Solis wonders if having not gotten much sleep the night before may have contributed to the atrial fibrillation.  She was feeling more tired than usual that day.  She had been feeling palpitations 6-8 months earlier, though these abated after ceasing caffeine consumption.  She has not had any recent palpitations.  She also denies chest pain, shortness of breath, and lightheadedness.  She is tolerating metoprolol well and actually feels like her energy has improved.  She notes occasional mild swelling in her hands but otherwise no significant edema.  Blood pressure is well controlled at home.  She is tolerating apixaban without bleeding.  She is concerned about risk for heart disease, given that her father had an MI in his 35s.  --------------------------------------------------------------------------------------------------  Cardiovascular History & Procedures: Cardiovascular Problems: Atrial fibrillation  Risk Factors: Hypertension, hyperlipidemia, type 2 diabetes mellitus, obesity, and age greater than 64  Cath/PCI: None  CV Surgery: None  EP Procedures and  Devices: None  Non-Invasive Evaluation(s): None  Recent CV Pertinent Labs: Lab Results  Component Value Date   CHOL 113 11/28/2020   CHOL 150 12/26/2012   HDL 39.70 11/28/2020   HDL 40 12/26/2012   LDLCALC 37 11/28/2020   LDLCALC 84 12/26/2012   LDLDIRECT 100.0 12/07/2017   TRIG 178.0 (H) 11/28/2020   TRIG 132 12/26/2012   CHOLHDL 3 11/28/2020   K 4.1 11/28/2020   K 4.0 03/19/2013   BUN 21 11/28/2020   BUN 16 03/19/2013   CREATININE 0.89 11/28/2020   CREATININE 0.87 03/19/2013    --------------------------------------------------------------------------------------------------  Past Medical History:  Diagnosis Date   Achilles rupture, right    Diabetes mellitus without complication ([place])    Environmental allergies    Glaucoma    History of chicken pox    Hyperlipidemia    Hypertension     Past Surgical History:  Procedure Laterality Date   BREAST BIOPSY Right 1999   neg   COLONOSCOPY WITH PROPOFOL N/A 12/12/2017   Procedure: COLONOSCOPY WITH PROPOFOL;  Surgeon: Manya Silvas, MD;  Location: [facility] ENDOSCOPY;  Service: Endoscopy;  Laterality: N/A;    Current Meds  Medication Sig   amLODipine (NORVASC) 5 MG tablet TAKE ONE TABLET EVERY DAY   aspirin EC 81 MG tablet Take 81 mg by mouth daily.   dorzolamide-timolol (COSOPT) 22.3-6.8 MG/ML ophthalmic solution Place 1 drop into both eyes 2 (two) times daily.   doxycycline (ADOXA) 50 MG tablet Take 1 tablet (50 mg total) by mouth 2 (two) times daily. Take 1-2 tabs po QD PRN flares.   ELIQUIS 5 MG TABS tablet TAKE ONE TABLET BY MOUTH TWICE DAILY   Fluocinolone Acetonide Scalp 0.01 % OIL Apply to aa's scalp QD PRN flares.  glucose blood (GE100 BLOOD GLUCOSE TEST) test strip Used to test blood sugars daily.   ketoconazole (NIZORAL) 2 % shampoo Shampoo into the scalp let sit 10 minutes then wash out. Use 2-3 days per week.   lisinopril (ZESTRIL) 10 MG tablet TAKE 1 TABLET BY MOUTH DAILY   metFORMIN (GLUCOPHAGE) 1000  MG tablet TAKE ONE TABLET TWICE DAILY WITH A MEAL   metoprolol succinate (TOPROL-XL) 25 MG 24 hr tablet Take 1 tablet (25 mg total) by mouth daily.   Multiple Vitamin (MULTIVITAMIN) tablet Take 1 tablet by mouth daily.   nystatin cream (MYCOSTATIN) Apply 1 application topically 2 (two) times daily.   rosuvastatin (CRESTOR) 10 MG tablet TAKE ONE TABLET EVERY DAY   timolol (BETIMOL) 0.25 % ophthalmic solution 1-2 drops 2 (two) times daily.   triamcinolone cream (KENALOG) 0.1 % APPLY TO THE AFFECTED AREA DAILY AS NEEDED   vitamin E 200 UNIT capsule Take 200 Units by mouth daily.    Allergies: Influenza vaccines, Rubbing alcohol [alcohol], and Other  Social History   Tobacco Use   Smoking status: Never   Smokeless tobacco: Never  Vaping Use   Vaping Use: Never used  Substance Use Topics   Alcohol use: No    Alcohol/week: 0.0 standard drinks   Drug use: No    Family History  Problem Relation Age of Onset   Colon polyps Mother    Alzheimer's disease Mother    Heart attack Father 4   Hypertension Father    Diabetes Father    Hypertension Sister    Diabetes Sister        x2   COPD Sister    Diabetes Sister    Hypertension Sister    Glaucoma Sister    Arrhythmia Son        Faster heart beat than normal   Breast cancer Neg Hx    Colon cancer Neg Hx     Review of Systems: A 12-system review of systems was performed and was negative except as noted in the HPI.  --------------------------------------------------------------------------------------------------  Physical Exam: BP 114/68 (BP Location: Left Arm, Patient Position: Sitting, Cuff Size: Large)   Pulse 74   Ht 5\' 2"  (1.575 m)   Wt 169 lb (76.7 kg)   LMP 02/26/2008   SpO2 98%   BMI 30.91 kg/m   General: NAD. HEENT: No conjunctival pallor or scleral icterus. Facemask in place. Neck: Supple without lymphadenopathy, thyromegaly, JVD, or HJR. No carotid bruit. Lungs: Normal work of breathing. Clear to  auscultation bilaterally without wheezes or crackles. Heart: Regular rate and rhythm without murmurs, rubs, or gallops. Non-displaced PMI. Abd: Bowel sounds present. Soft, NT/ND without hepatosplenomegaly Ext: No lower extremity edema. Radial, PT, and DP pulses are 2+ bilaterally Skin: Warm and dry without rash. Neuro: CNIII-XII intact. Strength and fine-touch sensation intact in upper and lower extremities bilaterally. Psych: Normal mood and affect.  EKG: Normal sinus rhythm with possible left atrial enlargement.  Compared with prior tracing from 12/01/2020, atrial fibrillation and nonspecific ST changes are no longer present.  Lab Results  Component Value Date   WBC 6.4 11/28/2020   HGB 13.8 11/28/2020   HCT 41.2 11/28/2020   MCV 91.2 11/28/2020   PLT 218.0 11/28/2020    Lab Results  Component Value Date   NA 138 11/28/2020   K 4.1 11/28/2020   CL 103 11/28/2020   CO2 24 11/28/2020   BUN 21 11/28/2020   CREATININE 0.89 11/28/2020   GLUCOSE 138 (H) 11/28/2020  ALT 26 11/28/2020    Lab Results  Component Value Date   CHOL 113 11/28/2020   HDL 39.70 11/28/2020   LDLCALC 37 11/28/2020   LDLDIRECT 100.0 12/07/2017   TRIG 178.0 (H) 11/28/2020   CHOLHDL 3 11/28/2020   Lab Results  Component Value Date   TSH 1.04 11/28/2020    --------------------------------------------------------------------------------------------------  ASSESSMENT AND PLAN: Paroxysmal atrial fibrillation: Atrial fibrillation was incidentally noted during visit with Dr. Nicki Solis in August.  Ms. Topete reports some palpitation several months ago that resolved with caffeine cessation and was asymptomatic at the time atrial fibrillation was diagnosed.  She is in sinus rhythm today.  She actually feels better with addition of low-dose metoprolol.  She is also tolerating apixaban well.  We will defer medication changes today and continue indefinite anticoagulation in the setting of a CHA2DS2-VASc score of at  least 4.  We will obtain an echocardiogram to exclude structural abnormalities that may underlying her atrial fibrillation.  Based on results, ischemia evaluation (ideally coronary CTA) will need to be considered at follow-up.  Hypertension: Blood pressure well controlled today.  Continue Toprol, amlodipine, and lisinopril.  Hyperlipidemia associated with type 2 diabetes mellitus: Lipids very well controlled on most recent check in August.  Continue rosuvastatin.  Ongoing management of diabetes mellitus per Dr. Nicki Solis.  Follow-up: Return to clinic in 3 months.  Nelva Bush, MD 02/05/2021 9:47 AM

## 2021-02-20 ENCOUNTER — Ambulatory Visit (INDEPENDENT_AMBULATORY_CARE_PROVIDER_SITE_OTHER): Payer: Medicare Other | Admitting: Internal Medicine

## 2021-02-20 ENCOUNTER — Other Ambulatory Visit: Payer: Self-pay

## 2021-02-20 VITALS — BP 110/70 | HR 97 | Temp 97.0°F | Resp 16 | Ht 62.0 in | Wt 170.0 lb

## 2021-02-20 DIAGNOSIS — I480 Paroxysmal atrial fibrillation: Secondary | ICD-10-CM

## 2021-02-20 DIAGNOSIS — F439 Reaction to severe stress, unspecified: Secondary | ICD-10-CM

## 2021-02-20 DIAGNOSIS — E1165 Type 2 diabetes mellitus with hyperglycemia: Secondary | ICD-10-CM | POA: Diagnosis not present

## 2021-02-20 DIAGNOSIS — R195 Other fecal abnormalities: Secondary | ICD-10-CM

## 2021-02-20 DIAGNOSIS — I10 Essential (primary) hypertension: Secondary | ICD-10-CM | POA: Diagnosis not present

## 2021-02-20 DIAGNOSIS — E785 Hyperlipidemia, unspecified: Secondary | ICD-10-CM

## 2021-02-20 DIAGNOSIS — E1169 Type 2 diabetes mellitus with other specified complication: Secondary | ICD-10-CM

## 2021-02-20 MED ORDER — METOPROLOL TARTRATE 25 MG PO TABS: 25.0000 mg | ORAL_TABLET | Freq: Two times a day (BID) | ORAL | 2 refills | Status: DC

## 2021-02-20 MED ORDER — METFORMIN HCL ER 500 MG PO TB24: ORAL_TABLET | ORAL | 3 refills | Status: DC

## 2021-02-20 NOTE — Patient Instructions (Signed)
Stop toprol XL 25mg   Start lopressor (metoprolol) 25mg  and take twice a day.    Stop metformin 1000mg     Start metformin XR 500mg  and take two tablets twice a day.

## 2021-02-20 NOTE — Progress Notes (Signed)
[name], female   DOB: 03-24-1956, 65 y.o.   MRN: [id number]   Subjective:    [name], female    DOB: 1955/07/29, 65 y.o.   MRN: [id number]  This visit occurred during the SARS-CoV-2 public health emergency.  Safety protocols were in place, including screening questions prior to the visit, additional usage of staff PPE, and extensive cleaning of exam room while observing appropriate contact time as indicated for disinfecting solutions.   Patient here for a scheduled follow up   HPI Here to follow up regarding her blood pressure, blood sugar and cholesterol. Last visit was found to be in afib.  She was started on toprol and eliquis.  Saw cardiology. EKG then revealed SR.  She reports she has been doing relatively well.  Did notice this pm, some palpitations.  No chest pain.  No sob.  Cannot feel her heart racing.  No cough or congestion.  No acid reflux reported.  No abdominal pain.  Does report loose stool.  Discussed possible side effect of metformin.  Discussed changing to extended release.  Blood sugars in am 150-160.  Discussed diet and exercise.     Past Medical History:  Diagnosis Date   Achilles rupture, right    Diabetes mellitus without complication ([place])    Environmental allergies    Glaucoma    History of chicken pox    Hyperlipidemia    Hypertension    Past Surgical History:  Procedure Laterality Date   BREAST BIOPSY Right 1999   neg   COLONOSCOPY WITH PROPOFOL N/A 12/12/2017   Procedure: COLONOSCOPY WITH PROPOFOL;  Surgeon: Manya Silvas, MD;  Location: [facility] ENDOSCOPY;  Service: Endoscopy;  Laterality: N/A;   Family History  Problem Relation Age of Onset   Colon polyps Mother    Alzheimer's disease Mother    Heart attack Father 97   Hypertension Father    Diabetes Father    Hypertension Sister    Diabetes Sister        x2   COPD Sister    Diabetes Sister    Hypertension Sister    Glaucoma Sister     Arrhythmia Son        Faster heart beat than normal   Breast cancer Neg Hx    Colon cancer Neg Hx    Social History   Socioeconomic History   Marital status: Married    Spouse name: Not on file   Number of children: 3   Years of education: Not on file   Highest education level: Not on file  Occupational History    Employer: [employer]  Tobacco Use   Smoking status: Never   Smokeless tobacco: Never  Vaping Use   Vaping Use: Never used  Substance and Sexual Activity   Alcohol use: No    Alcohol/week: 0.0 standard drinks   Drug use: No   Sexual activity: Not on file  Other Topics Concern   Not on file  Social History Narrative   Not on file   Social Determinants of Health   Financial Resource Strain: Not on file  Food Insecurity: Not on file  Transportation Needs: Not on file  Physical Activity: Not on file  Stress: Not on file  Social Connections: Not on file     Review of Systems  Constitutional:  Negative for appetite change and unexpected weight change.  HENT:  Negative for congestion and sinus pressure.   Respiratory:  Negative for cough,  chest tightness and shortness of breath.   Cardiovascular:  Negative for chest pain and leg swelling.       Palpitations today as outlined.   Gastrointestinal:  Negative for abdominal pain, nausea and vomiting.       Loose stool as outlined.  May have three loose bowel movements in a day - maybe every third day.   Genitourinary:  Negative for difficulty urinating and dysuria.  Musculoskeletal:  Negative for joint swelling and myalgias.  Skin:  Negative for color change and rash.  Neurological:  Negative for dizziness, light-headedness and headaches.  Psychiatric/Behavioral:  Negative for agitation and dysphoric mood.       Objective:     BP 110/70   Pulse 97   Temp (!) 97 F (36.1 C)   Resp 16   Ht 5' 2"  (1.575 m)   Wt 170 lb (77.1 kg)   LMP 02/26/2008   SpO2 98%   BMI 31.09 kg/m  Wt Readings from Last 3 Encounters:   02/20/21 170 lb (77.1 kg)  02/05/21 169 lb (76.7 kg)  12/01/20 165 lb (74.8 kg)    Physical Exam Vitals reviewed.  Constitutional:      General: She is not in acute distress.    Appearance: Normal appearance.  HENT:     Head: Normocephalic and atraumatic.     Right Ear: External ear normal.     Left Ear: External ear normal.  Eyes:     General: No scleral icterus.       Right eye: No discharge.        Left eye: No discharge.     Conjunctiva/sclera: Conjunctivae normal.  Neck:     Thyroid: No thyromegaly.  Cardiovascular:     Comments: Irregular irregular - rate approximately 120 Pulmonary:     Effort: No respiratory distress.     Breath sounds: Normal breath sounds. No wheezing.  Abdominal:     General: Bowel sounds are normal.     Palpations: Abdomen is soft.     Tenderness: There is no abdominal tenderness.  Musculoskeletal:        General: No swelling or tenderness.     Cervical back: Neck supple. No tenderness.  Lymphadenopathy:     Cervical: No cervical adenopathy.  Skin:    Findings: No erythema or rash.  Neurological:     Mental Status: She is alert.  Psychiatric:        Mood and Affect: Mood normal.        Behavior: Behavior normal.     Outpatient Encounter Medications as of 02/20/2021  Medication Sig   metFORMIN (GLUCOPHAGE-XR) 500 MG 24 hr tablet Two tablets bid   metoprolol tartrate (LOPRESSOR) 25 MG tablet Take 1 tablet (25 mg total) by mouth 2 (two) times daily.   amLODipine (NORVASC) 5 MG tablet TAKE ONE TABLET EVERY DAY   aspirin EC 81 MG tablet Take 81 mg by mouth daily.   dorzolamide-timolol (COSOPT) 22.3-6.8 MG/ML ophthalmic solution Place 1 drop into both eyes 2 (two) times daily.   doxycycline (ADOXA) 50 MG tablet Take 1 tablet (50 mg total) by mouth 2 (two) times daily. Take 1-2 tabs po QD PRN flares.   ELIQUIS 5 MG TABS tablet TAKE ONE TABLET BY MOUTH TWICE DAILY   Fluocinolone Acetonide Scalp 0.01 % OIL Apply to aa's scalp QD PRN flares.    glucose blood (GE100 BLOOD GLUCOSE TEST) test strip Used to test blood sugars daily.   ketoconazole (NIZORAL) 2 %  shampoo Shampoo into the scalp let sit 10 minutes then wash out. Use 2-3 days per week.   lisinopril (ZESTRIL) 10 MG tablet TAKE 1 TABLET BY MOUTH DAILY   Multiple Vitamin (MULTIVITAMIN) tablet Take 1 tablet by mouth daily.   nystatin cream (MYCOSTATIN) Apply 1 application topically 2 (two) times daily.   rosuvastatin (CRESTOR) 10 MG tablet TAKE ONE TABLET EVERY DAY   timolol (BETIMOL) 0.25 % ophthalmic solution 1-2 drops 2 (two) times daily.   triamcinolone cream (KENALOG) 0.1 % APPLY TO THE AFFECTED AREA DAILY AS NEEDED   vitamin E 200 UNIT capsule Take 200 Units by mouth daily.   [DISCONTINUED] metFORMIN (GLUCOPHAGE) 1000 MG tablet TAKE ONE TABLET TWICE DAILY WITH A MEAL   [DISCONTINUED] metoprolol succinate (TOPROL-XL) 25 MG 24 hr tablet Take 1 tablet (25 mg total) by mouth daily.   No facility-administered encounter medications on file as of 02/20/2021.     Lab Results  Component Value Date   WBC 6.4 11/28/2020   HGB 13.8 11/28/2020   HCT 41.2 11/28/2020   PLT 218.0 11/28/2020   GLUCOSE 138 (H) 11/28/2020   CHOL 113 11/28/2020   TRIG 178.0 (H) 11/28/2020   HDL 39.70 11/28/2020   LDLDIRECT 100.0 12/07/2017   LDLCALC 37 11/28/2020   ALT 26 11/28/2020   AST 21 11/28/2020   NA 138 11/28/2020   K 4.1 11/28/2020   CL 103 11/28/2020   CREATININE 0.89 11/28/2020   BUN 21 11/28/2020   CO2 24 11/28/2020   TSH 1.04 11/28/2020   HGBA1C 6.7 (H) 11/28/2020   MICROALBUR 1.9 11/28/2020    MM 3D SCREEN BREAST BILATERAL  Result Date: 01/01/2021 CLINICAL DATA:  Screening. EXAM: DIGITAL SCREENING BILATERAL MAMMOGRAM WITH TOMOSYNTHESIS AND CAD TECHNIQUE: Bilateral screening digital craniocaudal and mediolateral oblique mammograms were obtained. Bilateral screening digital breast tomosynthesis was performed. The images were evaluated with computer-aided detection. COMPARISON:   Previous exam(s). ACR Breast Density Category c: The breast tissue is heterogeneously dense, which may obscure small masses. FINDINGS: There are no findings suspicious for malignancy. IMPRESSION: No mammographic evidence of malignancy. A result letter of this screening mammogram will be mailed directly to the patient. RECOMMENDATION: Screening mammogram in one year. (Code:SM-B-01Y) BI-RADS CATEGORY  1: Negative. Electronically Signed   By: Fidela Salisbury M.D.   On: 01/01/2021 13:09      Assessment & Plan:   Problem List Items Addressed This Visit     Diabetes mellitus ([place])    Low carb diet and exercise. Last a1c 6.7.  Sugar appear to be elevated now as outlined.  Discussed diet and exercise.  Discussed loose stool.  Will change metformin to XR.  Follow met b and a1c. Call with update.        Relevant Medications   metFORMIN (GLUCOPHAGE-XR) 500 MG 24 hr tablet   Essential hypertension    Blood pressure doing well.  Currently on toprol, amlodipine and lisinopril.  Will change toprol to lopressor 6m bid.  Follow pressures.  Follow metabolic panel.       Relevant Medications   metoprolol tartrate (LOPRESSOR) 25 MG tablet   Hyperlipidemia associated with type 2 diabetes mellitus ([place]    On crestor.  Low cholesterol diet and exercise. Follow lipid panel and liver function tests.        Relevant Medications   metoprolol tartrate (LOPRESSOR) 25 MG tablet   metFORMIN (GLUCOPHAGE-XR) 500 MG 24 hr tablet   Loose stools    Loose stools as outlined.  Change  metformin to XR.  Follow.  Notify me if persistent.       PAF (paroxysmal atrial fibrillation) ([place]) - Primary    Diagnosed with afib last visit.  Placed on toprol xl 7m q day and eliquis.  Saw cardiology. Planning for echo 03/12/21.  EKG at cardiology appt - SR (vent rate 70s).  Palpitations this pm.  EKG - Afib with RVR - rate 130.  Was given metoprolol in the office and monitored.  Repeat EKG afib with VR 115.  She denies any chest  pain or sob.  No increased heart rate or palpitations currently.  Continue eliquis.  Discussed with cardiology.  They are going to f/u with her next week.  Change toprol xl to lopressor 239mbid.  Follow closely.  Any change or worsening symptoms, will need to be evaluated.        Relevant Medications   metoprolol tartrate (LOPRESSOR) 25 MG tablet   Other Relevant Orders   EKG 12-Lead (Completed)   Stress    Overall appears to be handling stress relatively well.  Does not feel needs any further intervention.  Follow.        I spent over 45 minutes with the patient and more than 50% of the time was spent in consultation regarding the above.  Time spent discussing her current issues and concerns.  Time also spent discussing treatment changes and plans for further evaluation.   ChEinar PheasantMD

## 2021-02-23 ENCOUNTER — Encounter: Payer: Self-pay | Admitting: Internal Medicine

## 2021-02-23 ENCOUNTER — Telehealth: Payer: Self-pay | Admitting: Internal Medicine

## 2021-02-23 DIAGNOSIS — R195 Other fecal abnormalities: Secondary | ICD-10-CM | POA: Insufficient documentation

## 2021-02-23 NOTE — Telephone Encounter (Signed)
Scheduled

## 2021-02-23 NOTE — Telephone Encounter (Signed)
-----   Message from Lamar Laundry, RN sent at 02/20/2021  5:06 PM EST ----- Regarding: Patient needs an appt in 1-2 weeks with CE or APP Dr. Garen Lah received a DOD call from the patients pcp Dr. Nicki Reaper. today This is a Dr. Saunders Revel pt.  Per Dr. Garen Lah  this patient needs a 1-2 week appt wih Dr. Saunders Revel or an APP for PAF.

## 2021-02-23 NOTE — Assessment & Plan Note (Signed)
Diagnosed with afib last visit.  Placed on toprol xl 25mg  q day and eliquis.  Saw cardiology. Planning for echo 03/12/21.  EKG at cardiology appt - SR (vent rate 70s).  Palpitations this pm.  EKG - Afib with RVR - rate 130.  Was given metoprolol in the office and monitored.  Repeat EKG afib with VR 115.  She denies any chest pain or sob.  No increased heart rate or palpitations currently.  Continue eliquis.  Discussed with cardiology.  They are going to f/u with her next week.  Change toprol xl to lopressor 25mg  bid.  Follow closely.  Any change or worsening symptoms, will need to be evaluated.

## 2021-02-23 NOTE — Assessment & Plan Note (Signed)
Blood pressure doing well.  Currently on toprol, amlodipine and lisinopril.  Will change toprol to lopressor 25mg  bid.  Follow pressures.  Follow metabolic panel.

## 2021-02-23 NOTE — Assessment & Plan Note (Signed)
On crestor.  Low cholesterol diet and exercise.  Follow lipid panel and liver function tests.   

## 2021-02-23 NOTE — Assessment & Plan Note (Signed)
Low carb diet and exercise. Last a1c 6.7.  Sugar appear to be elevated now as outlined.  Discussed diet and exercise.  Discussed loose stool.  Will change metformin to XR.  Follow met b and a1c. Call with update.   

## 2021-02-23 NOTE — Assessment & Plan Note (Signed)
Overall appears to be handling stress relatively well.  Does not feel needs any further intervention.  Follow.  

## 2021-02-23 NOTE — Assessment & Plan Note (Signed)
Loose stools as outlined.  Change metformin to XR.  Follow.  Notify me if persistent.

## 2021-03-05 ENCOUNTER — Ambulatory Visit: Payer: Medicare Other | Admitting: Internal Medicine

## 2021-03-05 ENCOUNTER — Other Ambulatory Visit: Payer: Self-pay

## 2021-03-05 ENCOUNTER — Encounter: Payer: Self-pay | Admitting: Internal Medicine

## 2021-03-05 VITALS — BP 110/70 | HR 58 | Ht 62.0 in | Wt 170.0 lb

## 2021-03-05 DIAGNOSIS — E785 Hyperlipidemia, unspecified: Secondary | ICD-10-CM | POA: Diagnosis not present

## 2021-03-05 DIAGNOSIS — I480 Paroxysmal atrial fibrillation: Secondary | ICD-10-CM | POA: Diagnosis not present

## 2021-03-05 DIAGNOSIS — E1169 Type 2 diabetes mellitus with other specified complication: Secondary | ICD-10-CM

## 2021-03-05 DIAGNOSIS — I10 Essential (primary) hypertension: Secondary | ICD-10-CM

## 2021-03-05 NOTE — Patient Instructions (Addendum)
Medication Instructions:  Your physician recommends that you continue on your current medications as directed. Please refer to the Current Medication list given to you today.  *If you need a refill on your cardiac medications before your next appointment, please call your pharmacy*   Lab Work: None ordered  If you have labs (blood work) drawn today and your tests are completely normal, you will receive your results only by: [place] (if you have MyChart) OR A paper copy in the mail If you have any lab test that is abnormal or we need to change your treatment, we will call you to review the results.   Testing/Procedures: Echo as scheduled on 03/12/21  Follow-Up: At [facility], you and your health needs are our priority.  As part of our continuing mission to provide you with exceptional heart care, we have created designated Provider Care Teams.  These Care Teams include your primary Cardiologist (physician) and Advanced Practice Providers (APPs -  Physician Assistants and Nurse Practitioners) who all work together to provide you with the care you need, when you need it.  We recommend signing up for the patient portal called "MyChart".  Sign up information is provided on this After Visit Summary.  MyChart is used to connect with patients for Virtual Visits (Telemedicine).  Patients are able to view lab/test results, encounter notes, upcoming appointments, etc.  Non-urgent messages can be sent to your provider as well.   To learn more about what you can do with MyChart, go to [email or web address].    Your next appointment:   Keep appointment as already scheduled on 05/20/2021  The format for your next appointment:   In Person  Provider:   You may see Dr. Harrell Gave End or one of the following Advanced Practice Providers on your designated Care Team:   Murray Hodgkins, NP Christell Faith, PA-C Cadence Kathlen Mody, [place]

## 2021-03-05 NOTE — Progress Notes (Signed)
Follow-up Outpatient Visit Date: 03/05/2021  Primary Care Provider: Einar Pheasant, [place] [address] [address]  Chief Complaint: Atrial fibrillation  HPI:  Ms. April Solis is a 65 y.o. female with history of paroxysmal atrial fibrillation, hypertension, hyperlipidemia, type 2 diabetes mellitus, glaucoma, and environmental allergies, who presents for follow-up of atrial fibrillation.  This was incidentally discovered during wellness visit with Dr. Nicki Reaper in August.  She had previously experienced palpitations, though they seem to improve with cessation of caffeine consumption.  She was feeling well at the time of our visit and was noted to be in sinus rhythm at that time.  She was started on metoprolol and apixaban by Dr. Nicki Reaper this summer, which April Solis was tolerating well.  She was referred for echocardiogram, which is scheduled to be done next week.  April Solis presents today for close follow-up after recent visit with Dr. Nicki Reaper (02/20/2021), at which time she was again noted to be in atrial fibrillation with ventricular rates around 130 bpm.  She noted some palpitations at the time but was otherwise asymptomatic.  Dr. Nicki Reaper increased April Solis's metoprolol to 25 mg twice daily.  Today, April Solis reports that she continues to feel well.  She reports only 1 more episode of transient palpitations when she got up quickly to use the bathroom in the morning.  She denies chest pain, shortness of breath, lightheadedness, and edema.  She remains compliant with metoprolol and apixaban without side effects, including bleeding.  --------------------------------------------------------------------------------------------------  Cardiovascular History & Procedures: Cardiovascular Problems: Paroxysmal atrial fibrillation   Risk Factors: Hypertension, hyperlipidemia, type 2 diabetes mellitus, obesity, and age greater than 6   Cath/PCI: None   CV Surgery: None    EP Procedures and Devices: None   Non-Invasive Evaluation(s): None  Recent CV Pertinent Labs: Lab Results  Component Value Date   CHOL 113 11/28/2020   CHOL 150 12/26/2012   HDL 39.70 11/28/2020   HDL 40 12/26/2012   LDLCALC 37 11/28/2020   LDLCALC 84 12/26/2012   LDLDIRECT 100.0 12/07/2017   TRIG 178.0 (H) 11/28/2020   TRIG 132 12/26/2012   CHOLHDL 3 11/28/2020   K 4.1 11/28/2020   K 4.0 03/19/2013   BUN 21 11/28/2020   BUN 16 03/19/2013   CREATININE 0.89 11/28/2020   CREATININE 0.87 03/19/2013    Past medical and surgical history were reviewed and updated in EPIC.  Current Meds  Medication Sig   amLODipine (NORVASC) 5 MG tablet TAKE ONE TABLET EVERY DAY   aspirin EC 81 MG tablet Take 81 mg by mouth daily.   dorzolamide-timolol (COSOPT) 22.3-6.8 MG/ML ophthalmic solution Place 1 drop into both eyes 2 (two) times daily.   doxycycline (ADOXA) 50 MG tablet Take 1 tablet (50 mg total) by mouth 2 (two) times daily. Take 1-2 tabs po QD PRN flares.   ELIQUIS 5 MG TABS tablet TAKE ONE TABLET BY MOUTH TWICE DAILY   Fluocinolone Acetonide Scalp 0.01 % OIL Apply to aa's scalp QD PRN flares.   glucose blood (GE100 BLOOD GLUCOSE TEST) test strip Used to test blood sugars daily.   ketoconazole (NIZORAL) 2 % shampoo Shampoo into the scalp let sit 10 minutes then wash out. Use 2-3 days per week.   lisinopril (ZESTRIL) 10 MG tablet TAKE 1 TABLET BY MOUTH DAILY   metFORMIN (GLUCOPHAGE-XR) 500 MG 24 hr tablet Two tablets bid   metoprolol tartrate (LOPRESSOR) 25 MG tablet Take 1 tablet (25 mg total) by mouth 2 (two) times  daily.   Multiple Vitamin (MULTIVITAMIN) tablet Take 1 tablet by mouth daily.   nystatin cream (MYCOSTATIN) Apply 1 application topically 2 (two) times daily.   rosuvastatin (CRESTOR) 10 MG tablet TAKE ONE TABLET EVERY DAY   timolol (BETIMOL) 0.25 % ophthalmic solution 1-2 drops 2 (two) times daily.   triamcinolone cream (KENALOG) 0.1 % APPLY TO THE AFFECTED AREA DAILY  AS NEEDED   vitamin E 200 UNIT capsule Take 200 Units by mouth daily.    Allergies: Influenza vaccines, Rubbing alcohol [alcohol], and Other  Social History   Tobacco Use   Smoking status: Never   Smokeless tobacco: Never  Vaping Use   Vaping Use: Never used  Substance Use Topics   Alcohol use: No    Alcohol/week: 0.0 standard drinks   Drug use: No    Family History  Problem Relation Age of Onset   Colon polyps Mother    Alzheimer's disease Mother    Heart attack Father 72   Hypertension Father    Diabetes Father    Hypertension Sister    Diabetes Sister        x2   COPD Sister    Diabetes Sister    Hypertension Sister    Glaucoma Sister    Arrhythmia Son        Faster heart beat than normal   Breast cancer Neg Hx    Colon cancer Neg Hx     Review of Systems: A 12-system review of systems was performed and was negative except as noted in the HPI.  --------------------------------------------------------------------------------------------------  Physical Exam: BP 110/70 (BP Location: Left Arm, Patient Position: Sitting, Cuff Size: Large)   Pulse (!) 58   Ht 5\' 2"  (1.575 m)   Wt 170 lb (77.1 kg)   LMP 02/26/2008   SpO2 98%   BMI 31.09 kg/m   General:  NAD. Neck: No JVD or HJR. Lungs: Clear to auscultation bilaterally without wheezes or crackles. Heart: Regular rate and rhythm without murmurs, rubs, or gallops. Abdomen: Soft, nontender, nondistended. Extremities: No lower extremity edema.  EKG: Sinus bradycardia with isolated PAC.  Otherwise, no significant abnormality.  Compared with 02/20/2021, sinus rhythm has replaced atrial fibrillation with rapid ventricular response and PVCs versus aberrancy.  Lab Results  Component Value Date   WBC 6.4 11/28/2020   HGB 13.8 11/28/2020   HCT 41.2 11/28/2020   MCV 91.2 11/28/2020   PLT 218.0 11/28/2020    Lab Results  Component Value Date   NA 138 11/28/2020   K 4.1 11/28/2020   CL 103 11/28/2020   CO2  24 11/28/2020   BUN 21 11/28/2020   CREATININE 0.89 11/28/2020   GLUCOSE 138 (H) 11/28/2020   ALT 26 11/28/2020    Lab Results  Component Value Date   CHOL 113 11/28/2020   HDL 39.70 11/28/2020   LDLCALC 37 11/28/2020   LDLDIRECT 100.0 12/07/2017   TRIG 178.0 (H) 11/28/2020   CHOLHDL 3 11/28/2020    --------------------------------------------------------------------------------------------------  ASSESSMENT AND PLAN: Paroxysmal atrial fibrillation: Other than transient palpitations, April Solis has remained asymptomatic.  She is in sinus rhythm again today with an isolated PAC.  I agree with recent escalation of metoprolol to improve rate control when April Solis is in atrial fibrillation.  I am reluctant to escalate this further given her resting heart rate of 58 bpm today.  April Solis is scheduled for echocardiogram next week to assess for structural abnormalities.  We will plan to continue her current medications including metoprolol  and apixaban.  Given minimal symptoms, I think it is reasonable to defer antiarrhythmic therapy.  Hypertension: Blood pressure well controlled today.  No medication changes.  Hyperlipidemia associated with type 2 diabetes mellitus: Lipids adequately controlled.  Ongoing management of hyperlipidemia and diabetes per Dr. Nicki Reaper.  Follow-up: Return to clinic as previously scheduled in February.  Nelva Bush, MD 03/05/2021 12:16 PM

## 2021-03-12 ENCOUNTER — Other Ambulatory Visit: Payer: Self-pay

## 2021-03-12 ENCOUNTER — Ambulatory Visit (INDEPENDENT_AMBULATORY_CARE_PROVIDER_SITE_OTHER): Payer: Medicare Other

## 2021-03-12 DIAGNOSIS — I480 Paroxysmal atrial fibrillation: Secondary | ICD-10-CM

## 2021-03-12 LAB — ECHOCARDIOGRAM COMPLETE
AR max vel: 2.09 cm2
AV Area VTI: 2.16 cm2
AV Area mean vel: 2.12 cm2
AV Mean grad: 5 mmHg
AV Peak grad: 8.9 mmHg
Ao pk vel: 1.49 m/s
Area-P 1/2: 4.31 cm2
Calc EF: 57.3 %
S' Lateral: 2.9 cm
Single Plane A2C EF: 58.6 %
Single Plane A4C EF: 51.6 %

## 2021-03-21 ENCOUNTER — Other Ambulatory Visit: Payer: Self-pay | Admitting: Internal Medicine

## 2021-04-03 ENCOUNTER — Other Ambulatory Visit: Payer: Self-pay | Admitting: Internal Medicine

## 2021-04-04 ENCOUNTER — Other Ambulatory Visit: Payer: Self-pay | Admitting: Internal Medicine

## 2021-04-17 ENCOUNTER — Other Ambulatory Visit: Payer: Self-pay | Admitting: Internal Medicine

## 2021-05-19 DIAGNOSIS — H401133 Primary open-angle glaucoma, bilateral, severe stage: Secondary | ICD-10-CM | POA: Diagnosis not present

## 2021-05-19 DIAGNOSIS — H5203 Hypermetropia, bilateral: Secondary | ICD-10-CM | POA: Diagnosis not present

## 2021-05-19 DIAGNOSIS — H524 Presbyopia: Secondary | ICD-10-CM | POA: Diagnosis not present

## 2021-05-19 DIAGNOSIS — E119 Type 2 diabetes mellitus without complications: Secondary | ICD-10-CM | POA: Diagnosis not present

## 2021-05-20 ENCOUNTER — Encounter: Payer: Self-pay | Admitting: Internal Medicine

## 2021-05-20 ENCOUNTER — Ambulatory Visit: Payer: Medicare Other | Admitting: Internal Medicine

## 2021-05-20 ENCOUNTER — Other Ambulatory Visit: Payer: Self-pay

## 2021-05-20 ENCOUNTER — Other Ambulatory Visit
Admission: RE | Admit: 2021-05-20 | Discharge: 2021-05-20 | Disposition: A | Discharge status: Home / Self Care | Payer: Medicare Other | Source: Ambulatory Visit | Attending: Internal Medicine | Admitting: Internal Medicine

## 2021-05-20 VITALS — BP 114/70 | HR 59 | Ht 62.0 in | Wt 173.0 lb

## 2021-05-20 DIAGNOSIS — E785 Hyperlipidemia, unspecified: Secondary | ICD-10-CM

## 2021-05-20 DIAGNOSIS — R072 Precordial pain: Secondary | ICD-10-CM | POA: Insufficient documentation

## 2021-05-20 DIAGNOSIS — I480 Paroxysmal atrial fibrillation: Secondary | ICD-10-CM | POA: Diagnosis not present

## 2021-05-20 DIAGNOSIS — Z01812 Encounter for preprocedural laboratory examination: Secondary | ICD-10-CM | POA: Diagnosis not present

## 2021-05-20 DIAGNOSIS — R079 Chest pain, unspecified: Secondary | ICD-10-CM | POA: Insufficient documentation

## 2021-05-20 DIAGNOSIS — E1169 Type 2 diabetes mellitus with other specified complication: Secondary | ICD-10-CM | POA: Diagnosis not present

## 2021-05-20 LAB — BASIC METABOLIC PANEL
Anion gap: 7 (ref 5–15)
BUN: 17 mg/dL (ref 8–23)
CO2: 30 mmol/L (ref 22–32)
Calcium: 9.5 mg/dL (ref 8.9–10.3)
Chloride: 101 mmol/L (ref 98–111)
Creatinine, Ser: 0.83 mg/dL (ref 0.44–1.00)
GFR, Estimated: >60 mL/min (ref >60)
Glucose, Bld: 193 mg/dL — ABNORMAL HIGH (ref 70–99)
Potassium: 4.5 mmol/L (ref 3.5–5.1)
Sodium: 138 mmol/L (ref 135–145)

## 2021-05-20 NOTE — Patient Instructions (Signed)
Medication Instructions:   Your physician recommends that you continue on your current medications as directed. Please refer to the Current  Medication list given to you today.  *If you need a refill on your cardiac medications before your next appointment, please call your pharmacy*   Lab Work:  Today at the medical mall at [facility]  If you have labs (blood work) drawn today and your tests are completely normal, you will receive your results only by: [place] (if you have MyChart) OR A paper copy in the mail If you have any lab test that is abnormal or we need to change your treatment, we will call you to review the results.   Testing/Procedures:  Your cardiac CT is scheduled Thursday 05/28/21 at 11:00 AM at the below location:  [facility] [place], [place] [address] [phone]  Please arrive 15 mins early for check-in and test prep.  Please follow these instructions carefully (unless otherwise directed):   On the Night Before the Test: Be sure to Drink plenty of water. Do not consume any caffeinated/decaffeinated beverages or chocolate 12 hours prior to your test. Do not take any antihistamines 12 hours prior to your test.   On the Day of the Test:  Drink plenty of water until 1 hour prior to the test. Do not eat any food 4 hours prior to the test. You may take your regular medications prior to the test. (HOLD Diabetic medication morning of test- Metformin) HOLD Furosemide/Hydrochlorothiazide morning of the test. FEMALES- please wear underwire-free bra if available, avoid dresses & tight clothing       After the Test: Drink plenty of water. After receiving IV contrast, you may experience a mild flushed feeling. This is normal. On occasion, you may experience a mild rash up to 24 hours after the test. This is not dangerous. If this occurs, you can take Benadryl 25 mg and increase your fluid  intake. If you experience trouble breathing, this can be serious. If it is severe call 911 IMMEDIATELY. If it is mild, please call our office. If you take any of these medications: Glipizide/Metformin, Avandament, Glucavance, please do not take 48 hours after completing test unless otherwise instructed.  We will call to schedule your test 2-4 weeks out understanding that some insurance companies will need an authorization prior to the service being performed.   For non-scheduling related questions, please contact the cardiac imaging nurse navigator should you have any questions/concerns: Marchia Bond, Cardiac Imaging Nurse Navigator Gordy Clement, Cardiac Imaging Nurse Navigator [facility] Direct Office Dial: [phone]   For scheduling needs, including cancellations and rescheduling, please call [place], [phone].    Follow-Up: At [facility], you and your health needs are our priority.  As part of our continuing mission to provide you with exceptional heart care, we have created designated Provider Care Teams.  These Care Teams include your primary Cardiologist (physician) and Advanced Practice Providers (APPs -  Physician Assistants and Nurse Practitioners) who all work together to provide you with the care you need, when you need it.  We recommend signing up for the patient portal called "MyChart".  Sign up information is provided on this After Visit Summary.  MyChart is used to connect with patients for Virtual Visits (Telemedicine).  Patients are able to view lab/test results, encounter notes, upcoming appointments, etc.  Non-urgent messages can be sent to your provider as well.   To learn  more about what you can do with MyChart, go to [email or web address].    Your next appointment:   6 month(s)  The format for your next appointment:   In Person  Provider:   You may see Dr. Harrell Gave End or one of the following Advanced Practice Providers  on your designated Care Team:   Murray Hodgkins, NP Christell Faith, PA-C Cadence Kathlen Mody, [place]

## 2021-05-20 NOTE — Progress Notes (Signed)
Follow-up Outpatient Visit Date: 05/20/2021  Primary Care Provider: Einar Pheasant, [place] [address] [address]  Chief Complaint: Chest pain  HPI:  April Solis is a 66 y.o. female with history of paroxysmal atrial fibrillation, hypertension, hyperlipidemia, type 2 diabetes mellitus, glaucoma, and environmental allergies, who presents for follow-up of atrial fibrillation.  I last saw her in early December, at which time she was feeling well.  She had been noted to be in atrial fibrillation with rapid ventricular response again at preceding visit with Dr. Nicki Reaper, prompting escalation of metoprolol.  EKG during our visit showed sinus bradycardia with isolated PACs.  We did not make any medication changes.  Subsequent echo showed normal LVEF and diastolic parameters.  No significant valvular abnormality was identified.  Today, April Solis reports a single episode of chest pain a few weeks ago.  She was with her husband in [place] at a urology appointment, when she began having left-sided chest pain that radiated to the center of her chest.  She describes it as tightness.  It lasted about 10 minutes, resolving when she got up and began to walk after the appointment concluded.  She wonders if stress may have caused the episode.  There were no associated symptoms.  She denies shortness of breath, palpitations, and lightheadedness.  She notes stable intermittent dependent edema.  She remains compliant with her medications and has not had any significant bleeding.  --------------------------------------------------------------------------------------------------  Cardiovascular History & Procedures: Cardiovascular Problems: Paroxysmal atrial fibrillation Chest pain   Risk Factors: Hypertension, hyperlipidemia, type 2 diabetes mellitus, obesity, and age greater than 33   Cath/PCI: None   CV Surgery: None   EP Procedures and Devices: None   Non-Invasive  Evaluation(s): TTE (03/12/2021): Normal LV size and wall thickness.  LVEF 60-65% with normal wall motion and diastolic function.  GLS -20.5%.  Normal RV size and function.  Normal biatrial size.  Mild mitral valve thickening and mild mitral regurgitation.  Normal CVP.  Recent CV Pertinent Labs: Lab Results  Component Value Date   CHOL 113 11/28/2020   CHOL 150 12/26/2012   HDL 39.70 11/28/2020   HDL 40 12/26/2012   LDLCALC 37 11/28/2020   LDLCALC 84 12/26/2012   LDLDIRECT 100.0 12/07/2017   TRIG 178.0 (H) 11/28/2020   TRIG 132 12/26/2012   CHOLHDL 3 11/28/2020   K 4.1 11/28/2020   K 4.0 03/19/2013   BUN 21 11/28/2020   BUN 16 03/19/2013   CREATININE 0.89 11/28/2020   CREATININE 0.87 03/19/2013    Past medical and surgical history were reviewed and updated in EPIC.  Current Meds  Medication Sig   amLODipine (NORVASC) 5 MG tablet TAKE ONE TABLET EVERY DAY   dorzolamide-timolol (COSOPT) 22.3-6.8 MG/ML ophthalmic solution Place 1 drop into both eyes 2 (two) times daily.   doxycycline (ADOXA) 50 MG tablet Take 1-2 tabs PO QD PRN flares   ELIQUIS 5 MG TABS tablet TAKE ONE TABLET BY MOUTH TWICE DAILY   Fluocinolone Acetonide Scalp 0.01 % OIL Apply to aa's scalp QD PRN flares.   GE100 BLOOD GLUCOSE TEST test strip TEST TWICE DAILY   ketoconazole (NIZORAL) 2 % shampoo Shampoo into the scalp let sit 10 minutes then wash out. Use 2-3 days per week.   lisinopril (ZESTRIL) 10 MG tablet TAKE 1 TABLET BY MOUTH DAILY   metFORMIN (GLUCOPHAGE-XR) 500 MG 24 hr tablet TAKE 2 TABLETS BY MOUTH TWICE DAILY.   metoprolol tartrate (LOPRESSOR) 25 MG tablet TAKE 1  TABLET BY MOUTH 2 TIMES DAILY.   Multiple Vitamin (MULTIVITAMIN) tablet Take 1 tablet by mouth daily.   nystatin cream (MYCOSTATIN) Apply 1 application topically 2 (two) times daily.   rosuvastatin (CRESTOR) 10 MG tablet TAKE ONE TABLET EVERY DAY   timolol (BETIMOL) 0.25 % ophthalmic solution 1-2 drops 2 (two) times daily.   triamcinolone  cream (KENALOG) 0.1 % APPLY TO THE AFFECTED AREA DAILY AS NEEDED   vitamin E 200 UNIT capsule Take 200 Units by mouth daily.    Allergies: Influenza vaccines, Rubbing alcohol [alcohol], and Other  Social History   Tobacco Use   Smoking status: Never   Smokeless tobacco: Never  Vaping Use   Vaping Use: Never used  Substance Use Topics   Alcohol use: No    Alcohol/week: 0.0 standard drinks   Drug use: No    Family History  Problem Relation Age of Onset   Colon polyps Mother    Alzheimer's disease Mother    Heart attack Father 85   Hypertension Father    Diabetes Father    Hypertension Sister    Diabetes Sister        x2   COPD Sister    Diabetes Sister    Hypertension Sister    Glaucoma Sister    Arrhythmia Son        Faster heart beat than normal   Breast cancer Neg Hx    Colon cancer Neg Hx     Review of Systems: A 12-system review of systems was performed and was negative except as noted in the HPI.  --------------------------------------------------------------------------------------------------  Physical Exam: BP 114/70 (BP Location: Left Arm, Patient Position: Sitting, Cuff Size: Large)    Pulse (!) 59    Ht 5\' 2"  (1.575 m)    Wt 173 lb (78.5 kg)    LMP 02/26/2008    SpO2 98%    BMI 31.64 kg/m   General:  NAD. Neck: No JVD or HJR. Lungs: Clear to auscultation bilaterally without wheezes or crackles. Heart: Bradycardic but regular without murmurs, rubs, or gallops. Abdomen: Soft, nontender, nondistended. Extremities: No lower extremity edema.  EKG:  Sinus bradycardia with possible left atrial enlargement.  Otherwise, no significant abnormality.  Lab Results  Component Value Date   WBC 6.4 11/28/2020   HGB 13.8 11/28/2020   HCT 41.2 11/28/2020   MCV 91.2 11/28/2020   PLT 218.0 11/28/2020    Lab Results  Component Value Date   NA 138 11/28/2020   K 4.1 11/28/2020   CL 103 11/28/2020   CO2 24 11/28/2020   BUN 21 11/28/2020   CREATININE 0.89  11/28/2020   GLUCOSE 138 (H) 11/28/2020   ALT 26 11/28/2020    Lab Results  Component Value Date   CHOL 113 11/28/2020   HDL 39.70 11/28/2020   LDLCALC 37 11/28/2020   LDLDIRECT 100.0 12/07/2017   TRIG 178.0 (H) 11/28/2020   CHOLHDL 3 11/28/2020    --------------------------------------------------------------------------------------------------  ASSESSMENT AND PLAN: Precordial pain: Episode of chest pain in the setting of a stressful situation could have been due to anxiety.  However, stress could also have unmasked underlying CAD, as April Solis has multiple cardiac risk factors.  She has not had any further symptoms.  Her EKG and exam today are unremarkable.  We have agreed to obtain a coronary CTA for further evaluation.  We will continue current doses of metoprolol and apixaban (in lieu of aspirin given PAF).  Paroxysmal atrial fibrillation: No palpitations reported.  EKG today  again shows sinus rhythm.  We will continue current regimen of metoprolol and apixaban.  Hyperlipidemia associated with type 2 diabetes mellitus: Continue rosuvastatin 10 mg daily; LDL well-controlled on last check in 11/2020.  If significant ASCVD is seen on CTA, addition of Vascepa would need to be considered if TG's remain > 150.  Ongoing management of DM per Dr. Nicki Reaper.  Follow-up: Return to clinic in 6 months.  Nelva Bush, MD 05/20/2021 10:04 AM

## 2021-05-27 ENCOUNTER — Telehealth (HOSPITAL_COMMUNITY): Payer: Self-pay | Admitting: *Deleted

## 2021-05-27 NOTE — Telephone Encounter (Signed)
Attempted to call patient regarding upcoming cardiac CT appointment. °Left message on voicemail with name and callback number ° °Falana Clagg RN Navigator Cardiac Imaging °[facility] °[phone] Office °[phone] Cell ° °

## 2021-05-28 ENCOUNTER — Ambulatory Visit
Admission: RE | Admit: 2021-05-28 | Discharge: 2021-05-28 | Disposition: A | Discharge status: Home / Self Care | Payer: Medicare Other | Source: Ambulatory Visit | Attending: Internal Medicine | Admitting: Internal Medicine

## 2021-05-28 ENCOUNTER — Other Ambulatory Visit: Payer: Self-pay

## 2021-05-28 DIAGNOSIS — R072 Precordial pain: Secondary | ICD-10-CM | POA: Insufficient documentation

## 2021-05-28 MED ORDER — NITROGLYCERIN 0.4 MG SL SUBL
0.4000 mg | SUBLINGUAL_TABLET | Freq: Once | SUBLINGUAL | Status: AC
  Administered 2021-05-28: 0.4 mg via SUBLINGUAL

## 2021-05-28 MED ORDER — IOHEXOL 350 MG/ML SOLN
80.0000 mL | Freq: Once | INTRAVENOUS | Status: AC | PRN
  Administered 2021-05-28: 75 mL via INTRAVENOUS

## 2021-05-28 MED ORDER — METOPROLOL TARTRATE 100 MG PO TABS
100.0000 mg | ORAL_TABLET | Freq: Once | ORAL | Status: AC
  Administered 2021-05-28: 100 mg via ORAL

## 2021-05-28 NOTE — Progress Notes (Signed)
Patient tolerated CT well. Gave a bottle of water to pateint to drink. Vital signs stable encourage to drink water throughout day.Reasons explained and verbalized understanding. Ambulated steady gait.

## 2021-06-15 ENCOUNTER — Other Ambulatory Visit: Payer: Self-pay | Admitting: Internal Medicine

## 2021-07-07 ENCOUNTER — Telehealth: Payer: Self-pay

## 2021-07-07 NOTE — Telephone Encounter (Signed)
LM for pre-visit screening ? ?

## 2021-07-08 ENCOUNTER — Encounter: Payer: Self-pay | Admitting: Internal Medicine

## 2021-07-08 ENCOUNTER — Ambulatory Visit (INDEPENDENT_AMBULATORY_CARE_PROVIDER_SITE_OTHER): Payer: Medicare Other | Admitting: Internal Medicine

## 2021-07-08 DIAGNOSIS — F439 Reaction to severe stress, unspecified: Secondary | ICD-10-CM

## 2021-07-08 DIAGNOSIS — I480 Paroxysmal atrial fibrillation: Secondary | ICD-10-CM

## 2021-07-08 DIAGNOSIS — E1169 Type 2 diabetes mellitus with other specified complication: Secondary | ICD-10-CM | POA: Diagnosis not present

## 2021-07-08 DIAGNOSIS — E1165 Type 2 diabetes mellitus with hyperglycemia: Secondary | ICD-10-CM

## 2021-07-08 DIAGNOSIS — E785 Hyperlipidemia, unspecified: Secondary | ICD-10-CM

## 2021-07-08 DIAGNOSIS — I10 Essential (primary) hypertension: Secondary | ICD-10-CM | POA: Diagnosis not present

## 2021-07-08 LAB — HM DIABETES FOOT EXAM

## 2021-07-08 MED ORDER — EMPAGLIFLOZIN 10 MG PO TABS: 10.0000 mg | ORAL_TABLET | Freq: Every day | ORAL | 2 refills | Status: DC

## 2021-07-08 NOTE — Progress Notes (Signed)
[name], female   DOB: [birth year], 66 y.o.   MRN: [id number] ? ? ?Subjective:  ? ? [name], female    DOB: December 24, 1955, 66 y.o.   MRN: [id number] ? ?This visit occurred during the SARS-CoV-2 public health emergency.  Safety protocols were in place, including screening questions prior to the visit, additional usage of staff PPE, and extensive cleaning of exam room while observing appropriate contact time as indicated for disinfecting solutions.  ? ?Patient here for a scheduled follow up.  ? ?Chief Complaint  ?Patient presents with  ? Hyperlipidemia  ? Hypertension  ? Atrial Fibrillation  ? .  ? ?HPI ?Recently saw cardiology 05/20/21 - f/u afib.  Described an episode of chest pain.  EKG - unremarkable.  Coronary CTA - ok/normal. No chest pain or sob reported.  No abdominal pain.  Bowels better since changing to XR metformin.  Sugars increasing.  Reviewed outside sugars. She has gained weight.  Not exercising.  Discussed diet and exercise.  Denies any increased heart rate or palpitations.  No cough or congestion.   ? ? ?Past Medical History:  ?Diagnosis Date  ? Achilles rupture, right   ? Diabetes mellitus without complication ([place])   ? Environmental allergies   ? Glaucoma   ? History of chicken pox   ? Hyperlipidemia   ? Hypertension   ? ?Past Surgical History:  ?Procedure Laterality Date  ? BREAST BIOPSY Right 1999  ? neg  ? COLONOSCOPY WITH PROPOFOL N/A 12/12/2017  ? Procedure: COLONOSCOPY WITH PROPOFOL;  Surgeon: Manya Silvas, MD;  Location: [facility] ENDOSCOPY;  Service: Endoscopy;  Laterality: N/A;  ? ?Family History  ?Problem Relation Age of Onset  ? Colon polyps Mother   ? Alzheimer's disease Mother   ? Heart attack Father 71  ? Hypertension Father   ? Diabetes Father   ? Hypertension Sister   ? Diabetes Sister   ?     x2  ? COPD Sister   ? Diabetes Sister   ? Hypertension Sister   ? Glaucoma Sister   ? Arrhythmia Son   ?     Faster heart beat than normal  ?  Breast cancer Neg Hx   ? Colon cancer Neg Hx   ? ?Social History  ? ?Socioeconomic History  ? Marital status: Married  ?  Spouse name: Not on file  ? Number of children: 3  ? Years of education: Not on file  ? Highest education level: Not on file  ?Occupational History  ?  Employer: [employer]  ?Tobacco Use  ? Smoking status: Never  ? Smokeless tobacco: Never  ?Vaping Use  ? Vaping Use: Never used  ?Substance and Sexual Activity  ? Alcohol use: No  ?  Alcohol/week: 0.0 standard drinks  ? Drug use: No  ? Sexual activity: Not on file  ?Other Topics Concern  ? Not on file  ?Social History Narrative  ? Not on file  ? ?Social Determinants of Health  ? ?Financial Resource Strain: Not on file  ?Food Insecurity: Not on file  ?Transportation Needs: Not on file  ?Physical Activity: Not on file  ?Stress: Not on file  ?Social Connections: Not on file  ? ? ? ?Review of Systems  ?Constitutional:  Negative for appetite change and unexpected weight change.  ?HENT:  Negative for congestion and sinus pressure.   ?Respiratory:  Negative for cough, chest tightness and shortness of breath.   ?Cardiovascular:  Negative for chest  pain, palpitations and leg swelling.  ?Gastrointestinal:  Negative for abdominal pain, diarrhea, nausea and vomiting.  ?Genitourinary:  Negative for difficulty urinating and dysuria.  ?Musculoskeletal:  Negative for joint swelling and myalgias.  ?Skin:  Negative for color change and rash.  ?Neurological:  Negative for dizziness, light-headedness and headaches.  ?Psychiatric/Behavioral:  Negative for agitation and dysphoric mood.   ? ?   ?Objective:  ?  ? ?BP 128/70   Pulse 85   Temp 97.9 ?F (36.6 ?C)   Resp 16   Ht '5\' 2"'$  (1.575 m)   Wt 171 lb 3.2 oz (77.7 kg)   LMP 02/26/2008   SpO2 98%   BMI 31.31 kg/m?  ?Wt Readings from Last 3 Encounters:  ?07/08/21 171 lb 3.2 oz (77.7 kg)  ?05/20/21 173 lb (78.5 kg)  ?03/05/21 170 lb (77.1 kg)  ? ? ?Physical Exam ?Vitals reviewed.  ?Constitutional:   ?   General: She is not  in acute distress. ?   Appearance: Normal appearance.  ?HENT:  ?   Head: Normocephalic and atraumatic.  ?   Right Ear: External ear normal.  ?   Left Ear: External ear normal.  ?Eyes:  ?   General: No scleral icterus.    ?   Right eye: No discharge.     ?   Left eye: No discharge.  ?   Conjunctiva/sclera: Conjunctivae normal.  ?Neck:  ?   Thyroid: No thyromegaly.  ?Cardiovascular:  ?   Comments: Irregularly irregular - rate approximately 100.  ?Pulmonary:  ?   Effort: No respiratory distress.  ?   Breath sounds: Normal breath sounds. No wheezing.  ?Abdominal:  ?   General: Bowel sounds are normal.  ?   Palpations: Abdomen is soft.  ?   Tenderness: There is no abdominal tenderness.  ?Musculoskeletal:     ?   General: No swelling or tenderness.  ?   Cervical back: Neck supple. No tenderness.  ?Lymphadenopathy:  ?   Cervical: No cervical adenopathy.  ?Skin: ?   Findings: No erythema or rash.  ?Neurological:  ?   Mental Status: She is alert.  ?Psychiatric:     ?   Mood and Affect: Mood normal.     ?   Behavior: Behavior normal.  ? ? ? ?Outpatient Encounter Medications as of 07/08/2021  ?Medication Sig  ? empagliflozin (JARDIANCE) 10 MG TABS tablet Take 1 tablet (10 mg total) by mouth daily before breakfast.  ? amLODipine (NORVASC) 5 MG tablet TAKE ONE TABLET EVERY DAY  ? dorzolamide-timolol (COSOPT) 22.3-6.8 MG/ML ophthalmic solution Place 1 drop into both eyes 2 (two) times daily.  ? doxycycline (ADOXA) 50 MG tablet Take 1-2 tabs PO QD PRN flares  ? ELIQUIS 5 MG TABS tablet TAKE ONE TABLET BY MOUTH TWICE DAILY  ? Fluocinolone Acetonide Scalp 0.01 % OIL Apply to aa's scalp QD PRN flares.  ? GE100 BLOOD GLUCOSE TEST test strip TEST TWICE DAILY  ? ketoconazole (NIZORAL) 2 % shampoo Shampoo into the scalp let sit 10 minutes then wash out. Use 2-3 days per week.  ? lisinopril (ZESTRIL) 10 MG tablet TAKE 1 TABLET BY MOUTH DAILY  ? metFORMIN (GLUCOPHAGE-XR) 500 MG 24 hr tablet TAKE 2 TABLETS BY MOUTH TWICE DAILY.  ? metoprolol  tartrate (LOPRESSOR) 25 MG tablet TAKE 1 TABLET BY MOUTH 2 TIMES DAILY.  ? Multiple Vitamin (MULTIVITAMIN) tablet Take 1 tablet by mouth daily.  ? nystatin cream (MYCOSTATIN) Apply 1 application topically 2 (two) times daily.  ?  rosuvastatin (CRESTOR) 10 MG tablet TAKE ONE TABLET EVERY DAY  ? timolol (BETIMOL) 0.25 % ophthalmic solution 1-2 drops 2 (two) times daily.  ? triamcinolone cream (KENALOG) 0.1 % APPLY TO THE AFFECTED AREA DAILY AS NEEDED  ? vitamin E 200 UNIT capsule Take 200 Units by mouth daily.  ? ?No facility-administered encounter medications on file as of 07/08/2021.  ?  ? ?Lab Results  ?Component Value Date  ? WBC 6.4 11/28/2020  ? HGB 13.8 11/28/2020  ? HCT 41.2 11/28/2020  ? PLT 218.0 11/28/2020  ? GLUCOSE 193 (H) 05/20/2021  ? CHOL 113 11/28/2020  ? TRIG 178.0 (H) 11/28/2020  ? HDL 39.70 11/28/2020  ? LDLDIRECT 100.0 12/07/2017  ? [place] 37 11/28/2020  ? ALT 26 11/28/2020  ? AST 21 11/28/2020  ? NA 138 05/20/2021  ? K 4.5 05/20/2021  ? CL 101 05/20/2021  ? CREATININE 0.83 05/20/2021  ? BUN 17 05/20/2021  ? CO2 30 05/20/2021  ? TSH 1.04 11/28/2020  ? HGBA1C 6.7 (H) 11/28/2020  ? MICROALBUR 1.9 11/28/2020  ? ? ?CT CORONARY MORPH W/CTA COR W/SCORE W/CA W/CM &/OR WO/CM ? ?Addendum Date: 05/28/2021   ?ADDENDUM REPORT: 05/28/2021 16:53 EXAM: OVER-READ INTERPRETATION  CT CHEST The following report is an over-read performed by radiologist Dr. Barnetta Hammersmith [facility] on 05/28/2021. This over-read does not include interpretation of cardiac or coronary anatomy or pathology. The coronary CTA interpretation by the cardiologist is attached. COMPARISON:  None. FINDINGS: No significant noncardiac vascular findings. Visualized mediastinum and hilar regions demonstrate no lymphadenopathy or masses. Visualized lungs show no evidence of pulmonary edema, consolidation, pneumothorax, nodule or pleural fluid. Visualized upper abdomen and bony structures are unremarkable. IMPRESSION: No significant  incidental findings. Electronically Signed   By: Aletta Edouard M.D.   On: 05/28/2021 16:53  ? ?Result Date: 05/28/2021 ?CLINICAL DATA:  Chest pain EXAM: Cardiac/Coronary  CTA TECHNIQUE: The patient was scanned on a Si

## 2021-07-12 ENCOUNTER — Encounter: Payer: Self-pay | Admitting: Internal Medicine

## 2021-07-12 NOTE — Assessment & Plan Note (Signed)
Remains in afib.  On eliquis.  Continue.  Continue beta blocker.  Consider increase to 50 bid.  Denies chest pain, sob, increased heart rate or palpitations.  Continue f/u with cardiology. Recent ECHO - mild MR.  ?

## 2021-07-12 NOTE — Assessment & Plan Note (Signed)
Overall appears to be handling stress relatively well.  Does not feel needs any further intervention.  Follow.  

## 2021-07-12 NOTE — Assessment & Plan Note (Addendum)
Blood pressure as outlined.  Currently on lopressor, amlodipine and lisinopril.  Consider increase lopressor 50mg bid - given increased HR.  Follow pressures.  Follow metabolic panel.  

## 2021-07-12 NOTE — Assessment & Plan Note (Signed)
Low carb diet and exercise. Last a1c 6.7.  Sugar appear to be elevated now as outlined.  Discussed diet and exercise.  Loose stool resolved on metformin XR.  Follow met b and a1c. Call with update.  ?

## 2021-07-12 NOTE — Assessment & Plan Note (Signed)
On crestor.  Low cholesterol diet and exercise.  Follow lipid panel and liver function tests.   

## 2021-07-15 ENCOUNTER — Other Ambulatory Visit: Payer: Self-pay | Admitting: Internal Medicine

## 2021-07-20 ENCOUNTER — Other Ambulatory Visit: Payer: Self-pay | Admitting: Internal Medicine

## 2021-07-20 DIAGNOSIS — Z1231 Encounter for screening mammogram for malignant neoplasm of breast: Secondary | ICD-10-CM

## 2021-07-21 ENCOUNTER — Other Ambulatory Visit (INDEPENDENT_AMBULATORY_CARE_PROVIDER_SITE_OTHER): Payer: Medicare Other

## 2021-07-21 DIAGNOSIS — E7800 Pure hypercholesterolemia, unspecified: Secondary | ICD-10-CM | POA: Diagnosis not present

## 2021-07-21 DIAGNOSIS — I10 Essential (primary) hypertension: Secondary | ICD-10-CM | POA: Diagnosis not present

## 2021-07-21 DIAGNOSIS — E1165 Type 2 diabetes mellitus with hyperglycemia: Secondary | ICD-10-CM

## 2021-07-21 LAB — HEMOGLOBIN A1C: Hgb A1c MFr Bld: 7.5 % — ABNORMAL HIGH (ref 4.6–6.5)

## 2021-07-21 LAB — BASIC METABOLIC PANEL
BUN: 20 mg/dL (ref 6–23)
CO2: 27 mEq/L (ref 19–32)
Calcium: 9.2 mg/dL (ref 8.4–10.5)
Chloride: 103 mEq/L (ref 96–112)
Creatinine, Ser: 0.83 mg/dL (ref 0.40–1.20)
GFR: 73.81 mL/min (ref >60.00)
Glucose, Bld: 149 mg/dL — ABNORMAL HIGH (ref 70–99)
Potassium: 4.3 mEq/L (ref 3.5–5.1)
Sodium: 139 mEq/L (ref 135–145)

## 2021-07-21 LAB — HEPATIC FUNCTION PANEL
ALT: 33 U/L (ref 0–35)
AST: 26 U/L (ref 0–37)
Albumin: 4.3 g/dL (ref 3.5–5.2)
Alkaline Phosphatase: 58 U/L (ref 39–117)
Bilirubin, Direct: 0.1 mg/dL (ref 0.0–0.3)
Total Bilirubin: 0.5 mg/dL (ref 0.2–1.2)
Total Protein: 7 g/dL (ref 6.0–8.3)

## 2021-07-21 LAB — LIPID PANEL
Cholesterol: 98 mg/dL (ref 0–200)
HDL: 40.8 mg/dL (ref >39.00)
LDL Cholesterol: 26 mg/dL (ref 0–99)
NonHDL: 57.03
Total CHOL/HDL Ratio: 2
Triglycerides: 155 mg/dL — ABNORMAL HIGH (ref 0.0–149.0)
VLDL: 31 mg/dL (ref 0.0–40.0)

## 2021-07-23 ENCOUNTER — Ambulatory Visit: Payer: Medicare Other | Admitting: Internal Medicine

## 2021-07-24 ENCOUNTER — Encounter: Payer: Self-pay | Admitting: Internal Medicine

## 2021-07-24 ENCOUNTER — Ambulatory Visit (INDEPENDENT_AMBULATORY_CARE_PROVIDER_SITE_OTHER): Payer: Medicare Other | Admitting: Internal Medicine

## 2021-07-24 ENCOUNTER — Other Ambulatory Visit: Payer: Self-pay | Admitting: Internal Medicine

## 2021-07-24 VITALS — BP 122/72 | HR 64 | Temp 97.9°F | Resp 14 | Ht 62.0 in | Wt 170.6 lb

## 2021-07-24 DIAGNOSIS — E1165 Type 2 diabetes mellitus with hyperglycemia: Secondary | ICD-10-CM

## 2021-07-24 DIAGNOSIS — I480 Paroxysmal atrial fibrillation: Secondary | ICD-10-CM

## 2021-07-24 DIAGNOSIS — F439 Reaction to severe stress, unspecified: Secondary | ICD-10-CM

## 2021-07-24 DIAGNOSIS — E1169 Type 2 diabetes mellitus with other specified complication: Secondary | ICD-10-CM

## 2021-07-24 DIAGNOSIS — Z114 Encounter for screening for human immunodeficiency virus [HIV]: Secondary | ICD-10-CM

## 2021-07-24 DIAGNOSIS — I10 Essential (primary) hypertension: Secondary | ICD-10-CM | POA: Diagnosis not present

## 2021-07-24 DIAGNOSIS — E785 Hyperlipidemia, unspecified: Secondary | ICD-10-CM

## 2021-07-24 NOTE — Progress Notes (Signed)
[name], female   DOB: 1955-12-24, 66 y.o.   MRN: [id number] ? ? ?Subjective:  ? ? [name], female    DOB: [birth year], 66 y.o.   MRN: [id number] ? ?This visit occurred during the SARS-CoV-2 public health emergency.  Safety protocols were in place, including screening questions prior to the visit, additional usage of staff PPE, and extensive cleaning of exam room while observing appropriate contact time as indicated for disinfecting solutions.  ? ?Patient here for a work in appt.  ?Chief Complaint  ?Patient presents with  ? Follow-up  ?  F/u to discuss labs  ? .  ? ?HPI ?Recent a1c 7.5.  started jardiance '10mg'$  q day.  Taking metformin.  Discussed low carb diet.  Discussed exercise.  Increased stress.  Stress with her husband's health issues and family issues.  Overall she feels she is handling things relatively well.  Discussed stress can increase sugars.  No chest pain or sob reported.  No abdominal pain.  Bowels stable.  ? ? ?Past Medical History:  ?Diagnosis Date  ? Achilles rupture, right   ? Diabetes mellitus without complication ([place])   ? Environmental allergies   ? Glaucoma   ? History of chicken pox   ? Hyperlipidemia   ? Hypertension   ? ?Past Surgical History:  ?Procedure Laterality Date  ? BREAST BIOPSY Right 1999  ? neg  ? COLONOSCOPY WITH PROPOFOL N/A 12/12/2017  ? Procedure: COLONOSCOPY WITH PROPOFOL;  Surgeon: Manya Silvas, MD;  Location: [facility] ENDOSCOPY;  Service: Endoscopy;  Laterality: N/A;  ? ?Family History  ?Problem Relation Age of Onset  ? Colon polyps Mother   ? Alzheimer's disease Mother   ? Heart attack Father 47  ? Hypertension Father   ? Diabetes Father   ? Hypertension Sister   ? Diabetes Sister   ?     x2  ? COPD Sister   ? Diabetes Sister   ? Hypertension Sister   ? Glaucoma Sister   ? Arrhythmia Son   ?     Faster heart beat than normal  ? Breast cancer Neg Hx   ? Colon cancer Neg Hx   ? ?Social History  ? ?Socioeconomic History  ?  Marital status: Married  ?  Spouse name: Not on file  ? Number of children: 3  ? Years of education: Not on file  ? Highest education level: Not on file  ?Occupational History  ?  Employer: [employer]  ?Tobacco Use  ? Smoking status: Never  ? Smokeless tobacco: Never  ?Vaping Use  ? Vaping Use: Never used  ?Substance and Sexual Activity  ? Alcohol use: No  ?  Alcohol/week: 0.0 standard drinks  ? Drug use: No  ? Sexual activity: Not on file  ?Other Topics Concern  ? Not on file  ?Social History Narrative  ? Not on file  ? ?Social Determinants of Health  ? ?Financial Resource Strain: Not on file  ?Food Insecurity: Not on file  ?Transportation Needs: Not on file  ?Physical Activity: Not on file  ?Stress: Not on file  ?Social Connections: Not on file  ? ? ? ?Review of Systems  ?Constitutional:  Negative for appetite change and unexpected weight change.  ?HENT:  Negative for congestion and sinus pressure.   ?Respiratory:  Negative for cough, chest tightness and shortness of breath.   ?Cardiovascular:  Negative for chest pain, palpitations and leg swelling.  ?Gastrointestinal:  Negative for abdominal pain,  diarrhea, nausea and vomiting.  ?Genitourinary:  Negative for difficulty urinating and dysuria.  ?Musculoskeletal:  Negative for joint swelling and myalgias.  ?Skin:  Negative for color change and rash.  ?Neurological:  Negative for dizziness, light-headedness and headaches.  ?Psychiatric/Behavioral:  Negative for agitation and dysphoric mood.   ? ?   ?Objective:  ?  ? ?BP 122/72 (BP Location: Left Arm, Patient Position: Sitting, Cuff Size: Small)   Pulse 64   Temp 97.9 ?F (36.6 ?C) (Temporal)   Resp 14   Ht '5\' 2"'$  (1.575 m)   Wt 170 lb 9.6 oz (77.4 kg)   LMP 02/26/2008   SpO2 96%   BMI 31.20 kg/m?  ?Wt Readings from Last 3 Encounters:  ?07/24/21 170 lb 9.6 oz (77.4 kg)  ?07/08/21 171 lb 3.2 oz (77.7 kg)  ?05/20/21 173 lb (78.5 kg)  ? ? ?Physical Exam ?Vitals reviewed.  ?Constitutional:   ?   General: She is not in  acute distress. ?   Appearance: Normal appearance.  ?HENT:  ?   Head: Normocephalic and atraumatic.  ?   Right Ear: External ear normal.  ?   Left Ear: External ear normal.  ?Eyes:  ?   General: No scleral icterus.    ?   Right eye: No discharge.     ?   Left eye: No discharge.  ?   Conjunctiva/sclera: Conjunctivae normal.  ?Neck:  ?   Thyroid: No thyromegaly.  ?Cardiovascular:  ?   Rate and Rhythm: Normal rate.  ?   Comments: Rated controlled.  ?Pulmonary:  ?   Effort: No respiratory distress.  ?   Breath sounds: Normal breath sounds. No wheezing.  ?Abdominal:  ?   General: Bowel sounds are normal.  ?   Palpations: Abdomen is soft.  ?   Tenderness: There is no abdominal tenderness.  ?Musculoskeletal:     ?   General: No swelling or tenderness.  ?   Cervical back: Neck supple. No tenderness.  ?Lymphadenopathy:  ?   Cervical: No cervical adenopathy.  ?Skin: ?   Findings: No erythema or rash.  ?Neurological:  ?   Mental Status: She is alert.  ?Psychiatric:     ?   Mood and Affect: Mood normal.     ?   Behavior: Behavior normal.  ? ? ? ?Outpatient Encounter Medications as of 07/24/2021  ?Medication Sig  ? amLODipine (NORVASC) 5 MG tablet TAKE ONE TABLET EVERY DAY  ? dorzolamide-timolol (COSOPT) 22.3-6.8 MG/ML ophthalmic solution Place 1 drop into both eyes 2 (two) times daily.  ? doxycycline (ADOXA) 50 MG tablet Take 1-2 tabs PO QD PRN flares  ? ELIQUIS 5 MG TABS tablet TAKE ONE TABLET BY MOUTH TWICE DAILY  ? empagliflozin (JARDIANCE) 10 MG TABS tablet Take 1 tablet (10 mg total) by mouth daily before breakfast.  ? Fluocinolone Acetonide Scalp 0.01 % OIL Apply to aa's scalp QD PRN flares.  ? GE100 BLOOD GLUCOSE TEST test strip TEST TWICE DAILY  ? ketoconazole (NIZORAL) 2 % shampoo Shampoo into the scalp let sit 10 minutes then wash out. Use 2-3 days per week.  ? lisinopril (ZESTRIL) 10 MG tablet TAKE 1 TABLET BY MOUTH DAILY  ? metFORMIN (GLUCOPHAGE-XR) 500 MG 24 hr tablet TAKE 2 TABLETS BY MOUTH TWICE DAILY.  ? Multiple  Vitamin (MULTIVITAMIN) tablet Take 1 tablet by mouth daily.  ? nystatin cream (MYCOSTATIN) Apply 1 application topically 2 (two) times daily.  ? rosuvastatin (CRESTOR) 10 MG tablet TAKE ONE TABLET  EVERY DAY  ? timolol (BETIMOL) 0.25 % ophthalmic solution 1-2 drops 2 (two) times daily.  ? triamcinolone cream (KENALOG) 0.1 % APPLY TO THE AFFECTED AREA DAILY AS NEEDED  ? vitamin E 200 UNIT capsule Take 200 Units by mouth daily.  ? [DISCONTINUED] metoprolol tartrate (LOPRESSOR) 25 MG tablet TAKE 1 TABLET BY MOUTH 2 TIMES DAILY.  ? ?No facility-administered encounter medications on file as of 07/24/2021.  ?  ? ?Lab Results  ?Component Value Date  ? WBC 6.4 11/28/2020  ? HGB 13.8 11/28/2020  ? HCT 41.2 11/28/2020  ? PLT 218.0 11/28/2020  ? GLUCOSE 149 (H) 07/21/2021  ? CHOL 98 07/21/2021  ? TRIG 155.0 (H) 07/21/2021  ? HDL 40.80 07/21/2021  ? LDLDIRECT 100.0 12/07/2017  ? [place] 26 07/21/2021  ? ALT 33 07/21/2021  ? AST 26 07/21/2021  ? NA 139 07/21/2021  ? K 4.3 07/21/2021  ? CL 103 07/21/2021  ? CREATININE 0.83 07/21/2021  ? BUN 20 07/21/2021  ? CO2 27 07/21/2021  ? TSH 1.04 11/28/2020  ? HGBA1C 7.5 (H) 07/21/2021  ? MICROALBUR 1.9 11/28/2020  ? ? ?CT CORONARY MORPH W/CTA COR W/SCORE W/CA W/CM &/OR WO/CM ? ?Addendum Date: 05/28/2021   ?ADDENDUM REPORT: 05/28/2021 16:53 EXAM: OVER-READ INTERPRETATION  CT CHEST The following report is an over-read performed by radiologist Dr. Barnetta Hammersmith [facility] on 05/28/2021. This over-read does not include interpretation of cardiac or coronary anatomy or pathology. The coronary CTA interpretation by the cardiologist is attached. COMPARISON:  None. FINDINGS: No significant noncardiac vascular findings. Visualized mediastinum and hilar regions demonstrate no lymphadenopathy or masses. Visualized lungs show no evidence of pulmonary edema, consolidation, pneumothorax, nodule or pleural fluid. Visualized upper abdomen and bony structures are unremarkable. IMPRESSION: No  significant incidental findings. Electronically Signed   By: Aletta Edouard M.D.   On: 05/28/2021 16:53  ? ?Result Date: 05/28/2021 ?CLINICAL DATA:  Chest pain EXAM: Cardiac/Coronary  CTA TECHNIQUE: The patient w

## 2021-07-27 ENCOUNTER — Encounter: Payer: Self-pay | Admitting: Internal Medicine

## 2021-07-27 MED ORDER — METOPROLOL TARTRATE 25 MG PO TABS: 25.0000 mg | ORAL_TABLET | Freq: Two times a day (BID) | ORAL | 1 refills | Status: DC

## 2021-07-27 NOTE — Telephone Encounter (Signed)
Rx sent in for metoprolol  

## 2021-08-02 ENCOUNTER — Encounter: Payer: Self-pay | Admitting: Internal Medicine

## 2021-08-02 NOTE — Assessment & Plan Note (Signed)
Low carb diet and exercise. Last a1c 7.5.  Recently started on jardiance. Discussed diet and exercise.  Loose stool resolved on metformin XR.  Follow met b and a1c. Call with update.  ?

## 2021-08-02 NOTE — Assessment & Plan Note (Signed)
On crestor.  Low cholesterol diet and exercise. Follow lipid panel and liver function tests.   Lab Results  Component Value Date   CHOL 98 07/21/2021   HDL 40.80 07/21/2021   LDLCALC 26 07/21/2021   LDLDIRECT 100.0 12/07/2017   TRIG 155.0 (H) 07/21/2021   CHOLHDL 2 07/21/2021   

## 2021-08-02 NOTE — Assessment & Plan Note (Signed)
Blood pressure as outlined.  Currently on lopressor, amlodipine and lisinopril.  Follow pressures.  Follow metabolic panel.  

## 2021-08-02 NOTE — Assessment & Plan Note (Signed)
On eliquis.  Continue metoprlol.  No chest pain or sob reported.  Rate controlled today.  Follow.  

## 2021-08-02 NOTE — Assessment & Plan Note (Signed)
Increased stress.  Discussed.  Overall appears to be handling things relatively well.  Follow.  

## 2021-09-16 ENCOUNTER — Other Ambulatory Visit: Payer: Self-pay | Admitting: Internal Medicine

## 2021-10-08 ENCOUNTER — Ambulatory Visit (INDEPENDENT_AMBULATORY_CARE_PROVIDER_SITE_OTHER): Payer: Medicare Other

## 2021-10-08 VITALS — Ht 62.0 in | Wt 170.0 lb

## 2021-10-08 DIAGNOSIS — Z0000 Encounter for general adult medical examination without abnormal findings: Secondary | ICD-10-CM | POA: Diagnosis not present

## 2021-10-08 NOTE — Patient Instructions (Addendum)
  Ms. Matzen , Thank you for taking time to come for your Medicare Wellness Visit. I appreciate your ongoing commitment to your health goals. Please review the following plan we discussed and let me know if I can assist you in the future.   These are the goals we discussed:  Goals      Increase physical activity     Walk 30 minutes daily         This is a list of the screening recommended for you and due dates:  Health Maintenance  Topic Date Due   Zoster (Shingles) Vaccine (1 of 2) 01/08/2022*   Pneumonia Vaccine (1 - PCV) 02/20/2022*   Tetanus Vaccine  07/25/2022*   Hepatitis C Screening: USPSTF Recommendation to screen - Ages 18-79 yo.  07/25/2022*   Flu Shot  12/15/2024*   Mammogram  12/25/2021   Hemoglobin A1C  01/20/2022   Complete foot exam   07/09/2022   Eye exam for diabetics  07/19/2022   Pap Smear  07/30/2023   Colon Cancer Screening  12/13/2027   DEXA scan (bone density measurement)  Completed   HIV Screening  Completed   HPV Vaccine  Aged Out   COVID-19 Vaccine  Discontinued  *Topic was postponed. The date shown is not the original due date.

## 2021-10-08 NOTE — Progress Notes (Signed)
Subjective:   April Solis is a 66 y.o. female who presents for an Initial Medicare Annual Wellness Visit.  Review of Systems    No ROS.  Medicare Wellness Virtual Visit.  Visual/audio telehealth visit, UTA vital signs.   See social history for additional risk factors.   Cardiac Risk Factors include: advanced age (>24mn, >>59women);diabetes mellitus;hypertension     Objective:    Today's Vitals   10/08/21 1402  Weight: 170 lb (77.1 kg)  Height: '5\' 2"'$  (1.575 m)   Body mass index is 31.09 kg/m.     10/08/2021    2:14 PM 12/12/2017    7:32 AM 10/16/2017    8:06 AM  Advanced Directives  Does Patient Have a Medical Advance Directive? Yes Yes No  Type of [place]amed[place]of [place] will [place] will   Does patient want to make changes to medical advance directive? No - Patient declined    Copy of [place]? No - copy requested Yes   Would patient like information on creating a medical advance directive?   No - Patient declined    Current Medications (verified) Outpatient Encounter Medications as of 10/08/2021  Medication Sig   amLODipine (NORVASC) 5 MG tablet TAKE ONE TABLET EVERY DAY   dorzolamide-timolol (COSOPT) 22.3-6.8 MG/ML ophthalmic solution Place 1 drop into both eyes 2 (two) times daily.   doxycycline (ADOXA) 50 MG tablet Take 1-2 tabs PO QD PRN flares   ELIQUIS 5 MG TABS tablet TAKE ONE TABLET BY MOUTH TWICE DAILY   Fluocinolone Acetonide Scalp 0.01 % OIL Apply to aa's scalp QD PRN flares.   GE100 BLOOD GLUCOSE TEST test strip TEST TWICE DAILY   JARDIANCE 10 MG TABS tablet TAKE ONE TABLET BY MOUTH EVERY DAY BEFORE BREAKFAST   ketoconazole (NIZORAL) 2 % shampoo Shampoo into the scalp let sit 10 minutes then wash out. Use 2-3 days per week.   lisinopril (ZESTRIL) 10 MG tablet TAKE 1 TABLET BY MOUTH DAILY   metFORMIN (GLUCOPHAGE-XR) 500 MG 24 hr tablet TAKE 2 TABLETS BY MOUTH  TWICE DAILY.   metoprolol tartrate (LOPRESSOR) 25 MG tablet Take 1 tablet (25 mg total) by mouth 2 (two) times daily.   Multiple Vitamin (MULTIVITAMIN) tablet Take 1 tablet by mouth daily.   nystatin cream (MYCOSTATIN) Apply 1 application topically 2 (two) times daily.   rosuvastatin (CRESTOR) 10 MG tablet TAKE ONE TABLET EVERY DAY   timolol (BETIMOL) 0.25 % ophthalmic solution 1-2 drops 2 (two) times daily.   triamcinolone cream (KENALOG) 0.1 % APPLY TO THE AFFECTED AREA DAILY AS NEEDED   vitamin E 200 UNIT capsule Take 200 Units by mouth daily.   No facility-administered encounter medications on file as of 10/08/2021.    Allergies (verified) Influenza vaccines, Rubbing alcohol [alcohol], and Other   History: Past Medical History:  Diagnosis Date   Achilles rupture, right    Diabetes mellitus without complication ([place]    Environmental allergies    Glaucoma    History of chicken pox    Hyperlipidemia    Hypertension    Past Surgical History:  Procedure Laterality Date   BREAST BIOPSY Right 1999   neg   COLONOSCOPY WITH PROPOFOL N/A 12/12/2017   Procedure: COLONOSCOPY WITH PROPOFOL;  Surgeon: EManya Silvas MD;  [facility]  Service: Endoscopy;  Laterality: N/A;   Family History  Problem Relation Age of Onset   Colon polyps Mother  Alzheimer's disease Mother    Heart attack Father 57   Hypertension Father    Diabetes Father    Hypertension Sister    Diabetes Sister        x2   COPD Sister    Diabetes Sister    Hypertension Sister    Glaucoma Sister    Arrhythmia Son        Faster heart beat than normal   Breast cancer Neg Hx    Colon cancer Neg Hx    Social History   Socioeconomic History   Marital status: Married    Spouse name: Not on file   Number of children: 3   Years of education: Not on file   Highest education level: Not on file  Occupational History    Employer: [employer]  Tobacco Use   Smoking status: Never   Smokeless tobacco:  Never  Vaping Use   Vaping Use: Never used  Substance and Sexual Activity   Alcohol use: No    Alcohol/week: 0.0 standard drinks of alcohol   Drug use: No   Sexual activity: Not on file  Other Topics Concern   Not on file  Social History Narrative   Not on file   Social Determinants of Health   Financial Resource Strain: Low Risk  (10/08/2021)   Overall Financial Resource Strain (CARDIA)    Difficulty of Paying Living Expenses: Not hard at all  Food Insecurity: No Food Insecurity (10/08/2021)   Hunger Vital Sign    Worried About Running Out of Food in the Last Year: Never true    Ran Out of Food in the Last Year: Never true  Transportation Needs: No Transportation Needs (10/08/2021)   PRAPARE - Hydrologist (Medical): No    Lack of Transportation (Non-Medical): No  Physical Activity: Sufficiently Active (10/08/2021)   Exercise Vital Sign    Days of Exercise per Week: 5 days    Minutes of Exercise per Session: 30 min  Stress: No Stress Concern Present (10/08/2021)   [place]    Feeling of Stress : Not at all  Social Connections: Unknown (10/08/2021)   Social Connection and Isolation Panel [NHANES]    Frequency of Communication with Friends and Family: More than three times a week    Frequency of Social Gatherings with Friends and Family: Not on file    Attends Religious Services: Not on file    Active Member of Clubs or Organizations: Not on file    Attends Archivist Meetings: Not on file    Marital Status: Not on file    Tobacco Counseling Counseling given: Not Answered   Clinical Intake:  Pre-visit preparation completed: Yes        Diabetes: Yes (Followed by PCP)  How often do you need to have someone help you when you read instructions, pamphlets, or other written materials from your doctor or pharmacy?: 1 - Never  Interpreter Needed?: No      Activities of  Daily Living    10/08/2021    2:55 PM  In your present state of health, do you have any difficulty performing the following activities:  Hearing? 0  Vision? 0  Difficulty concentrating or making decisions? 0  Walking or climbing stairs? 0  Dressing or bathing? 0  Doing errands, shopping? 0  Preparing Food and eating ? N  Using the Toilet? N  In the past six months, have you  accidently leaked urine? N  Do you have problems with loss of bowel control? N  Managing your Medications? N  Managing your Finances? N  Housekeeping or managing your Housekeeping? N    Patient Care Team: Einar Pheasant, MD as PCP - General (Internal Medicine) End, Harrell Gave, MD as PCP - Cardiology (Cardiology)  Indicate any recent Medical Services you may have received from other than [facility] providers in the past year (date may be approximate).     Assessment:   This is a routine wellness examination for Tessica.  Virtual Visit via Telephone Note  I connected with  Breshay Ilg on 10/08/21 at  2:00 PM EDT by telephone and verified that I am speaking with the correct person using two identifiers.  Persons participating in the virtual visit: patient/Nurse Health Advisor   I discussed the limitations of performing an evaluation and management service by telehealth. We continued and completed visit with audio only. Some vital signs may be absent or patient reported.   Hearing/Vision screen Hearing Screening - Comments:: Patient is able to hear conversational tones without difficulty.  No issues reported. Vision Screening - Comments:: Followed by [facility] Wears corrective lenses Glaucoma; visits every 6 months.  They have seen their ophthalmologist in the last 12 months.    Dietary issues and exercise activities discussed: Current Exercise Habits: Home exercise routine, Time (Minutes): 30, Frequency (Times/Week): 5, Weekly Exercise (Minutes/Week): 150, Intensity: Mild Regular diet     Goals Addressed             This Visit's Progress    Increase physical activity       Walk 30 minutes daily        Depression Screen    10/08/2021    2:06 PM 07/24/2021    2:34 PM 02/20/2021    3:41 PM 01/16/2020    8:12 AM 01/09/2019    8:43 AM 03/23/2017    8:35 AM 09/06/2016    8:05 AM  PHQ 2/9 Scores  PHQ - 2 Score 0 0 0 0 0 0 0  PHQ- 9 Score       0    Fall Risk    10/08/2021    2:55 PM 07/24/2021    2:34 PM 12/01/2020    9:03 AM 01/09/2019    8:49 AM 03/23/2017    8:35 AM  [place] in the past year? 0 0 0 0 No  Number falls in past yr: 0  0    Injury with Fall?   0    Risk for fall due to :  No Fall Risks No Fall Risks    Follow up Falls evaluation completed Falls evaluation completed Falls evaluation completed Falls evaluation completed     [place]: Home free of loose throw rugs in walkways, pet beds, electrical cords, etc? Yes  Adequate lighting in your home to reduce risk of falls? Yes   ASSISTIVE DEVICES UTILIZED TO PREVENT FALLS: Life alert? No  Use of a cane, walker or w/c? No   TIMED UP AND GO: Was the test performed? No .   Cognitive Function:  Patient is alert and oriented x3.       Immunizations Immunization History  Administered Date(s) Administered   DTaP 04/27/2005   Zoster, Live 02/03/2013   Screening Tests Health Maintenance  Topic Date Due   Zoster Vaccines- Shingrix (1 of 2) 01/08/2022 (Originally 10/25/1974)   Pneumonia Vaccine 65+ Years  old (1 - PCV) 02/20/2022 (Originally 10/24/2020)   TETANUS/TDAP  07/25/2022 (Originally 10/25/1974)   Hepatitis C Screening  07/25/2022 (Originally 10/24/1973)   INFLUENZA VACCINE  12/15/2024 (Originally 11/03/2021)   MAMMOGRAM  12/25/2021   HEMOGLOBIN A1C  01/20/2022   FOOT EXAM  07/09/2022   OPHTHALMOLOGY EXAM  07/19/2022   PAP SMEAR-Modifier  07/30/2023   COLONOSCOPY (Pts 45-5yr Insurance coverage will need to be confirmed)  12/13/2027   DEXA  SCAN  Completed   HIV Screening  Completed   HPV VACCINES  Aged Out   COVID-19 Vaccine  Discontinued   Health Maintenance There are no preventive care reminders to display for this patient.  Lung Cancer Screening: (Low Dose CT Chest recommended if Age 66-80years, 30 pack-year currently smoking OR have quit w/in 15years.) does not qualify.   Vision Screening: Recommended annual ophthalmology exams for early detection of glaucoma and other disorders of the eye.  Dental Screening: Recommended annual dental exams for proper oral hygiene  Community Resource Referral / Chronic Care Management: CRR required this visit?  No   CCM required this visit?  No      Plan:   Keep all routine maintenance appointments.   I have personally reviewed and noted the following in the patient's chart:   Medical and social history Use of alcohol, tobacco or illicit drugs  Current medications and supplements including opioid prescriptions. Patient is not currently taking opioid prescriptions. Functional ability and status Nutritional status Physical activity Advanced directives List of other physicians Hospitalizations, surgeries, and ER visits in previous 12 months Vitals Screenings to include cognitive, depression, and falls Referrals and appointments  In addition, I have reviewed and discussed with patient certain preventive protocols, quality metrics, and best practice recommendations. A written personalized care plan for preventive services as well as general preventive health recommendations were provided to patient.     OVarney Biles LPN   77/09/2829

## 2021-10-13 ENCOUNTER — Ambulatory Visit: Payer: Medicare Other | Admitting: Dermatology

## 2021-10-13 DIAGNOSIS — L739 Follicular disorder, unspecified: Secondary | ICD-10-CM | POA: Diagnosis not present

## 2021-10-13 DIAGNOSIS — L649 Androgenic alopecia, unspecified: Secondary | ICD-10-CM

## 2021-10-13 DIAGNOSIS — L659 Nonscarring hair loss, unspecified: Secondary | ICD-10-CM

## 2021-10-13 MED ORDER — MINOXIDIL 2.5 MG PO TABS: 2.5000 mg | ORAL_TABLET | Freq: Every day | ORAL | 3 refills | Status: DC

## 2021-10-13 MED ORDER — DOXYCYCLINE MONOHYDRATE 50 MG PO TABS: ORAL_TABLET | ORAL | 6 refills | Status: DC

## 2021-10-13 MED ORDER — FLUOCINOLONE ACETONIDE SCALP 0.01 % EX OIL: TOPICAL_OIL | CUTANEOUS | 6 refills | Status: DC

## 2021-10-13 MED ORDER — KETOCONAZOLE 2 % EX SHAM: MEDICATED_SHAMPOO | CUTANEOUS | 6 refills | Status: DC

## 2021-10-13 MED ORDER — FINASTERIDE 5 MG PO TABS: 5.0000 mg | ORAL_TABLET | Freq: Every day | ORAL | 3 refills | Status: DC

## 2021-10-13 NOTE — Progress Notes (Signed)
Follow-Up Visit   Subjective  April Solis is a 66 y.o. female who presents for the following: folliculitis  (Hx at scalp, using ketoconazole shampoo 2 - 3 x weekly, fluocinolone Acetonide Scalp 0.01 % oil qd prn flares, and doxycycline 50 mg tab 1 - 2 tabs qd prn for flares. Patient reports scalp is doing well while on current treatment and would like refills. ).  Also has thinning of hair on scalp which has gradually worsened over time, especially on crown.  Denies hx of breast ca.    The following portions of the chart were reviewed this encounter and updated as appropriate:      Review of Systems: No other skin or systemic complaints except as noted in HPI or Assessment and Plan.   Objective  Well appearing patient in no apparent distress; mood and affect are within normal limits.  A focused examination was performed including scalp. Relevant physical exam findings are noted in the Assessment and Plan.  Scalp Frontal scalp thinning with intact frontal hairline and miniaturization              right occipital scalp, scalp  mild erythema and scale at Right occipital scalp and crown   Assessment & Plan  Androgenic alopecia Scalp  Chronic and persistent condition with duration or expected duration over one year. Condition is symptomatic/ bothersome to patient.  Female Androgenic Alopecia is a chronic condition related to genetics and/or hormonal changes.  In women androgenetic alopecia is commonly associated with menopause but may occur any time after puberty.  It causes hair thinning primarily on the crown with widening of the part and temporal hairline recession.  Can use OTC Rogaine (minoxidil) 5% solution/foam as directed.  Oral treatments in female patients who have no contraindication may include : - Low dose oral minoxidil 1.25 - '5mg'$  daily - Spironolactone 50 - '100mg'$  bid - Finasteride 2.5 - 5 mg daily Adjunctive therapies include: - Low Level  Laser Light Therapy (LLLT) - Platelet-rich plasma injections (PRP) - Hair Transplants or scalp reduction  BP 120/76   Start  finasteride 5 mg tab take 1/2 tab by mouth daily.  Start minoxidil 2.5 mg tab take 1/2 tab by mouth daily.   Doses of minoxidil for hair loss are considered 'low dose'. This is because the doses used for hair loss are a lot lower than the doses which are used for conditions such as high blood pressure (hypertension). The doses used for hypertension are 10-'40mg'$  per day.  Side effects are uncommon at the low doses (up to 2.5 mg/day) used to treat hair loss. Potential side effects, more commonly seen at higher doses, include: Increase in hair growth (hypertrichosis) elsewhere on face and body Temporary hair shedding upon starting medication which may last up to 4 weeks Ankle swelling, fluid retention, rapid weight gain more than 5 pounds Low blood pressure and feeling lightheaded or dizzy when standing up quickly Fast or irregular heartbeat Headaches  finasteride (PROSCAR) 5 MG tablet - Scalp Take 1 tablet (5 mg total) by mouth daily.  minoxidil (LONITEN) 2.5 MG tablet - Scalp Take 1 tablet (2.5 mg total) by mouth daily.  Folliculitis right occipital scalp, scalp  Chronic condition with duration or expected duration over one year. Currently well-controlled.   Continue Doxycycline '50mg'$  po QD-BID PRN flares, Fluocinolone oil hs with occlusion PRN flares, and Ketoconazole 2% shampoo weekly as directed.    Patient to call if she needs refills of topical foam - she  will report name and frequency since we no longer have those records.   Doxycycline should be taken with food to prevent nausea. Do not lay down for 30 minutes after taking. Be cautious with sun exposure and use good sun protection while on this medication. Pregnant women should not take this medication.    Related Medications Fluocinolone Acetonide Scalp 0.01 % OIL Apply to aa's scalp QD PRN  flares.  ketoconazole (NIZORAL) 2 % shampoo Shampoo into the scalp let sit several minutes then wash out. Use 2-3 days per week.  doxycycline (ADOXA) 50 MG tablet Take 1-2 tabs PO QD PRN flares  Alopecia   Return in 3 months (on 01/13/2022) for TBSE, alopecia f/up. I, Ruthell Rummage, CMA, am acting as scribe for Brendolyn Patty, MD.  Documentation: I have reviewed the above documentation for accuracy and completeness, and I agree with the above.  Brendolyn Patty MD

## 2021-10-13 NOTE — Patient Instructions (Addendum)
Female Androgenic Alopecia is a chronic condition related to genetics and/or hormonal changes.  In women androgenetic alopecia is commonly associated with menopause but may occur any time after puberty.  It causes hair thinning primarily on the crown with widening of the part and temporal hairline recession.  Can use OTC Rogaine (minoxidil) 5% solution/foam as directed.  Oral treatments in female patients who have no contraindication may include : - Low dose oral minoxidil 1.25 - '5mg'$  daily - Spironolactone 50 - '100mg'$  bid - Finasteride 2.5 - 5 mg daily Adjunctive therapies include: - Low Level Laser Light Therapy (LLLT) - Platelet-rich plasma injections (PRP) - Hair Transplants or scalp reduction   Start Finasteride 5 mg tablet - take 1/2 tab by mouth daily.  Start Minoxidil 2.5 mg tab - take 1/2 tab by mouth daily   Use nightly  Recommend minoxidil 5% (Rogaine for men) solution or foam to be applied to the scalp and left in. This should ideally be used twice daily for best results but it helps with hair regrowth when used at least three times per week. Rogaine initially can cause increased hair shedding for the first few weeks but this will stop with continued use. In studies, people who used minoxidil (Rogaine) for at least 6 months had thicker hair than people who did not. Minoxidil topical (Rogaine) only works as long as it continues to be used. If if it is no longer used then the hair it has been helping to regrow can fall out. Minoxidil topical (Rogaine) can cause increased facial hair growth which can usually be managed easily with a battery-operated hair trimmer. If facial hair growth is bothersome, switching to the 2% women's version can decrease the risk of unwanted facial hair growth.  Doses of minoxidil for hair loss are considered 'low dose'. This is because the doses used for hair loss are a lot lower than the doses which are used for conditions such as high blood pressure  (hypertension). The doses used for hypertension are 10-'40mg'$  per day.  Side effects are uncommon at the low doses (up to 2.5 mg/day) used to treat hair loss. Potential side effects, more commonly seen at higher doses, include: Increase in hair growth (hypertrichosis) elsewhere on face and body Temporary hair shedding upon starting medication which may last up to 4 weeks Ankle swelling, fluid retention, rapid weight gain more than 5 pounds Low blood pressure and feeling lightheaded or dizzy when standing up quickly Fast or irregular heartbeat Headaches    For Scalp Health  Continue Doxycycline 50 mg tab - take 1 - 2 tabs by mouth daily as needed for flares  Continue ketoconazole shampoo - use 2 - 3 times weekly let sit for 10 mins then rinse out   Continue fluocinolone Acetonide Scalp - 0.01 % oil - apply to affected areas of scalp as needed for flares    Doxycycline should be taken with food to prevent nausea. Do not lay down for 30 minutes after taking. Be cautious with sun exposure and use good sun protection while on this medication. Pregnant women should not take this medication.   Due to recent changes in healthcare laws, you may see results of your pathology and/or laboratory studies on MyChart before the doctors have had a chance to review them. We understand that in some cases there may be results that are confusing or concerning to you. Please understand that not all results are received at the same time and often the doctors may need  to interpret multiple results in order to provide you with the best plan of care or course of treatment. Therefore, we ask that you please give [place] 2 business days to thoroughly review all your results before contacting the office for clarification. Should we see a critical lab result, you will be contacted sooner.   If You Need Anything After Your Visit  If you have any questions or concerns for your doctor, please call our main line at [phone]  and press option 4 to reach your doctor's medical assistant. If no one answers, please leave a voicemail as directed and we will return your call as soon as possible. Messages left after 4 pm will be answered the following business day.   You may also send [place] a message via [place]. We typically respond to MyChart messages within 1-2 business days.  For prescription refills, please ask your pharmacy to contact our office. Our fax number is [phone].  If you have an urgent issue when the clinic is closed that cannot wait until the next business day, you can page your doctor at the number below.    Please note that while we do our best to be available for urgent issues outside of office hours, we are not available 24/7.   If you have an urgent issue and are unable to reach [place], you may choose to seek medical care at your doctor's office, retail clinic, urgent care center, or emergency room.  If you have a medical emergency, please immediately call 911 or go to the emergency department.  Pager Numbers  - Dr. Nehemiah Massed: [phone]  - Dr. Laurence Ferrari: [phone]  - Dr. Nicole Kindred: [phone]  In the event of inclement weather, please call our main line at [phone] for an update on the status of any delays or closures.  Dermatology Medication Tips: Please keep the boxes that topical medications come in in order to help keep track of the instructions about where and how to use these. Pharmacies typically print the medication instructions only on the boxes and not directly on the medication tubes.   If your medication is too expensive, please contact our office at [phone] option 4 or send [place] a message through [place].   We are unable to tell what your co-pay for medications will be in advance as this is different depending on your insurance coverage. However, we may be able to find a substitute medication at lower cost or fill out paperwork to get insurance to cover a needed medication.    If a prior authorization is required to get your medication covered by your insurance company, please allow [place] 1-2 business days to complete this process.  Drug prices often vary depending on where the prescription is filled and some pharmacies may offer cheaper prices.  The website www.goodrx.com contains coupons for medications through different pharmacies. The prices here do not account for what the cost may be with help from insurance (it may be cheaper with your insurance), but the website can give you the price if you did not use any insurance.  - You can print the associated coupon and take it with your prescription to the pharmacy.  - You may also stop by our office during regular business hours and pick up a GoodRx coupon card.  - If you need your prescription sent electronically to a different pharmacy, notify our office through [facility] or by phone at [phone] option 4.     Si Usted Necesita Algo Despus de Su  Visita  Tambin puede enviarnos un mensaje a travs de MyChart. Por lo general respondemos a los mensajes de MyChart en el transcurso de 1 a 2 das hbiles.  Para renovar recetas, por favor pida a su farmacia que se ponga en contacto con nuestra oficina. Harland Dingwall de fax es [place] [phone].  Si tiene un asunto urgente cuando la clnica est cerrada y que no puede esperar hasta el siguiente da hbil, puede llamar/localizar a su doctor(a) al nmero que aparece a continuacin.   Por favor, tenga en cuenta que aunque hacemos todo lo posible para estar disponibles para asuntos urgentes fuera del horario de [place], no estamos disponibles las 24 horas del da, los 7 das de la [place].   Si tiene un problema urgente y no puede comunicarse con nosotros, puede optar por buscar atencin mdica  en el consultorio de su doctor(a), en una clnica privada, en un centro de atencin urgente o en una sala de emergencias.  Si tiene Engineering geologist, por favor  llame inmediatamente al 911 o vaya a la sala de emergencias.  Nmeros de bper  - Dr. Nehemiah Massed: [phone]  - Dra. Moye: [phone]  - Dra. Nicole Kindred: [phone]  En caso de inclemencias del [place], por favor llame a Johnsie Kindred principal al [phone] para una actualizacin sobre el [place] de cualquier retraso o cierre.  Consejos para la medicacin en dermatologa: Por favor, guarde las cajas en las que vienen los medicamentos de uso tpico para ayudarle a seguir las instrucciones sobre dnde y cmo usarlos. Las farmacias generalmente imprimen las instrucciones del medicamento slo en las cajas y no directamente en los tubos del [place].   Si su medicamento es muy caro, por favor, pngase en contacto con Zigmund Daniel llamando al [phone] y presione la opcin 4 o envenos un mensaje a travs de Pharmacist, community.   No podemos decirle cul ser su copago por los medicamentos por adelantado ya que esto es diferente dependiendo de la cobertura de su seguro. Sin embargo, es posible que podamos encontrar un medicamento sustituto a Electrical engineer un formulario para que el seguro cubra el medicamento que se considera necesario.   Si se requiere una autorizacin previa para que su compaa de seguros [place] su medicamento, por favor permtanos de 1 a 2 das hbiles para completar este proceso.  Los precios de los medicamentos varan con frecuencia dependiendo del Environmental consultant de dnde se surte la receta y alguna farmacias pueden ofrecer precios ms baratos.  El sitio web www.goodrx.com tiene cupones para medicamentos de Airline pilot. Los precios aqu no tienen en cuenta lo que podra costar con la ayuda del seguro (puede ser ms barato con su seguro), pero el sitio web puede darle el precio si no utiliz Research scientist (physical sciences).  - Puede imprimir el cupn correspondiente y llevarlo con su receta a la farmacia.  - Tambin puede pasar por nuestra oficina durante el horario de atencin regular y  Charity fundraiser una tarjeta de cupones de GoodRx.  - Si necesita que su receta se enve electrnicamente a una farmacia diferente, informe a nuestra oficina a travs de MyChart de [place] o por telfono llamando al [phone] y presione la opcin 4.

## 2021-10-21 ENCOUNTER — Encounter: Payer: Medicare Other | Admitting: Internal Medicine

## 2021-11-13 ENCOUNTER — Encounter: Payer: Self-pay | Admitting: Internal Medicine

## 2021-11-13 ENCOUNTER — Ambulatory Visit (INDEPENDENT_AMBULATORY_CARE_PROVIDER_SITE_OTHER): Payer: Medicare Other | Admitting: Internal Medicine

## 2021-11-13 VITALS — BP 118/72 | HR 71 | Temp 98.3°F | Resp 17 | Ht 62.0 in | Wt 169.0 lb

## 2021-11-13 DIAGNOSIS — Z113 Encounter for screening for infections with a predominantly sexual mode of transmission: Secondary | ICD-10-CM

## 2021-11-13 DIAGNOSIS — F439 Reaction to severe stress, unspecified: Secondary | ICD-10-CM

## 2021-11-13 DIAGNOSIS — E1169 Type 2 diabetes mellitus with other specified complication: Secondary | ICD-10-CM | POA: Diagnosis not present

## 2021-11-13 DIAGNOSIS — E1165 Type 2 diabetes mellitus with hyperglycemia: Secondary | ICD-10-CM | POA: Diagnosis not present

## 2021-11-13 DIAGNOSIS — Z0000 Encounter for general adult medical examination without abnormal findings: Secondary | ICD-10-CM | POA: Diagnosis not present

## 2021-11-13 DIAGNOSIS — I10 Essential (primary) hypertension: Secondary | ICD-10-CM

## 2021-11-13 DIAGNOSIS — E785 Hyperlipidemia, unspecified: Secondary | ICD-10-CM

## 2021-11-13 DIAGNOSIS — I480 Paroxysmal atrial fibrillation: Secondary | ICD-10-CM

## 2021-11-13 NOTE — Assessment & Plan Note (Addendum)
Physical today 11/13/21.  Mammogram 12/25/20 - Birads I.  colonosocpy 12/2017 - as outlined.

## 2021-11-13 NOTE — Progress Notes (Signed)
[name], female   DOB: [birth year], 66 y.o.   MRN: [id number]   Subjective:    [name], female    DOB: 11-30-1955, 66 y.o.   MRN: [id number]   Patient here for physical exam.   Chief Complaint  Patient presents with   Follow-up    Yearly CPE   .   HPI Reports she is doing relatively well.  States am sugars averaging 130-140 and pm sugars 120s.  Walking 2 miles per day.  No chest pain or sob reported.  No abdominal pain or bowel change reported.  Request to be checked for STDs.     Past Medical History:  Diagnosis Date   Achilles rupture, right    Diabetes mellitus without complication ([place])    Environmental allergies    Glaucoma    History of chicken pox    Hyperlipidemia    Hypertension    Past Surgical History:  Procedure Laterality Date   BREAST BIOPSY Right 1999   neg   COLONOSCOPY WITH PROPOFOL N/A 12/12/2017   Procedure: COLONOSCOPY WITH PROPOFOL;  Surgeon: Manya Silvas, MD;  Location: [facility];  Service: Endoscopy;  Laterality: N/A;   Family History  Problem Relation Age of Onset   Colon polyps Mother    Alzheimer's disease Mother    Heart attack Father 10   Hypertension Father    Diabetes Father    Hypertension Sister    Diabetes Sister        x2   COPD Sister    Diabetes Sister    Hypertension Sister    Glaucoma Sister    Arrhythmia Son        Faster heart beat than normal   Breast cancer Neg Hx    Colon cancer Neg Hx    Social History   Socioeconomic History   Marital status: Married    Spouse name: Not on file   Number of children: 3   Years of education: Not on file   Highest education level: Not on file  Occupational History    Employer: [employer]  Tobacco Use   Smoking status: Never   Smokeless tobacco: Never  Vaping Use   Vaping Use: Never used  Substance and Sexual Activity   Alcohol use: No    Alcohol/week: 0.0 standard drinks of alcohol   Drug use: No   Sexual activity:  Not on file  Other Topics Concern   Not on file  Social History Narrative   Not on file   Social Determinants of Health   Financial Resource Strain: Low Risk  (10/08/2021)   Overall Financial Resource Strain (CARDIA)    Difficulty of Paying Living Expenses: Not hard at all  Food Insecurity: No Food Insecurity (10/08/2021)   Hunger Vital Sign    Worried About Running Out of Food in the Last Year: Never true    Ran Out of Food in the Last Year: Never true  Transportation Needs: No Transportation Needs (10/08/2021)   PRAPARE - Hydrologist (Medical): No    Lack of Transportation (Non-Medical): No  Physical Activity: Sufficiently Active (10/08/2021)   Exercise Vital Sign    Days of Exercise per Week: 5 days    Minutes of Exercise per Session: 30 min  Stress: No Stress Concern Present (10/08/2021)   [place]    Feeling of Stress : Not at all  Social Connections: Unknown (  10/08/2021)   Social Connection and Isolation Panel [NHANES]    Frequency of Communication with Friends and Family: More than three times a week    Frequency of Social Gatherings with Friends and Family: Not on file    Attends Religious Services: Not on file    Active Member of Clubs or Organizations: Not on file    Attends Archivist Meetings: Not on file    Marital Status: Not on file     Review of Systems  Constitutional:  Negative for appetite change and unexpected weight change.  HENT:  Negative for congestion, sinus pressure and sore throat.   Eyes:  Negative for pain and visual disturbance.  Respiratory:  Negative for cough, chest tightness and shortness of breath.   Cardiovascular:  Negative for chest pain, palpitations and leg swelling.  Gastrointestinal:  Negative for abdominal pain, diarrhea, nausea and vomiting.  Genitourinary:  Negative for difficulty urinating and dysuria.  Musculoskeletal:  Negative for  joint swelling and myalgias.  Skin:  Negative for color change and rash.  Neurological:  Negative for dizziness, light-headedness and headaches.  Hematological:  Negative for adenopathy. Does not bruise/bleed easily.  Psychiatric/Behavioral:  Negative for agitation and dysphoric mood.        Objective:     BP 118/72 (BP Location: Left Arm, Patient Position: Sitting, Cuff Size: Small)   Pulse 71   Temp 98.3 F (36.8 C) (Temporal)   Resp 17   Ht _0  (1.575 m)   Wt 169 lb (76.7 kg)   LMP 02/26/2008   SpO2 98%   BMI 30.91 kg/m  Wt Readings from Last 3 Encounters:  11/13/21 169 lb (76.7 kg)  10/08/21 170 lb (77.1 kg)  07/24/21 170 lb 9.6 oz (77.4 kg)    Physical Exam Vitals reviewed.  Constitutional:      General: She is not in acute distress.    Appearance: Normal appearance. She is well-developed.  HENT:     Head: Normocephalic and atraumatic.     Right Ear: External ear normal.     Left Ear: External ear normal.  Eyes:     General: No scleral icterus.       Right eye: No discharge.        Left eye: No discharge.     Conjunctiva/sclera: Conjunctivae normal.  Neck:     Thyroid: No thyromegaly.  Cardiovascular:     Rate and Rhythm: Normal rate and regular rhythm.  Pulmonary:     Effort: No tachypnea, accessory muscle usage or respiratory distress.     Breath sounds: Normal breath sounds. No decreased breath sounds or wheezing.  Chest:  Breasts:    Right: No inverted nipple, mass, nipple discharge or tenderness (no axillary adenopathy).     Left: No inverted nipple, mass, nipple discharge or tenderness (no axilarry adenopathy).  Abdominal:     General: Bowel sounds are normal.     Palpations: Abdomen is soft.     Tenderness: There is no abdominal tenderness.  Musculoskeletal:        General: No swelling or tenderness.     Cervical back: Neck supple.  Lymphadenopathy:     Cervical: No cervical adenopathy.  Skin:    Findings: No erythema or rash.   Neurological:     Mental Status: She is alert and oriented to person, place, and time.  Psychiatric:        Mood and Affect: Mood normal.        Behavior: Behavior  normal.      Outpatient Encounter Medications as of 11/13/2021  Medication Sig   amLODipine (NORVASC) 5 MG tablet TAKE ONE TABLET EVERY DAY   dorzolamide-timolol (COSOPT) 22.3-6.8 MG/ML ophthalmic solution Place 1 drop into both eyes 2 (two) times daily.   doxycycline (ADOXA) 50 MG tablet Take 1-2 tabs PO QD PRN flares   ELIQUIS 5 MG TABS tablet TAKE ONE TABLET BY MOUTH TWICE DAILY   finasteride (PROSCAR) 5 MG tablet Take 1 tablet (5 mg total) by mouth daily.   Fluocinolone Acetonide Scalp 0.01 % OIL Apply to aa's scalp QD PRN flares.   GE100 BLOOD GLUCOSE TEST test strip TEST TWICE DAILY   JARDIANCE 10 MG TABS tablet TAKE ONE TABLET BY MOUTH EVERY DAY BEFORE BREAKFAST   ketoconazole (NIZORAL) 2 % shampoo Shampoo into the scalp let sit several minutes then wash out. Use 2-3 days per week.   lisinopril (ZESTRIL) 10 MG tablet TAKE 1 TABLET BY MOUTH DAILY   metFORMIN (GLUCOPHAGE-XR) 500 MG 24 hr tablet TAKE 2 TABLETS BY MOUTH TWICE DAILY.   metoprolol tartrate (LOPRESSOR) 25 MG tablet Take 1 tablet (25 mg total) by mouth 2 (two) times daily.   minoxidil (LONITEN) 2.5 MG tablet Take 1 tablet (2.5 mg total) by mouth daily.   Multiple Vitamin (MULTIVITAMIN) tablet Take 1 tablet by mouth daily.   nystatin cream (MYCOSTATIN) Apply 1 application topically 2 (two) times daily.   rosuvastatin (CRESTOR) 10 MG tablet TAKE ONE TABLET EVERY DAY   timolol (BETIMOL) 0.25 % ophthalmic solution 1-2 drops 2 (two) times daily.   triamcinolone cream (KENALOG) 0.1 % APPLY TO THE AFFECTED AREA DAILY AS NEEDED   vitamin E 200 UNIT capsule Take 200 Units by mouth daily.   No facility-administered encounter medications on file as of 11/13/2021.     Lab Results  Component Value Date   WBC 6.4 11/28/2020   HGB 13.8 11/28/2020   HCT 41.2  11/28/2020   PLT 218.0 11/28/2020   GLUCOSE 149 (H) 07/21/2021   CHOL 98 07/21/2021   TRIG 155.0 (H) 07/21/2021   HDL 40.80 07/21/2021   LDLDIRECT 100.0 12/07/2017   LDLCALC 26 07/21/2021   ALT 33 07/21/2021   AST 26 07/21/2021   NA 139 07/21/2021   K 4.3 07/21/2021   CL 103 07/21/2021   CREATININE 0.83 07/21/2021   BUN 20 07/21/2021   CO2 27 07/21/2021   TSH 1.04 11/28/2020   HGBA1C 7.5 (H) 07/21/2021   MICROALBUR 1.9 11/28/2020    CT CORONARY MORPH W/CTA COR W/SCORE W/CA W/CM &/OR WO/CM  Addendum Date: 05/28/2021   ADDENDUM REPORT: 05/28/2021 16:53 EXAM: OVER-READ INTERPRETATION  CT CHEST The following report is an over-read performed by radiologist Dr. Barnetta Hammersmith [facility] on 05/28/2021. This over-read does not include interpretation of cardiac or coronary anatomy or pathology. The coronary CTA interpretation by the cardiologist is attached. COMPARISON:  None. FINDINGS: No significant noncardiac vascular findings. Visualized mediastinum and hilar regions demonstrate no lymphadenopathy or masses. Visualized lungs show no evidence of pulmonary edema, consolidation, pneumothorax, nodule or pleural fluid. Visualized upper abdomen and bony structures are unremarkable. IMPRESSION: No significant incidental findings. Electronically Signed   By: Aletta Edouard M.D.   On: 05/28/2021 16:53   Result Date: 05/28/2021 CLINICAL DATA:  Chest pain EXAM: Cardiac/Coronary  CTA TECHNIQUE: The patient was scanned on a Siemens Somatom go.Top scanner. : A retrospective scan was triggered in the descending thoracic aorta. Axial non-contrast 3 mm slices were carried out  through the heart. The data set was analyzed on a dedicated work station and scored using the [place]. Gantry rotation speed was 330 msecs and collimation was .6 mm. 133m of metoprolol and 0.8 mg of sl NTG was given. The 3D data set was reconstructed in 5% intervals of the 60-95 % of the R-R cycle. Diastolic phases  were analyzed on a dedicated work station using MPR, MIP and VRT modes. The patient received 75 cc of contrast. FINDINGS: Aorta: Normal size. Minimal aortic root calcifications. No dissection. Aortic Valve:  Trileaflet.  No calcifications. Coronary Arteries:  Normal coronary origin.  Right dominance. RCA is a dominant artery that gives rise to PDA and PLA. There is no plaque. Left main gives rise to LAD and LCX arteries. There is no LM disease. LAD has no plaque. LCX is a non-dominant artery that gives rise one OM1 branch. There is no plaque. Other findings: Normal pulmonary vein drainage into the left atrium. Normal left atrial appendage without a thrombus. Normal size of the pulmonary artery. IMPRESSION: 1. Normal coronary calcium score of 0. Patient is low risk for coronary events. 2.  Normal coronary origin with right dominance. 3.  No evidence of CAD. 4.  CAD-RADS 0.  Consider non-atherosclerotic causes of chest pain. Electronically Signed: By: BKate SableM.D. On: 05/28/2021 15:11       Assessment & Plan:   Problem List Items Addressed This Visit     Diabetes mellitus ([place]    Low carb diet and exercise. Last a1c 7.5.  Has started on jardiance. Discussed diet and exercise.  Loose stool resolved on metformin XR.  Follow met b and a1c. Sugars appear to be doing better.  Follow.       Relevant Orders   HgB A1c   Essential hypertension    Blood pressure as outlined.  Currently on lopressor, amlodipine and lisinopril.  Consider increase lopressor 518mbid - given increased HR.  Follow pressures.  Follow metabolic panel.       Relevant Orders   Basic Metabolic Panel (BMET)   Health care maintenance    Physical today 11/13/21.  Mammogram 12/25/20 - Birads I.  colonosocpy 12/2017 - as outlined.       Hyperlipidemia associated with type 2 diabetes mellitus ([place]   On crestor.  Low cholesterol diet and exercise. Follow lipid panel and liver function tests.   Lab Results  Component Value  Date   CHOL 98 07/21/2021   HDL 40.80 07/21/2021   LDLCALC 26 07/21/2021   LDLDIRECT 100.0 12/07/2017   TRIG 155.0 (H) 07/21/2021   CHOLHDL 2 07/21/2021       Relevant Orders   Lipid Profile   Hepatic function panel   CBC w/Diff   TSH   PAF ([place]oxysmal [place] fibrillation) ([place]   On eliquis.  Continue metoprlol.  No chest pain or sob reported.  Rate controlled today.  Follow.       Screening for STDs (sexually transmitted diseases)    Discussed.  Request STD check.        Relevant Orders   HIV antibody (with reflex)   Stress    Overall appears to be handling stress relatively well.  Does not feel needs any further intervention.  Follow.       Other Visit Diagnoses     Routine general medical examination at a health care facility    -  Primary   Screen for STD (sexually transmitted disease)  Relevant Orders   RPR   Urine cytology ancillary only([place])   HSV 2 antibody, IgG        Einar Pheasant, MD

## 2021-11-17 NOTE — Progress Notes (Deleted)
Cardiology Clinic Note   Patient Name: April Solis Date of Encounter: 11/17/2021  Primary Care Provider:  Einar Pheasant, MD Primary Cardiologist:  Nelva Bush, MD  Patient Profile    66 year old female with a history of paroxysmal atrial fibrillation, hypertension, hyperlipidemia, type 2 diabetes, glaucoma, and environmental allergies, who presented for follow up of atrial fibrillation.   Past Medical History    Past Medical History:  Diagnosis Date   Achilles rupture, right    Diabetes mellitus without complication ([place])    Environmental allergies    Glaucoma    History of chicken pox    Hyperlipidemia    Hypertension    Past Surgical History:  Procedure Laterality Date   BREAST BIOPSY Right 1999   neg   COLONOSCOPY WITH PROPOFOL N/A 12/12/2017   Procedure: COLONOSCOPY WITH PROPOFOL;  Surgeon: Manya Silvas, MD;  Location: [facility] ENDOSCOPY;  Service: Endoscopy;  Laterality: N/A;    Allergies  Allergies  Allergen Reactions   Influenza Vaccines Other (See Comments)    Flu symptoms two hours after injections   Rubbing Alcohol [Alcohol]    Other Rash    Poison ivy    History of Present Illness    66 year old female with the above past medical history of paroxysmal atrial fibrillation on apixaban and metoprolol, hypertension, hyperlipidemia, type 2 diabetes mellitus, glaucoma, and environmental allergies.   Her cardiac history started with an evaluation in 02/2021 when she was sent to Dr. Saunders Revel by her PCP for the evaluation of atrial fibrillation.  EKG at that time demonstrated rate controlled atrial fibrillation.  She was then started on apixaban and metoprolol for rate control in lieu of her triamterene-HCTZ.  She continued feeling more tired in the usual day with not sleeping as well was as well as feeling increased palpitations that were abated after ceasing her caffeine consumption.  In 11/22 she had followed up with her primary care provider and  had high ventricular rates of atrial fibrillation around 130.  She noted some palpitations at that time was otherwise asymptomatic.  Dr. Nicki Reaper increased her metoprolol to 25 mg twice daily.   03/2021 Echocardiogram revealed LVEF 60- 65%, no regional wall motion abnormalities, and mild MR.   She was seen in clinic 05/20/2021 with complaints of left-sided chest pain that radiated to the center of her chest.  She described it as a tightness, it lasted approximately 10 minutes, resolved when she got up and began to walk around.  With the episode that she had it was decided that she will undergo coronary CTA for further evaluation.  05/28/2020 patient underwent coronary CTA which revealed normal coronary calcium score of 0.  Patient low risk for coronary events, normal coronary arteries with right dominant, no evidence of CAD, consider nonatherosclerotic causes of chest pain.  She returns to clinic today  Home Medications    Current Outpatient Medications  Medication Sig Dispense Refill   amLODipine (NORVASC) 5 MG tablet TAKE ONE TABLET EVERY DAY 90 tablet 0   dorzolamide-timolol (COSOPT) 22.3-6.8 MG/ML ophthalmic solution Place 1 drop into both eyes 2 (two) times daily.     doxycycline (ADOXA) 50 MG tablet Take 1-2 tabs PO QD PRN flares 60 tablet 6   ELIQUIS 5 MG TABS tablet TAKE ONE TABLET BY MOUTH TWICE DAILY 60 tablet 2   finasteride (PROSCAR) 5 MG tablet Take 1 tablet (5 mg total) by mouth daily. 30 tablet 3   Fluocinolone Acetonide Scalp  0.01 % OIL Apply to aa's scalp QD PRN flares. 118.28 mL 6   GE100 BLOOD GLUCOSE TEST test strip TEST TWICE DAILY 100 each 2   JARDIANCE 10 MG TABS tablet TAKE ONE TABLET BY MOUTH EVERY DAY BEFORE BREAKFAST 30 tablet 2   ketoconazole (NIZORAL) 2 % shampoo Shampoo into the scalp let sit several minutes then wash out. Use 2-3 days per week. 120 mL 6   lisinopril (ZESTRIL) 10 MG tablet TAKE 1 TABLET BY MOUTH DAILY 90 tablet 0   metFORMIN (GLUCOPHAGE-XR) 500 MG 24  hr tablet TAKE 2 TABLETS BY MOUTH TWICE DAILY. 120 tablet 3   metoprolol tartrate (LOPRESSOR) 25 MG tablet Take 1 tablet (25 mg total) by mouth 2 (two) times daily. 180 tablet 1   minoxidil (LONITEN) 2.5 MG tablet Take 1 tablet (2.5 mg total) by mouth daily. 30 tablet 3   Multiple Vitamin (MULTIVITAMIN) tablet Take 1 tablet by mouth daily.     nystatin cream (MYCOSTATIN) Apply 1 application topically 2 (two) times daily. 30 g 0   rosuvastatin (CRESTOR) 10 MG tablet TAKE ONE TABLET EVERY DAY 90 tablet 3   timolol (BETIMOL) 0.25 % ophthalmic solution 1-2 drops 2 (two) times daily.     triamcinolone cream (KENALOG) 0.1 % APPLY TO THE AFFECTED AREA DAILY AS NEEDED 45 g 1   vitamin E 200 UNIT capsule Take 200 Units by mouth daily.     No current facility-administered medications for this visit.     Family History    Family History  Problem Relation Age of Onset   Colon polyps Mother    Alzheimer's disease Mother    Heart attack Father 1   Hypertension Father    Diabetes Father    Hypertension Sister    Diabetes Sister        x2   COPD Sister    Diabetes Sister    Hypertension Sister    Glaucoma Sister    Arrhythmia Son        Faster heart beat than normal   Breast cancer Neg Hx    Colon cancer Neg Hx    She indicated that her mother is deceased. She indicated that her father is deceased. She indicated that both of her sisters are alive. She indicated that her son is alive. She indicated that the status of her neg hx is unknown.  Social History    Social History   Socioeconomic History   Marital status: Married    Spouse name: Not on file   Number of children: 3   Years of education: Not on file   Highest education level: Not on file  Occupational History    Employer: [employer]  Tobacco Use   Smoking status: Never   Smokeless tobacco: Never  Vaping Use   Vaping Use: Never used  Substance and Sexual Activity   Alcohol use: No    Alcohol/week: 0.0 standard drinks of alcohol    Drug use: No   Sexual activity: Not on file  Other Topics Concern   Not on file  Social History Narrative   Not on file   Social Determinants of Health   Financial Resource Strain: Low Risk  (10/08/2021)   Overall Financial Resource Strain (CARDIA)    Difficulty of Paying Living Expenses: Not hard at all  Food Insecurity: No Food Insecurity (10/08/2021)   Hunger Vital Sign    Worried About Running Out of Food in the Last Year: Never true    Ran  Out of Food in the Last Year: Never true  Transportation Needs: No Transportation Needs (10/08/2021)   PRAPARE - Hydrologist (Medical): No    Lack of Transportation (Non-Medical): No  Physical Activity: Sufficiently Active (10/08/2021)   Exercise Vital Sign    Days of Exercise per Week: 5 days    Minutes of Exercise per Session: 30 min  Stress: No Stress Concern Present (10/08/2021)   [place]    Feeling of Stress : Not at all  Social Connections: Unknown (10/08/2021)   Social Connection and Isolation Panel [NHANES]    Frequency of Communication with Friends and Family: More than three times a week    Frequency of Social Gatherings with Friends and Family: Not on file    Attends Religious Services: Not on file    Active Member of Clubs or Organizations: Not on file    Attends Archivist Meetings: Not on file    Marital Status: Not on file  Intimate Partner Violence: Not At Risk (10/08/2021)   Humiliation, Afraid, Rape, and Kick questionnaire    Fear of Current or Ex-Partner: No    Emotionally Abused: No    Physically Abused: No    Sexually Abused: No     Review of Systems    General:  No chills, fever, night sweats or weight changes.  Cardiovascular:  No chest pain, dyspnea on exertion, edema, orthopnea, palpitations, paroxysmal nocturnal dyspnea. Dermatological: No rash, lesions/masses Respiratory: No cough, dyspnea Urologic: No  hematuria, dysuria Abdominal:   No nausea, vomiting, diarrhea, bright red blood per rectum, melena, or hematemesis Neurologic:  No visual changes, wkns, changes in mental status. All other systems reviewed and are otherwise negative except as noted above.     Physical Exam    VS:  LMP 02/26/2008  , BMI There is no height or weight on file to calculate BMI.     GEN: Well nourished, well developed, in no acute distress. HEENT: normal. Neck: Supple, no JVD, carotid bruits, or masses. Cardiac: RRR, no murmurs, rubs, or gallops. No clubbing, cyanosis, edema.  Radials/DP/PT 2+ and equal bilaterally.  Respiratory:  Respirations regular and unlabored, clear to auscultation bilaterally. GI: Soft, nontender, nondistended, BS + x 4. MS: no deformity or atrophy. Skin: warm and dry, no rash. Neuro:  Strength and sensation are intact. Psych: Normal affect.  Accessory Clinical Findings    ECG personally reviewed by me today- *** - No acute changes  Lab Results  Component Value Date   WBC 6.4 11/28/2020   HGB 13.8 11/28/2020   HCT 41.2 11/28/2020   MCV 91.2 11/28/2020   PLT 218.0 11/28/2020   Lab Results  Component Value Date   CREATININE 0.83 07/21/2021   BUN 20 07/21/2021   NA 139 07/21/2021   K 4.3 07/21/2021   CL 103 07/21/2021   CO2 27 07/21/2021   Lab Results  Component Value Date   ALT 33 07/21/2021   AST 26 07/21/2021   ALKPHOS 58 07/21/2021   BILITOT 0.5 07/21/2021   Lab Results  Component Value Date   CHOL 98 07/21/2021   HDL 40.80 07/21/2021   LDLCALC 26 07/21/2021   LDLDIRECT 100.0 12/07/2017   TRIG 155.0 (H) 07/21/2021   CHOLHDL 2 07/21/2021    Lab Results  Component Value Date   HGBA1C 7.5 (H) 07/21/2021    Assessment & Plan   1.  ***  Zacchary Pompei, NP 11/17/2021,  11:50 AM

## 2021-11-18 ENCOUNTER — Ambulatory Visit: Payer: Medicare Other | Admitting: Internal Medicine

## 2021-11-19 ENCOUNTER — Ambulatory Visit: Payer: Medicare Other | Admitting: Cardiology

## 2021-11-19 ENCOUNTER — Ambulatory Visit: Payer: Medicare Other | Admitting: Internal Medicine

## 2021-11-22 ENCOUNTER — Encounter: Payer: Self-pay | Admitting: Internal Medicine

## 2021-11-22 DIAGNOSIS — Z113 Encounter for screening for infections with a predominantly sexual mode of transmission: Secondary | ICD-10-CM | POA: Insufficient documentation

## 2021-11-22 NOTE — Assessment & Plan Note (Signed)
On eliquis.  Continue metoprlol.  No chest pain or sob reported.  Rate controlled today.  Follow.  

## 2021-11-22 NOTE — Assessment & Plan Note (Signed)
Low carb diet and exercise. Last a1c 7.5.  Has started on jardiance. Discussed diet and exercise.  Loose stool resolved on metformin XR.  Follow met b and a1c. Sugars appear to be doing better.  Follow.

## 2021-11-22 NOTE — Assessment & Plan Note (Signed)
Discussed.  Request STD check.

## 2021-11-22 NOTE — Assessment & Plan Note (Signed)
On crestor.  Low cholesterol diet and exercise. Follow lipid panel and liver function tests.   Lab Results  Component Value Date   CHOL 98 07/21/2021   HDL 40.80 07/21/2021   LDLCALC 26 07/21/2021   LDLDIRECT 100.0 12/07/2017   TRIG 155.0 (H) 07/21/2021   CHOLHDL 2 07/21/2021

## 2021-11-22 NOTE — Assessment & Plan Note (Signed)
Blood pressure as outlined.  Currently on lopressor, amlodipine and lisinopril.  Consider increase lopressor '50mg'$  bid - given increased HR.  Follow pressures.  Follow metabolic panel.

## 2021-11-22 NOTE — Assessment & Plan Note (Signed)
Overall appears to be handling stress relatively well.  Does not feel needs any further intervention.  Follow.

## 2021-12-01 ENCOUNTER — Other Ambulatory Visit (INDEPENDENT_AMBULATORY_CARE_PROVIDER_SITE_OTHER): Payer: Medicare Other

## 2021-12-01 ENCOUNTER — Other Ambulatory Visit (HOSPITAL_COMMUNITY)
Admission: RE | Admit: 2021-12-01 | Discharge: 2021-12-01 | Disposition: A | Discharge status: Home / Self Care | Payer: Medicare Other | Source: Ambulatory Visit | Attending: Internal Medicine | Admitting: Internal Medicine

## 2021-12-01 DIAGNOSIS — E1165 Type 2 diabetes mellitus with hyperglycemia: Secondary | ICD-10-CM

## 2021-12-01 DIAGNOSIS — E785 Hyperlipidemia, unspecified: Secondary | ICD-10-CM

## 2021-12-01 DIAGNOSIS — Z113 Encounter for screening for infections with a predominantly sexual mode of transmission: Secondary | ICD-10-CM

## 2021-12-01 DIAGNOSIS — I10 Essential (primary) hypertension: Secondary | ICD-10-CM

## 2021-12-01 DIAGNOSIS — E1169 Type 2 diabetes mellitus with other specified complication: Secondary | ICD-10-CM | POA: Diagnosis not present

## 2021-12-01 LAB — BASIC METABOLIC PANEL
BUN: 20 mg/dL (ref 6–23)
CO2: 24 mEq/L (ref 19–32)
Calcium: 9.3 mg/dL (ref 8.4–10.5)
Chloride: 103 mEq/L (ref 96–112)
Creatinine, Ser: 0.91 mg/dL (ref 0.40–1.20)
GFR: 65.93 mL/min (ref >60.00)
Glucose, Bld: 168 mg/dL — ABNORMAL HIGH (ref 70–99)
Potassium: 4.1 mEq/L (ref 3.5–5.1)
Sodium: 138 mEq/L (ref 135–145)

## 2021-12-01 LAB — CBC WITH DIFFERENTIAL/PLATELET
Basophils Absolute: 0 10*3/uL (ref 0.0–0.1)
Basophils Relative: 0.6 % (ref 0.0–3.0)
Eosinophils Absolute: 0.1 10*3/uL (ref 0.0–0.7)
Eosinophils Relative: 2.5 % (ref 0.0–5.0)
HCT: 41 % (ref 36.0–46.0)
Hemoglobin: 13.8 g/dL (ref 12.0–15.0)
Lymphocytes Relative: 29.5 % (ref 12.0–46.0)
Lymphs Abs: 1.6 10*3/uL (ref 0.7–4.0)
MCHC: 33.7 g/dL (ref 30.0–36.0)
MCV: 91.5 fl (ref 78.0–100.0)
Monocytes Absolute: 0.7 10*3/uL (ref 0.1–1.0)
Monocytes Relative: 12.2 % — ABNORMAL HIGH (ref 3.0–12.0)
Neutro Abs: 2.9 10*3/uL (ref 1.4–7.7)
Neutrophils Relative %: 55.2 % (ref 43.0–77.0)
Platelets: 193 10*3/uL (ref 150.0–400.0)
RBC: 4.48 Mil/uL (ref 3.87–5.11)
RDW: 13.8 % (ref 11.5–15.5)
WBC: 5.3 10*3/uL (ref 4.0–10.5)

## 2021-12-01 LAB — TSH: TSH: 0.73 u[IU]/mL (ref 0.35–5.50)

## 2021-12-01 LAB — HEPATIC FUNCTION PANEL
ALT: 27 U/L (ref 0–35)
AST: 22 U/L (ref 0–37)
Albumin: 4.3 g/dL (ref 3.5–5.2)
Alkaline Phosphatase: 54 U/L (ref 39–117)
Bilirubin, Direct: 0.1 mg/dL (ref 0.0–0.3)
Total Bilirubin: 0.4 mg/dL (ref 0.2–1.2)
Total Protein: 7.2 g/dL (ref 6.0–8.3)

## 2021-12-01 LAB — LIPID PANEL
Cholesterol: 101 mg/dL (ref 0–200)
HDL: 41.6 mg/dL (ref >39.00)
LDL Cholesterol: 34 mg/dL (ref 0–99)
NonHDL: 59.7
Total CHOL/HDL Ratio: 2
Triglycerides: 127 mg/dL (ref 0.0–149.0)
VLDL: 25.4 mg/dL (ref 0.0–40.0)

## 2021-12-01 LAB — HEMOGLOBIN A1C: Hgb A1c MFr Bld: 7 % — ABNORMAL HIGH (ref 4.6–6.5)

## 2021-12-02 ENCOUNTER — Telehealth: Payer: Self-pay

## 2021-12-02 LAB — HIV ANTIBODY (ROUTINE TESTING W REFLEX): HIV 1&2 Ab, 4th Generation: NONREACTIVE

## 2021-12-02 LAB — RPR: RPR Ser Ql: NONREACTIVE

## 2021-12-02 LAB — URINE CYTOLOGY ANCILLARY ONLY
Chlamydia: NEGATIVE
Comment: NEGATIVE
Comment: NORMAL
Neisseria Gonorrhea: NEGATIVE

## 2021-12-02 LAB — HSV 2 ANTIBODY, IGG: HSV 2 Glycoprotein G Ab, IgG: <0.9 index

## 2021-12-02 NOTE — Telephone Encounter (Signed)
Lvm for pt to return call in regards to lab results.  Per Dr.Scott: Notify - a1c has improved.  (A1c 7.0).  continue low carb diet and exercise.  Currently on jardiance '10mg'$  q day.  I would like to increase to '25mg'$  q day. Will need new rx sent in.  Can take '10mg'$  (two per day) until current rx runs out and then get '25mg'$  dose.  Kidney function ok.  Cholesterol levels look good.  Hgb, thyroid test and liver function tests wnl.

## 2021-12-03 ENCOUNTER — Other Ambulatory Visit: Payer: Self-pay | Admitting: Internal Medicine

## 2021-12-03 MED ORDER — EMPAGLIFLOZIN 25 MG PO TABS: 25.0000 mg | ORAL_TABLET | Freq: Every day | ORAL | 3 refills | Status: DC

## 2021-12-03 NOTE — Progress Notes (Signed)
Rx sent in for jardiance.

## 2021-12-16 ENCOUNTER — Other Ambulatory Visit: Payer: Self-pay | Admitting: Internal Medicine

## 2021-12-17 ENCOUNTER — Encounter: Payer: Self-pay | Admitting: Internal Medicine

## 2021-12-17 NOTE — Telephone Encounter (Signed)
Please call pt and notify - I changed her from '10mg'$  of jardiance to '25mg'$  of jardiance.  I had informed her she could take 2 of the '10mg'$  tablets until those run out, but she is to only take one '25mg'$  tablet when gets new refill.  Rx has already been sent in for '25mg'$  of jardiance.  Let me know if any questions or confusion.

## 2021-12-22 DIAGNOSIS — H401133 Primary open-angle glaucoma, bilateral, severe stage: Secondary | ICD-10-CM | POA: Diagnosis not present

## 2021-12-25 ENCOUNTER — Ambulatory Visit: Payer: Medicare Other | Admitting: Cardiology

## 2021-12-29 ENCOUNTER — Ambulatory Visit
Admission: RE | Admit: 2021-12-29 | Discharge: 2021-12-29 | Disposition: A | Discharge status: Home / Self Care | Payer: Medicare Other | Source: Ambulatory Visit | Attending: Internal Medicine | Admitting: Internal Medicine

## 2021-12-29 DIAGNOSIS — Z1231 Encounter for screening mammogram for malignant neoplasm of breast: Secondary | ICD-10-CM | POA: Insufficient documentation

## 2022-01-08 ENCOUNTER — Ambulatory Visit: Payer: Medicare Other | Attending: Cardiology | Admitting: Internal Medicine

## 2022-01-08 ENCOUNTER — Encounter: Payer: Self-pay | Admitting: Internal Medicine

## 2022-01-08 VITALS — BP 111/69 | HR 68 | Ht 62.0 in | Wt 169.0 lb

## 2022-01-08 DIAGNOSIS — I480 Paroxysmal atrial fibrillation: Secondary | ICD-10-CM | POA: Diagnosis not present

## 2022-01-08 DIAGNOSIS — E1159 Type 2 diabetes mellitus with other circulatory complications: Secondary | ICD-10-CM | POA: Diagnosis not present

## 2022-01-08 DIAGNOSIS — R079 Chest pain, unspecified: Secondary | ICD-10-CM

## 2022-01-08 DIAGNOSIS — I152 Hypertension secondary to endocrine disorders: Secondary | ICD-10-CM

## 2022-01-08 NOTE — Patient Instructions (Signed)
Medication Instructions:  Your physician recommends that you continue on your current medications as directed. Please refer to the Current Medication list given to you today.  *If you need a refill on your cardiac medications before your next appointment, please call your pharmacy*   Lab Work: None ordered If you have labs (blood work) drawn today and your tests are completely normal, you will receive your results only by: [place] (if you have MyChart) OR A paper copy in the mail If you have any lab test that is abnormal or we need to change your treatment, we will call you to review the results.   Follow-Up: At [facility], you and your health needs are our priority.  As part of our continuing mission to provide you with exceptional heart care, we have created designated Provider Care Teams.  These Care Teams include your primary Cardiologist (physician) and Advanced Practice Providers (APPs -  Physician Assistants and Nurse Practitioners) who all work together to provide you with the care you need, when you need it.   Your next appointment:   1 year(s)  The format for your next appointment:   In Person  Provider:   You may see Nelva Bush, MD or one of the following Advanced Practice Providers on your designated Care Team:   Murray Hodgkins, NP Christell Faith, PA-C Cadence Kathlen Mody, PA-C Gerrie Nordmann, NP   Important Information About Sugar

## 2022-01-08 NOTE — Progress Notes (Signed)
Follow-up Outpatient Visit Date: 01/08/2022  Primary Care Provider: Einar Pheasant, [address] [id number]  Chief Complaint: Follow-up paroxysmal atrial fibrillation  HPI:  Ms. April Solis is a 66 y.o. female with history of paroxysmal atrial fibrillation, hypertension, hyperlipidemia, type 2 diabetes mellitus, glaucoma, and environmental allergies, who presents for follow-up of atrial fibrillation.  I last saw her in February, at which time she noted a single episode of chest pain that occurred while she was at a urology appointment with her husband in [place].  We agreed to obtain a coronary CTA, which was normal without evidence of CAD or other significant incidental findings.  Today, April Solis reports that she is feeling fairly well.  She is frustrated by a recent rise in her hemoglobin A1c, which was up to 7.2%.  This prompted escalation of empagliflozin to 25 mg daily.  She has been trying to cut out white rice and is hopeful that this will improve her glycemic control.  She has not had any further chest pain.  She also denies shortness of breath, palpitations, lightheadedness, and edema.  Her Apple Watch indicates an atrial fibrillation burden of approximately 10% during September and the first week of October.  --------------------------------------------------------------------------------------------------  Cardiovascular History & Procedures: Cardiovascular Problems: Paroxysmal atrial fibrillation Chest pain   April Solis Factors: Hypertension, hyperlipidemia, type 2 diabetes mellitus, obesity, and age greater than 22   Cath/PCI: None   CV Surgery: None   EP Procedures and Devices: None   Non-Invasive Evaluation(s): Coronary CTA (05/28/2021): Normal coronary arteries with coronary calcium score 0.  No significant extracardiac findings. TTE (03/12/2021): Normal LV size and wall thickness.  LVEF 60-65% with normal wall motion and diastolic  function.  GLS -20.5%.  Normal RV size and function.  Normal biatrial size.  Mild mitral valve thickening and mild mitral regurgitation.  Normal CVP.  Recent CV Pertinent Labs: Lab Results  Component Value Date   CHOL 101 12/01/2021   CHOL 150 12/26/2012   HDL 41.60 12/01/2021   HDL 40 12/26/2012   LDLCALC 34 12/01/2021   LDLCALC 84 12/26/2012   LDLDIRECT 100.0 12/07/2017   TRIG 127.0 12/01/2021   TRIG 132 12/26/2012   CHOLHDL 2 12/01/2021   K 4.1 12/01/2021   K 4.0 03/19/2013   BUN 20 12/01/2021   BUN 16 03/19/2013   CREATININE 0.91 12/01/2021   CREATININE 0.87 03/19/2013    Past medical and surgical history were reviewed and updated in EPIC.  Current Meds  Medication Sig   amLODipine (NORVASC) 5 MG tablet TAKE ONE TABLET EVERY DAY   dorzolamide-timolol (COSOPT) 22.3-6.8 MG/ML ophthalmic solution Place 1 drop into both eyes 2 (two) times daily.   doxycycline (ADOXA) 50 MG tablet Take 1-2 tabs PO QD PRN flares   ELIQUIS 5 MG TABS tablet TAKE ONE TABLET BY MOUTH TWICE DAILY   empagliflozin (JARDIANCE) 25 MG TABS tablet Take 1 tablet (25 mg total) by mouth daily before breakfast.   finasteride (PROSCAR) 5 MG tablet Take 1 tablet (5 mg total) by mouth daily.   Fluocinolone Acetonide Scalp 0.01 % OIL Apply to aa's scalp QD PRN flares.   GE100 BLOOD GLUCOSE TEST test strip TEST TWICE DAILY   ketoconazole (NIZORAL) 2 % shampoo Shampoo into the scalp let sit several minutes then wash out. Use 2-3 days per week.   lisinopril (ZESTRIL) 10 MG tablet TAKE 1 TABLET BY MOUTH DAILY   metFORMIN (GLUCOPHAGE-XR) 500 MG 24 hr tablet TAKE 2 TABLETS  BY MOUTH TWICE DAILY.   metoprolol tartrate (LOPRESSOR) 25 MG tablet Take 1 tablet (25 mg total) by mouth 2 (two) times daily.   minoxidil (LONITEN) 2.5 MG tablet Take 1 tablet (2.5 mg total) by mouth daily.   Multiple Vitamin (MULTIVITAMIN) tablet Take 1 tablet by mouth daily.   nystatin cream (MYCOSTATIN) Apply 1 application topically 2 (two) times  daily.   rosuvastatin (CRESTOR) 10 MG tablet TAKE ONE TABLET EVERY DAY   timolol (BETIMOL) 0.25 % ophthalmic solution 1-2 drops 2 (two) times daily.   triamcinolone cream (KENALOG) 0.1 % APPLY TO THE AFFECTED AREA DAILY AS NEEDED   vitamin E 200 UNIT capsule Take 200 Units by mouth daily.    Allergies: Influenza vaccines, Rubbing alcohol [alcohol], and Other  Social History   Tobacco Use   Smoking status: Never   Smokeless tobacco: Never  Vaping Use   Vaping Use: Never used  Substance Use Topics   Alcohol use: No    Alcohol/week: 0.0 standard drinks of alcohol   Drug use: No    Family History  Problem Relation Age of Onset   Colon polyps Mother    Alzheimer's disease Mother    Heart attack Father 41   Hypertension Father    Diabetes Father    Hypertension Sister    Diabetes Sister        x2   COPD Sister    Diabetes Sister    Hypertension Sister    Glaucoma Sister    Arrhythmia Son        Faster heart beat than normal   Breast cancer Neg Hx    Colon cancer Neg Hx     Review of Systems: A 12-system review of systems was performed and was negative except as noted in the HPI.  --------------------------------------------------------------------------------------------------  Physical Exam: BP 111/69   Pulse 68   Ht '5\' 2"'$  (1.575 m)   Wt 169 lb (76.7 kg)   LMP 02/26/2008   SpO2 98%   BMI 30.91 kg/m   General:  NAD. Neck: No JVD or HJR. Lungs: Clear to auscultation bilaterally without wheezes or crackles. Heart: Regular rate and rhythm without murmurs, rubs, or gallops. Abdomen: Soft, nontender, nondistended. Extremities: No lower extremity edema.  EKG: Normal sinus rhythm with possible left atrial enlargement.  No significant change from prior tracing on 05/20/2021.  Lab Results  Component Value Date   WBC 5.3 12/01/2021   HGB 13.8 12/01/2021   HCT 41.0 12/01/2021   MCV 91.5 12/01/2021   PLT 193.0 12/01/2021    Lab Results  Component Value Date    NA 138 12/01/2021   K 4.1 12/01/2021   CL 103 12/01/2021   CO2 24 12/01/2021   BUN 20 12/01/2021   CREATININE 0.91 12/01/2021   GLUCOSE 168 (H) 12/01/2021   ALT 27 12/01/2021    Lab Results  Component Value Date   CHOL 101 12/01/2021   HDL 41.60 12/01/2021   LDLCALC 34 12/01/2021   LDLDIRECT 100.0 12/07/2017   TRIG 127.0 12/01/2021   CHOLHDL 2 12/01/2021    --------------------------------------------------------------------------------------------------  ASSESSMENT AND PLAN: Paroxysmal atrial fibrillation: Ms. Hope Pigeon is in sinus rhythm today.  Her Apple Watch indicates approximately 10% atrial fibrillation burden, which is asymptomatic.  We will plan to continue indefinite anticoagulation with apixaban as well as rate control with metoprolol tartrate 25 mg twice daily.  Chest pain: No further episodes.  Coronary CTA after our last visit was reassuring with no evidence of CAD.  No  further intervention recommended at this time.  Hypertension associated with type 2 diabetes mellitus: Blood pressure well controlled today.  Continue current medications.  Ongoing management of diabetes per Dr. Nicki Reaper.  Follow-up: Return to clinic in 1 year.  Nelva Bush, MD 01/08/2022 2:58 PM

## 2022-01-26 ENCOUNTER — Ambulatory Visit: Payer: Medicare Other | Admitting: Dermatology

## 2022-01-26 DIAGNOSIS — L578 Other skin changes due to chronic exposure to nonionizing radiation: Secondary | ICD-10-CM

## 2022-01-26 DIAGNOSIS — L649 Androgenic alopecia, unspecified: Secondary | ICD-10-CM

## 2022-01-26 DIAGNOSIS — L739 Follicular disorder, unspecified: Secondary | ICD-10-CM

## 2022-01-26 DIAGNOSIS — L821 Other seborrheic keratosis: Secondary | ICD-10-CM

## 2022-01-26 DIAGNOSIS — D229 Melanocytic nevi, unspecified: Secondary | ICD-10-CM

## 2022-01-26 DIAGNOSIS — L814 Other melanin hyperpigmentation: Secondary | ICD-10-CM

## 2022-01-26 DIAGNOSIS — L720 Epidermal cyst: Secondary | ICD-10-CM

## 2022-01-26 DIAGNOSIS — I8393 Asymptomatic varicose veins of bilateral lower extremities: Secondary | ICD-10-CM

## 2022-01-26 DIAGNOSIS — Z1283 Encounter for screening for malignant neoplasm of skin: Secondary | ICD-10-CM

## 2022-01-26 NOTE — Patient Instructions (Addendum)
Seborrheic Keratosis  What causes seborrheic keratoses? Seborrheic keratoses are harmless, common skin growths that first appear during adult life.  As time goes by, more growths appear.  Some people may develop a large number of them.  Seborrheic keratoses appear on both covered and uncovered body parts.  They are not caused by sunlight.  The tendency to develop seborrheic keratoses can be inherited.  They vary in color from skin-colored to gray, brown, or even black.  They can be either smooth or have a rough, warty surface.   Seborrheic keratoses are superficial and look as if they were stuck on the skin.  Under the microscope this type of keratosis looks like layers upon layers of skin.  That is why at times the top layer may seem to fall off, but the rest of the growth remains and re-grows.    Treatment Seborrheic keratoses do not need to be treated, but can easily be removed in the office.  Seborrheic keratoses often cause symptoms when they rub on clothing or jewelry.  Lesions can be in the way of shaving.  If they become inflamed, they can cause itching, soreness, or burning.  Removal of a seborrheic keratosis can be accomplished by freezing, burning, or surgery. If any spot bleeds, scabs, or grows rapidly, please return to have it checked, as these can be an indication of a skin cancer.  Melanoma ABCDEs  Melanoma is the most dangerous type of skin cancer, and is the leading cause of death from skin disease.  You are more likely to develop melanoma if you: Have light-colored skin, light-colored eyes, or red or blond hair Spend a lot of time in the sun Tan regularly, either outdoors or in a tanning bed Have had blistering sunburns, especially during childhood Have a close family member who has had a melanoma Have atypical moles or large birthmarks  Early detection of melanoma is key since treatment is typically straightforward and cure rates are extremely high if we catch it early.   The  first sign of melanoma is often a change in a mole or a new dark spot.  The ABCDE system is a way of remembering the signs of melanoma.  A for asymmetry:  The two halves do not match. B for border:  The edges of the growth are irregular. C for color:  A mixture of colors are present instead of an even brown color. D for diameter:  Melanomas are usually (but not always) greater than 6mm - the size of a pencil eraser. E for evolution:  The spot keeps changing in size, shape, and color.  Please check your skin once per month between visits. You can use a small mirror in front and a large mirror behind you to keep an eye on the back side or your body.   If you see any new or changing lesions before your next follow-up, please call to schedule a visit.  Please continue daily skin protection including broad spectrum sunscreen SPF 30+ to sun-exposed areas, reapplying every 2 hours as needed when you're outdoors.    Due to recent changes in healthcare laws, you may see results of your pathology and/or laboratory studies on MyChart before the doctors have had a chance to review them. We understand that in some cases there may be results that are confusing or concerning to you. Please understand that not all results are received at the same time and often the doctors may need to interpret multiple results in order to   with the best plan of care or course of treatment. Therefore, we ask that you please give us 2 business days to thoroughly review all your results before contacting the office for clarification. Should we see a critical lab result, you will be contacted sooner.   If You Need Anything After Your Visit  If you have any questions or concerns for your doctor, please call our main line at [phone] and press option 4 to reach your doctor's medical assistant. If no one answers, please leave a voicemail as directed and we will return your call as soon as possible. Messages left after 4  pm will be answered the following business day.   You may also send us a message via MyChart. We typically respond to MyChart messages within 1-2 business days.  For prescription refills, please ask your pharmacy to contact our office. Our fax number is [phone].  If you have an urgent issue when the clinic is closed that cannot wait until the next business day, you can page your doctor at the number below.    Please note that while we do our best to be available for urgent issues outside of office hours, we are not available 24/7.   If you have an urgent issue and are unable to reach us, you may choose to seek medical care at your doctor's office, retail clinic, urgent care center, or emergency room.  If you have a medical emergency, please immediately call 911 or go to the emergency department.  Pager Numbers  - Dr. Kowalski: [phone]  - Dr. Moye: [phone]  - Dr. Stewart: [phone]  In the event of inclement weather, please call our main line at [phone] for an update on the status of any delays or closures.  Dermatology Medication Tips: Please keep the boxes that topical medications come in in order to help keep track of the instructions about where and how to use these. Pharmacies typically print the medication instructions only on the boxes and not directly on the medication tubes.   If your medication is too expensive, please contact our office at [phone] option 4 or send us a message through MyChart.   We are unable to tell what your co-pay for medications will be in advance as this is different depending on your insurance coverage. However, we may be able to find a substitute medication at lower cost or fill out paperwork to get insurance to cover a needed medication.   If a prior authorization is required to get your medication covered by your insurance company, please allow us 1-2 business days to complete this process.  Drug prices often vary  depending on where the prescription is filled and some pharmacies may offer cheaper prices.  The website www.goodrx.com contains coupons for medications through different pharmacies. The prices here do not account for what the cost may be with help from insurance (it may be cheaper with your insurance), but the website can give you the price if you did not use any insurance.  - You can print the associated coupon and take it with your prescription to the pharmacy.  - You may also stop by our office during regular business hours and pick up a GoodRx coupon card.  - If you need your prescription sent electronically to a different pharmacy, notify our office through [facility] MyChart or by phone at [phone] option 4.     Si Usted Necesita Algo Despus de Su Visita  Tambin puede enviarnos un mensaje a travs   de MyChart. Por lo general respondemos a los mensajes de MyChart en el transcurso de 1 a 2 das hbiles.  Para renovar recetas, por favor pida a su farmacia que se ponga en contacto con nuestra oficina. Nuestro nmero de fax es el [phone].  Si tiene un asunto urgente cuando la clnica est cerrada y que no puede esperar hasta el siguiente da hbil, puede llamar/localizar a su doctor(a) al nmero que aparece a continuacin.   Por favor, tenga en cuenta que aunque hacemos todo lo posible para estar disponibles para asuntos urgentes fuera del horario de oficina, no estamos disponibles las 24 horas del da, los 7 das de la semana.   Si tiene un problema urgente y no puede comunicarse con nosotros, puede optar por buscar atencin mdica  en el consultorio de su doctor(a), en una clnica privada, en un centro de atencin urgente o en una sala de emergencias.  Si tiene una emergencia mdica, por favor llame inmediatamente al 911 o vaya a la sala de emergencias.  Nmeros de bper  - Dr. Kowalski: [phone]  - Dra. Moye: [phone]  - Dra. Stewart: [phone]  En caso de  inclemencias del tiempo, por favor llame a nuestra lnea principal al [phone] para una actualizacin sobre el estado de cualquier retraso o cierre.  Consejos para la medicacin en dermatologa: Por favor, guarde las cajas en las que vienen los medicamentos de uso tpico para ayudarle a seguir las instrucciones sobre dnde y cmo usarlos. Las farmacias generalmente imprimen las instrucciones del medicamento slo en las cajas y no directamente en los tubos del medicamento.   Si su medicamento es muy caro, por favor, pngase en contacto con nuestra oficina llamando al [phone] y presione la opcin 4 o envenos un mensaje a travs de MyChart.   No podemos decirle cul ser su copago por los medicamentos por adelantado ya que esto es diferente dependiendo de la cobertura de su seguro. Sin embargo, es posible que podamos encontrar un medicamento sustituto a menor costo o llenar un formulario para que el seguro cubra el medicamento que se considera necesario.   Si se requiere una autorizacin previa para que su compaa de seguros cubra su medicamento, por favor permtanos de 1 a 2 das hbiles para completar este proceso.  Los precios de los medicamentos varan con frecuencia dependiendo del lugar de dnde se surte la receta y alguna farmacias pueden ofrecer precios ms baratos.  El sitio web www.goodrx.com tiene cupones para medicamentos de diferentes farmacias. Los precios aqu no tienen en cuenta lo que podra costar con la ayuda del seguro (puede ser ms barato con su seguro), pero el sitio web puede darle el precio si no utiliz ningn seguro.  - Puede imprimir el cupn correspondiente y llevarlo con su receta a la farmacia.  - Tambin puede pasar por nuestra oficina durante el horario de atencin regular y recoger una tarjeta de cupones de GoodRx.  - Si necesita que su receta se enve electrnicamente a una farmacia diferente, informe a nuestra oficina a travs de MyChart de [place] o  por telfono llamando al [phone] y presione la opcin 4.  

## 2022-01-26 NOTE — Progress Notes (Signed)
Follow-Up Visit   Subjective  April Solis is a 66 y.o. female who presents for the following: Annual Exam (No personal or fhx skin cancer. ), Alopecia (Patient currently taking finasteride 2.5 mg and minoxidil 1.25 mg daily. She has noticed some baby hairs. ), and Follow-up (For folliculitis. Patient taking doxycycline 50 mg as needed but is using ketoconazole shampoo once a week and fluocinolone oil once a week. ).  No side effects from medications.   The following portions of the chart were reviewed this encounter and updated as appropriate:       Review of Systems:  No other skin or systemic complaints except as noted in HPI or Assessment and Plan.  Objective  Well appearing patient in no apparent distress; mood and affect are within normal limits.  A full examination was performed including scalp, head, eyes, ears, nose, lips, neck, chest, axillae, abdomen, back, buttocks, bilateral upper extremities, bilateral lower extremities, hands, feet, fingers, toes, fingernails, and toenails. All findings within normal limits unless otherwise noted below.  Scalp Clear today  chest Smooth white papule(s).   Scalp Diffuse thinning of the crown and widening of the midline part with retention of the frontal hairline with improvement when compared to photos - Reviewed progressive nature and prognosis.     Assessment & Plan  Folliculitis Scalp  Chronic condition with duration or expected duration over one year. Currently well-controlled.   Continue ketoconazole 2% shampoo qwk as needed Continue fluocinolone oil qwk as needed Continue doxycycline 50 mg 1-2 times daily with food as needed for flares.   Doxycycline should be taken with food to prevent nausea. Do not lay down for 30 minutes after taking. Be cautious with sun exposure and use good sun protection while on this medication. Pregnant women should not take this medication.    Related Medications Fluocinolone  Acetonide Scalp 0.01 % OIL Apply to aa's scalp QD PRN flares.  ketoconazole (NIZORAL) 2 % shampoo Shampoo into the scalp let sit several minutes then wash out. Use 2-3 days per week.  doxycycline (ADOXA) 50 MG tablet Take 1-2 tabs PO QD PRN flares  Milia chest  Benign, observe.     Androgenetic alopecia Scalp  Chronic and persistent condition with duration or expected duration over one year. Condition is symptomatic/ bothersome to patient. Improving but not currently at goal.   Female Androgenic Alopecia is a chronic condition related to genetics and/or hormonal changes.  In women androgenetic alopecia is commonly associated with menopause but may occur any time after puberty.  It causes hair thinning primarily on the crown with widening of the part and temporal hairline recession.  Can use OTC Rogaine (minoxidil) 5% solution/foam as directed.  Oral treatments in female patients who have no contraindication may include : - Low dose oral minoxidil 1.25 - '5mg'$  daily - Spironolactone 50 - '100mg'$  bid - Finasteride 2.5 - 5 mg daily Adjunctive therapies include: - Low Level Laser Light Therapy (LLLT) - Platelet-rich plasma injections (PRP) - Hair Transplants or scalp reduction   BP 141/77  Continue finasteride 2.5 mg once daily Continue minoxidil 1.25 mg once daily  Doses of minoxidil for hair loss are considered 'low dose'. This is because the doses used for hair loss are a lot lower than the doses which are used for conditions such as high blood pressure (hypertension). The doses used for hypertension are 10-'40mg'$  per day.  Side effects are uncommon at the low doses (up to 2.5 mg/day) used to treat  hair loss. Potential side effects, more commonly seen at higher doses, include: Increase in hair growth (hypertrichosis) elsewhere on face and body Temporary hair shedding upon starting medication which may last up to 4 weeks Ankle swelling, fluid retention, rapid weight gain more than 5  pounds Low blood pressure and feeling lightheaded or dizzy when standing up quickly Fast or irregular heartbeat Headaches      Lentigines - Scattered tan macules - Due to sun exposure - Benign-appearing, observe - Recommend daily broad spectrum sunscreen SPF 30+ to sun-exposed areas, reapply every 2 hours as needed. - Call for any changes  Seborrheic Keratoses - Stuck-on, waxy, tan-brown papules and/or plaques  - Benign-appearing - Discussed benign etiology and prognosis. - Observe - Call for any changes  Melanocytic Nevi - Tan-brown and/or pink-flesh-colored symmetric macules and papules - Benign appearing on exam today - Observation - Call clinic for new or changing moles - Recommend daily use of broad spectrum spf 30+ sunscreen to sun-exposed areas.   Hemangiomas - Red papules - Discussed benign nature - Observe - Call for any changes  Actinic Damage - Chronic condition, secondary to cumulative UV/sun exposure - diffuse scaly erythematous macules with underlying dyspigmentation - Recommend daily broad spectrum sunscreen SPF 30+ to sun-exposed areas, reapply every 2 hours as needed.  - Staying in the shade or wearing long sleeves, sun glasses (UVA+UVB protection) and wide brim hats (4-inch brim around the entire circumference of the hat) are also recommended for sun protection.  - Call for new or changing lesions.  Skin cancer screening performed today.  Varicose Veins/Spider Veins - Dilated blue, purple or red veins at the lower extremities - Reassured - Smaller vessels can be treated by sclerotherapy (a procedure to inject a medicine into the veins to make them disappear) if desired, but the treatment is not covered by insurance. Larger vessels may be covered if symptomatic and we would refer to vascular surgeon if treatment desired.  Return in about 6 months (around 07/28/2022) for Alopecia.  Graciella Belton, RMA, am acting as scribe for Brendolyn Patty, MD  .  Documentation: I have reviewed the above documentation for accuracy and completeness, and I agree with the above.  Brendolyn Patty MD

## 2022-02-16 ENCOUNTER — Other Ambulatory Visit: Payer: Self-pay | Admitting: Dermatology

## 2022-02-16 DIAGNOSIS — L649 Androgenic alopecia, unspecified: Secondary | ICD-10-CM

## 2022-03-16 ENCOUNTER — Ambulatory Visit: Payer: Medicare Other | Admitting: Internal Medicine

## 2022-03-18 ENCOUNTER — Other Ambulatory Visit: Payer: Self-pay | Admitting: Internal Medicine

## 2022-03-24 ENCOUNTER — Encounter: Payer: Self-pay | Admitting: Internal Medicine

## 2022-03-24 ENCOUNTER — Ambulatory Visit (INDEPENDENT_AMBULATORY_CARE_PROVIDER_SITE_OTHER): Payer: Medicare Other | Admitting: Internal Medicine

## 2022-03-24 VITALS — BP 132/76 | HR 71 | Temp 98.3°F | Resp 15 | Ht 62.0 in | Wt 163.2 lb

## 2022-03-24 DIAGNOSIS — E1165 Type 2 diabetes mellitus with hyperglycemia: Secondary | ICD-10-CM

## 2022-03-24 DIAGNOSIS — Z1152 Encounter for screening for COVID-19: Secondary | ICD-10-CM

## 2022-03-24 DIAGNOSIS — E1159 Type 2 diabetes mellitus with other circulatory complications: Secondary | ICD-10-CM | POA: Diagnosis not present

## 2022-03-24 DIAGNOSIS — E785 Hyperlipidemia, unspecified: Secondary | ICD-10-CM

## 2022-03-24 DIAGNOSIS — I480 Paroxysmal atrial fibrillation: Secondary | ICD-10-CM

## 2022-03-24 DIAGNOSIS — I152 Hypertension secondary to endocrine disorders: Secondary | ICD-10-CM

## 2022-03-24 DIAGNOSIS — F439 Reaction to severe stress, unspecified: Secondary | ICD-10-CM

## 2022-03-24 DIAGNOSIS — E1169 Type 2 diabetes mellitus with other specified complication: Secondary | ICD-10-CM

## 2022-03-24 DIAGNOSIS — R059 Cough, unspecified: Secondary | ICD-10-CM

## 2022-03-24 MED ORDER — BENZONATATE 100 MG PO CAPS: 100.0000 mg | ORAL_CAPSULE | Freq: Three times a day (TID) | ORAL | 0 refills | Status: DC | PRN

## 2022-03-24 MED ORDER — CEFDINIR 300 MG PO CAPS: 300.0000 mg | ORAL_CAPSULE | Freq: Two times a day (BID) | ORAL | 0 refills | Status: DC

## 2022-03-24 MED ORDER — PREDNISONE 10 MG PO TABS: ORAL_TABLET | ORAL | 0 refills | Status: DC

## 2022-03-24 NOTE — Progress Notes (Signed)
[name], female   DOB: 1955-05-17, 66 y.o.   MRN: [id number]   Subjective:    [name], female    DOB: [birth year], 66 y.o.   MRN: [id number]  Patient here for  Chief Complaint  Patient presents with   Cough    HPI Scheduled to follow up regarding her blood sugar, blood pressure and cholesterol.  Reports increased cough and congestion.  Symptoms started one week ago - increased sinus congestion.  When lying down feels some chest discomfort and more difficulty breathing.  Head feels full.  Increased mucus production.  Yellow mucus in am and as day progresses - clear mucus.  Tmax 99.4.  increased sinus pressure.  No sore throat.  Ears popping.  Increased cough - dry cough.  Wheezing in am.  Blood sugars 120-150.  Husband has been sick for the last four weeks - with increased cough and congestion.  No abdominal pain or bowel change reported.    Past Medical History:  Diagnosis Date   Achilles rupture, right    Diabetes mellitus without complication ([place])    Environmental allergies    Glaucoma    History of chicken pox    Hyperlipidemia    Hypertension    Past Surgical History:  Procedure Laterality Date   BREAST BIOPSY Right 1999   neg   COLONOSCOPY WITH PROPOFOL N/A 12/12/2017   Procedure: COLONOSCOPY WITH PROPOFOL;  Surgeon: Manya Silvas, MD;  Location: [facility] ENDOSCOPY;  Service: Endoscopy;  Laterality: N/A;   Family History  Problem Relation Age of Onset   Colon polyps Mother    Alzheimer's disease Mother    Heart attack Father 45   Hypertension Father    Diabetes Father    Hypertension Sister    Diabetes Sister        x2   COPD Sister    Diabetes Sister    Hypertension Sister    Glaucoma Sister    Arrhythmia Son        Faster heart beat than normal   Breast cancer Neg Hx    Colon cancer Neg Hx    Social History   Socioeconomic History   Marital status: Married    Spouse name: Not on file   Number of  children: 3   Years of education: Not on file   Highest education level: Not on file  Occupational History    Employer: [employer]  Tobacco Use   Smoking status: Never   Smokeless tobacco: Never  Vaping Use   Vaping Use: Never used  Substance and Sexual Activity   Alcohol use: No    Alcohol/week: 0.0 standard drinks of alcohol   Drug use: No   Sexual activity: Not on file  Other Topics Concern   Not on file  Social History Narrative   Not on file   Social Determinants of Health   Financial Resource Strain: Low Risk  (10/08/2021)   Overall Financial Resource Strain (CARDIA)    Difficulty of Paying Living Expenses: Not hard at all  Food Insecurity: No Food Insecurity (10/08/2021)   Hunger Vital Sign    Worried About Running Out of Food in the Last Year: Never true    Ran Out of Food in the Last Year: Never true  Transportation Needs: No Transportation Needs (10/08/2021)   PRAPARE - Hydrologist (Medical): No    Lack of Transportation (Non-Medical): No  Physical Activity: Sufficiently Active (10/08/2021)  Exercise Vital Sign    Days of Exercise per Week: 5 days    Minutes of Exercise per Session: 30 min  Stress: No Stress Concern Present (10/08/2021)   [place]    Feeling of Stress : Not at all  Social Connections: Unknown (10/08/2021)   Social Connection and Isolation Panel [NHANES]    Frequency of Communication with Friends and Family: More than three times a week    Frequency of Social Gatherings with Friends and Family: Not on file    Attends Religious Services: Not on file    Active Member of Clubs or Organizations: Not on file    Attends Archivist Meetings: Not on file    Marital Status: Not on file     Review of Systems  Constitutional:  Negative for unexpected weight change.       Tmax 99.4.   HENT:  Positive for congestion and sinus pressure.   Respiratory:  Positive  for cough.        Chest discomfort and breathing as outlined.   Cardiovascular:  Negative for palpitations and leg swelling.  Gastrointestinal:  Negative for abdominal pain, diarrhea, nausea and vomiting.  Genitourinary:  Negative for difficulty urinating and dysuria.  Musculoskeletal:  Negative for joint swelling and myalgias.  Skin:  Negative for color change and rash.  Neurological:  Negative for dizziness, light-headedness and headaches.  Psychiatric/Behavioral:  Negative for agitation and dysphoric mood.        Objective:     BP 132/76 (BP Location: Left Arm, Patient Position: Sitting, Cuff Size: Small)   Pulse 71   Temp 98.3 F (36.8 C) (Temporal)   Resp 15   Ht _0  (1.575 m)   Wt 163 lb 3.2 oz (74 kg)   LMP 02/26/2008   SpO2 97%   BMI 29.85 kg/m  Wt Readings from Last 3 Encounters:  03/24/22 163 lb 3.2 oz (74 kg)  01/08/22 169 lb (76.7 kg)  11/13/21 169 lb (76.7 kg)    Physical Exam Vitals reviewed.  Constitutional:      General: She is not in acute distress.    Appearance: Normal appearance.  HENT:     Head: Normocephalic and atraumatic.     Right Ear: External ear normal.     Left Ear: External ear normal.     Mouth/Throat:     Pharynx: No oropharyngeal exudate or posterior oropharyngeal erythema.  Eyes:     General: No scleral icterus.       Right eye: No discharge.        Left eye: No discharge.     Conjunctiva/sclera: Conjunctivae normal.  Neck:     Thyroid: No thyromegaly.  Cardiovascular:     Rate and Rhythm: Normal rate and regular rhythm.  Pulmonary:     Effort: No respiratory distress.     Breath sounds: Normal breath sounds. No wheezing.  Abdominal:     General: Bowel sounds are normal.     Palpations: Abdomen is soft.     Tenderness: There is no abdominal tenderness.  Musculoskeletal:        General: No swelling or tenderness.     Cervical back: Neck supple. No tenderness.  Lymphadenopathy:     Cervical: No cervical adenopathy.   Skin:    Findings: No erythema or rash.  Neurological:     Mental Status: She is alert.  Psychiatric:        Mood and  Affect: Mood normal.        Behavior: Behavior normal.      Outpatient Encounter Medications as of 03/24/2022  Medication Sig   amLODipine (NORVASC) 5 MG tablet TAKE ONE TABLET EVERY DAY   benzonatate (TESSALON PERLES) 100 MG capsule Take 1 capsule (100 mg total) by mouth 3 (three) times daily as needed for cough.   cefdinir (OMNICEF) 300 MG capsule Take 1 capsule (300 mg total) by mouth 2 (two) times daily.   dorzolamide-timolol (COSOPT) 22.3-6.8 MG/ML ophthalmic solution Place 1 drop into both eyes 2 (two) times daily.   doxycycline (ADOXA) 50 MG tablet Take 1-2 tabs PO QD PRN flares   ELIQUIS 5 MG TABS tablet TAKE ONE TABLET BY MOUTH TWICE DAILY   finasteride (PROSCAR) 5 MG tablet TAKE 1 TABLET BY MOUTH DAILY   Fluocinolone Acetonide Scalp 0.01 % OIL Apply to aa's scalp QD PRN flares.   GE100 BLOOD GLUCOSE TEST test strip TEST TWICE DAILY   JARDIANCE 25 MG TABS tablet TAKE 1 TABLET BY MOUTH ONCE DAILY BEFOREBREAKFAST   ketoconazole (NIZORAL) 2 % shampoo Shampoo into the scalp let sit several minutes then wash out. Use 2-3 days per week.   lisinopril (ZESTRIL) 10 MG tablet TAKE 1 TABLET BY MOUTH DAILY   metFORMIN (GLUCOPHAGE-XR) 500 MG 24 hr tablet TAKE 2 TABLETS BY MOUTH TWICE DAILY.   metoprolol tartrate (LOPRESSOR) 25 MG tablet Take 1 tablet (25 mg total) by mouth 2 (two) times daily.   minoxidil (LONITEN) 2.5 MG tablet TAKE 1 TABLET BY MOUTH DAILY   Multiple Vitamin (MULTIVITAMIN) tablet Take 1 tablet by mouth daily.   nystatin cream (MYCOSTATIN) Apply 1 application topically 2 (two) times daily.   predniSONE (DELTASONE) 10 MG tablet Take 4 tablets x 1 day and then decrease by 1/2 tablet per day until down to zero mg.   rosuvastatin (CRESTOR) 10 MG tablet TAKE ONE TABLET EVERY DAY   timolol (BETIMOL) 0.25 % ophthalmic solution 1-2 drops 2 (two) times daily.    triamcinolone cream (KENALOG) 0.1 % APPLY TO THE AFFECTED AREA DAILY AS NEEDED   vitamin E 200 UNIT capsule Take 200 Units by mouth daily.   No facility-administered encounter medications on file as of 03/24/2022.     Lab Results  Component Value Date   WBC 5.3 12/01/2021   HGB 13.8 12/01/2021   HCT 41.0 12/01/2021   PLT 193.0 12/01/2021   GLUCOSE 168 (H) 12/01/2021   CHOL 101 12/01/2021   TRIG 127.0 12/01/2021   HDL 41.60 12/01/2021   LDLDIRECT 100.0 12/07/2017   LDLCALC 34 12/01/2021   ALT 27 12/01/2021   AST 22 12/01/2021   NA 138 12/01/2021   K 4.1 12/01/2021   CL 103 12/01/2021   CREATININE 0.91 12/01/2021   BUN 20 12/01/2021   CO2 24 12/01/2021   TSH 0.73 12/01/2021   HGBA1C 7.0 (H) 12/01/2021   MICROALBUR 1.9 11/28/2020    MM 3D SCREEN BREAST BILATERAL  Result Date: 12/29/2021 CLINICAL DATA:  Screening. EXAM: DIGITAL SCREENING BILATERAL MAMMOGRAM WITH TOMOSYNTHESIS AND CAD TECHNIQUE: Bilateral screening digital craniocaudal and mediolateral oblique mammograms were obtained. Bilateral screening digital breast tomosynthesis was performed. The images were evaluated with computer-aided detection. COMPARISON:  Previous exam(s). ACR Breast Density Category c: The breast tissue is heterogeneously dense, which may obscure small masses. FINDINGS: There are no findings suspicious for malignancy. IMPRESSION: No mammographic evidence of malignancy. A result letter of this screening mammogram will be mailed directly to the patient. RECOMMENDATION:  Screening mammogram in one year. (Code:SM-B-01Y) BI-RADS CATEGORY  1: Negative. Electronically Signed   By: Dorise Bullion III M.D.   On: 12/29/2021 17:08       Assessment & Plan:  Encounter for screening for COVID-19 -     COVID-19, Flu A+B and RSV  Type 2 diabetes mellitus with hyperglycemia, without long-term current use of insulin (HCC) Assessment & Plan: Low carb diet and exercise. Last a1c 7.0  improved from previous check.  On  jardiance. Have discussed diet and exercise.  Loose stool resolved on metformin XR.  Follow met b and a1c.  Follow.    Hyperlipidemia associated with type 2 diabetes mellitus ([place]) Assessment & Plan: On crestor.  Low cholesterol diet and exercise. Follow lipid panel and liver function tests.   Lab Results  Component Value Date   CHOL 101 12/01/2021   HDL 41.60 12/01/2021   LDLCALC 34 12/01/2021   LDLDIRECT 100.0 12/07/2017   TRIG 127.0 12/01/2021   CHOLHDL 2 12/01/2021     Hypertension associated with type 2 diabetes mellitus ([place]) Assessment & Plan: Blood pressure as outlined.  Currently on lopressor, amlodipine and lisinopril.  Follow pressures.  Follow metabolic panel.    PAF (paroxysmal atrial fibrillation) ([place]) Assessment & Plan: On eliquis.  Continue metoprlol.  No chest pain or sob reported.  Rate controlled today.  Follow.    Stress Assessment & Plan: Overall appears to be handling stress relatively well.  Follow.    Cough, unspecified type Assessment & Plan: Increased cough and congestion as outlined.  Symptoms present x 1 week.  Husband has been sick.  Discussed possible etiologies.  Given persistent increased cough and symptoms as outlined, treat with prednisone taper as directed.  Will need to follow sugars while on prednisone.  Stay hydrated.  Omnicef as directed.  Saline nasal spray, steroid nasal spray and mucinex.  Nasal swab for flu, covid and RSV .  Follow.  Call with update.    Other orders -     predniSONE; Take 4 tablets x 1 day and then decrease by 1/2 tablet per day until down to zero mg.  Dispense: 18 tablet; Refill: 0 -     Cefdinir; Take 1 capsule (300 mg total) by mouth 2 (two) times daily.  Dispense: 20 capsule; Refill: 0 -     Benzonatate; Take 1 capsule (100 mg total) by mouth 3 (three) times daily as needed for cough.  Dispense: 21 capsule; Refill: 0     Einar Pheasant, MD

## 2022-03-25 ENCOUNTER — Telehealth: Payer: Self-pay

## 2022-03-25 LAB — COVID-19, FLU A+B AND RSV
Influenza A, NAA: NOT DETECTED
Influenza B, NAA: NOT DETECTED
RSV, NAA: NOT DETECTED
SARS-CoV-2, NAA: DETECTED — AB

## 2022-03-25 NOTE — Telephone Encounter (Signed)
  LM for pt to cb     Notify - swab is positive for covid.  Please confirm how she is doing.

## 2022-03-26 NOTE — Telephone Encounter (Signed)
Patient left message with Access :Nurse returning Marisa's call.

## 2022-03-26 NOTE — Telephone Encounter (Signed)
SEE RESULT NOTE 

## 2022-04-02 ENCOUNTER — Encounter: Payer: Self-pay | Admitting: Internal Medicine

## 2022-04-02 DIAGNOSIS — R059 Cough, unspecified: Secondary | ICD-10-CM | POA: Insufficient documentation

## 2022-04-02 NOTE — Assessment & Plan Note (Signed)
On eliquis.  Continue metoprlol.  No chest pain or sob reported.  Rate controlled today.  Follow.

## 2022-04-02 NOTE — Assessment & Plan Note (Signed)
On crestor.  Low cholesterol diet and exercise. Follow lipid panel and liver function tests.   Lab Results  Component Value Date   CHOL 101 12/01/2021   HDL 41.60 12/01/2021   LDLCALC 34 12/01/2021   LDLDIRECT 100.0 12/07/2017   TRIG 127.0 12/01/2021   CHOLHDL 2 12/01/2021

## 2022-04-02 NOTE — Assessment & Plan Note (Signed)
Low carb diet and exercise. Last a1c 7.0  improved from previous check.  On jardiance. Have discussed diet and exercise.  Loose stool resolved on metformin XR.  Follow met b and a1c.  Follow.

## 2022-04-02 NOTE — Assessment & Plan Note (Signed)
Blood pressure as outlined.  Currently on lopressor, amlodipine and lisinopril.  Follow pressures.  Follow metabolic panel.

## 2022-04-02 NOTE — Assessment & Plan Note (Signed)
Increased cough and congestion as outlined.  Symptoms present x 1 week.  Husband has been sick.  Discussed possible etiologies.  Given persistent increased cough and symptoms as outlined, treat with prednisone taper as directed.  Will need to follow sugars while on prednisone.  Stay hydrated.  Omnicef as directed.  Saline nasal spray, steroid nasal spray and mucinex.  Nasal swab for flu, covid and RSV .  Follow.  Call with update.

## 2022-04-02 NOTE — Assessment & Plan Note (Addendum)
Overall appears to be handling stress relatively well.  Follow.

## 2022-04-16 ENCOUNTER — Other Ambulatory Visit (INDEPENDENT_AMBULATORY_CARE_PROVIDER_SITE_OTHER): Payer: Medicare Other

## 2022-04-16 DIAGNOSIS — Z114 Encounter for screening for human immunodeficiency virus [HIV]: Secondary | ICD-10-CM | POA: Diagnosis not present

## 2022-04-19 ENCOUNTER — Other Ambulatory Visit: Payer: Self-pay | Admitting: Internal Medicine

## 2022-04-19 LAB — HIV ANTIBODY (ROUTINE TESTING W REFLEX): HIV 1&2 Ab, 4th Generation: NONREACTIVE

## 2022-04-20 ENCOUNTER — Other Ambulatory Visit: Payer: Self-pay | Admitting: Internal Medicine

## 2022-04-20 DIAGNOSIS — E1169 Type 2 diabetes mellitus with other specified complication: Secondary | ICD-10-CM

## 2022-04-20 DIAGNOSIS — E1165 Type 2 diabetes mellitus with hyperglycemia: Secondary | ICD-10-CM

## 2022-04-20 DIAGNOSIS — E1159 Type 2 diabetes mellitus with other circulatory complications: Secondary | ICD-10-CM

## 2022-04-20 NOTE — Addendum Note (Signed)
Addended by: Leeanne Rio on: 04/20/2022 09:05 AM   Modules accepted: Orders

## 2022-04-20 NOTE — Progress Notes (Signed)
Orders placed for add on labs.  

## 2022-04-20 NOTE — Addendum Note (Signed)
Addended by: Leeanne Rio on: 04/20/2022 09:10 AM   Modules accepted: Orders

## 2022-04-28 ENCOUNTER — Other Ambulatory Visit (INDEPENDENT_AMBULATORY_CARE_PROVIDER_SITE_OTHER): Payer: Medicare Other

## 2022-04-28 ENCOUNTER — Other Ambulatory Visit: Payer: Self-pay | Admitting: Internal Medicine

## 2022-04-28 DIAGNOSIS — E1159 Type 2 diabetes mellitus with other circulatory complications: Secondary | ICD-10-CM

## 2022-04-28 DIAGNOSIS — E785 Hyperlipidemia, unspecified: Secondary | ICD-10-CM

## 2022-04-28 DIAGNOSIS — E1169 Type 2 diabetes mellitus with other specified complication: Secondary | ICD-10-CM | POA: Diagnosis not present

## 2022-04-28 DIAGNOSIS — I152 Hypertension secondary to endocrine disorders: Secondary | ICD-10-CM

## 2022-04-28 DIAGNOSIS — E1165 Type 2 diabetes mellitus with hyperglycemia: Secondary | ICD-10-CM

## 2022-04-28 LAB — LIPID PANEL
Cholesterol: 134 mg/dL (ref 0–200)
HDL: 43.8 mg/dL (ref >39.00)
NonHDL: 90.36
Total CHOL/HDL Ratio: 3
Triglycerides: 201 mg/dL — ABNORMAL HIGH (ref 0.0–149.0)
VLDL: 40.2 mg/dL — ABNORMAL HIGH (ref 0.0–40.0)

## 2022-04-28 LAB — BASIC METABOLIC PANEL
BUN: 15 mg/dL (ref 6–23)
CO2: 26 mEq/L (ref 19–32)
Calcium: 9 mg/dL (ref 8.4–10.5)
Chloride: 104 mEq/L (ref 96–112)
Creatinine, Ser: 0.77 mg/dL (ref 0.40–1.20)
GFR: 80.33 mL/min (ref >60.00)
Glucose, Bld: 156 mg/dL — ABNORMAL HIGH (ref 70–99)
Potassium: 4.2 mEq/L (ref 3.5–5.1)
Sodium: 139 mEq/L (ref 135–145)

## 2022-04-28 LAB — HEPATIC FUNCTION PANEL
ALT: 30 U/L (ref 0–35)
AST: 26 U/L (ref 0–37)
Albumin: 4.3 g/dL (ref 3.5–5.2)
Alkaline Phosphatase: 61 U/L (ref 39–117)
Bilirubin, Direct: 0.1 mg/dL (ref 0.0–0.3)
Total Bilirubin: 0.6 mg/dL (ref 0.2–1.2)
Total Protein: 7 g/dL (ref 6.0–8.3)

## 2022-04-28 LAB — LDL CHOLESTEROL, DIRECT: Direct LDL: 63 mg/dL

## 2022-04-28 LAB — HEMOGLOBIN A1C: Hgb A1c MFr Bld: 7.2 % — ABNORMAL HIGH (ref 4.6–6.5)

## 2022-05-04 ENCOUNTER — Other Ambulatory Visit: Payer: Self-pay

## 2022-05-04 DIAGNOSIS — E1165 Type 2 diabetes mellitus with hyperglycemia: Secondary | ICD-10-CM

## 2022-05-05 ENCOUNTER — Telehealth: Payer: Self-pay

## 2022-05-05 ENCOUNTER — Other Ambulatory Visit (INDEPENDENT_AMBULATORY_CARE_PROVIDER_SITE_OTHER): Payer: Medicare Other | Admitting: Pharmacist

## 2022-05-05 DIAGNOSIS — E1165 Type 2 diabetes mellitus with hyperglycemia: Secondary | ICD-10-CM

## 2022-05-05 NOTE — Progress Notes (Signed)
05/07/2022 Name: April Solis MRN: [id number] DOB: 03-07-1956   April Solis is a 67 y.o. year old female who presented for a telephone visit.   They were referred to the pharmacist by their PCP for assistance in managing diabetes and medication access.   Subjective:  Care Team: Primary Care Provider: Einar Pheasant, MD ; Next Scheduled Visit: 06/23/22  Medication Access/Adherence  Current Pharmacy:  [facility], [place] - [address] [place] [address] Phone: [phone] Fax: [phone]   Patient reports affordability concerns with their medications:  Patient states no cost issues until hits donut hole around August through rest of the year, Jardiance & Eliquis are main cost concerns. - Jardiance $40/month, Eliquis $60/monthly. $132 jardiance, and $165 per month in donut hole.   Patient reports access/transportation concerns to their pharmacy: No  Patient reports adherence concerns with their medications:  No     Diabetes: dx 5-6 yr ago  Current medications: metformin XR '500mg'$  2 tablets BID,jardiance '25mg'$  daily. Patient states "her sugars have not come down since starting jardiance, as well as increased dose of it"  Current glucose readings: 140-190s fasting, will occasional spot check postprandial, 230 Using traditional meter; testing one time daily, fasting  Patient denies hypoglycemic s/sx including dizziness, shakiness, sweating. Patient reports hyperglycemic symptoms including polyuria, polydipsia, polyphagia, nocturia, neuropathy, blurred vision.  Current meal patterns:  - Breakfast: egg, meat (sausage, ham, or bacon), sometimes bread, coffee (sugar-free creamer) - Lunch: sandwich (white-loaf bread) or salad  - Supper: meat, salad, + veggie. Occasional starch but not often - Snacks: not a snacker - Drinks: water, coffee in AM, decaf/unsweet tea  Current physical activity: 76mles/day walking  Assessed  for SGLT2i side effects: patient reports UTI in November.   Current medication access support: none   Objective:  Lab Results  Component Value Date   HGBA1C 7.2 (H) 04/28/2022    Lab Results  Component Value Date   CREATININE 0.77 04/28/2022   BUN 15 04/28/2022   NA 139 04/28/2022   K 4.2 04/28/2022   CL 104 04/28/2022   CO2 26 04/28/2022    Lab Results  Component Value Date   CHOL 134 04/28/2022   HDL 43.80 04/28/2022   LDLCALC 34 12/01/2021   LDLDIRECT 63.0 04/28/2022   TRIG 201.0 (H) 04/28/2022   CHOLHDL 3 04/28/2022    Medications Reviewed Today     Reviewed by KDarius Bump RNorge(Pharmacist) on 05/05/22 at 1130  Med List Status: <None>   Medication Order Taking? Sig Documenting Provider Last Dose Status Informant  amLODipine (NORVASC) 5 MG tablet [id number] TAKE ONE TABLET EVERY DAY SEinar Pheasant MD Taking Active   dorzolamide-timolol (COSOPT) 22.3-6.8 MG/ML ophthalmic solution [id number] Place 1 drop into both eyes 2 (two) times daily. [provider] Taking Active            [facility] Note ([facility]MJose PersiaNov 3, 2022  9:31 AM)    doxycycline (ADOXA) 50 MG tablet [id number] Take 1-2 tabs PO QD PRN flares SBrendolyn Patty MD Taking Active   ELIQUIS 5 MG TABS tablet [id number] TAKE ONE TABLET BY MOUTH TWICE DAILY SEinar Pheasant MD Taking Active   finasteride (PROSCAR) 5 MG tablet [id number] TAKE 1 TABLET BY MOUTH DAILY KRalene Bathe MD Taking Active   Fluocinolone Acetonide Scalp 0.01 % OIL [id number] Apply to aa's scalp QD PRN flares. SBrendolyn Patty MD  Taking Active   GE100 BLOOD GLUCOSE TEST test strip [id number] Yes TEST TWICE DAILY Einar Pheasant, MD Taking Active   JARDIANCE 25 MG TABS tablet [id number] Yes TAKE 1 TABLET BY MOUTH ONCE DAILY Carolann Littler, MD Taking Active   ketoconazole (NIZORAL) 2 % shampoo [id number] Yes Shampoo into the scalp let sit several minutes then wash out. Use 2-3 days  per week. Brendolyn Patty, MD Taking Active   lisinopril (ZESTRIL) 10 MG tablet [id number] Yes TAKE 1 TABLET BY MOUTH DAILY Einar Pheasant, MD Taking Active   metFORMIN (GLUCOPHAGE-XR) 500 MG 24 hr tablet [id number] Yes TAKE 2 TABLETS BY MOUTH TWICE DAILY. Einar Pheasant, MD Taking Active   metoprolol tartrate (LOPRESSOR) 25 MG tablet [id number] Yes TAKE 1 TABLET BY MOUTH 2 TIMES DAILY. Einar Pheasant, MD Taking Active   minoxidil (LONITEN) 2.5 MG tablet [id number] Yes TAKE 1 TABLET BY MOUTH DAILY Ralene Bathe, MD Taking Active   Multiple Vitamin (MULTIVITAMIN) tablet [id number] Yes Take 1 tablet by mouth daily. [provider] Taking Active   nystatin cream (MYCOSTATIN) [id number] Yes Apply 1 application topically 2 (two) times daily. Einar Pheasant, MD Taking Active   rosuvastatin (CRESTOR) 10 MG tablet [id number] Yes TAKE ONE TABLET EVERY DAY Einar Pheasant, MD Taking Active   timolol (BETIMOL) 0.25 % ophthalmic solution [id number] Yes Place 1-2 drops into both eyes daily. [provider] Taking Active   triamcinolone cream (KENALOG) 0.1 % [id number] Yes APPLY TO THE AFFECTED AREA DAILY AS NEEDED Einar Pheasant, MD Taking Active   vitamin E 200 UNIT capsule [id number] Yes Take 200 Units by mouth daily. [provider] Taking Active               Assessment/Plan:   Diabetes: - Currently uncontrolled - Reviewed long term cardiovascular and renal outcomes of uncontrolled blood sugar - Reviewed goal A1c, goal fasting, and goal 2 hour post prandial glucose - Reviewed dietary modifications including carbohydrates in moderation, well-rounded diet including vegetables, and discussed glycemic index of fruits - Recommend to initiate ozempic for CV risk reduction & obtaining glycemic control. May eventually consider to discontinue jardiance if there is a goal to reduce medication burden & cost, but for now reviewed renal and cardiac benefits of jardiance, patient is  amenable to continue for now until ozempic is on board. - Recommend to check glucose once daily, occasional postprandial checks - Meets financial criteria for ozempic patient assistance program through [employer]. Will collaborate with provider, CPhT, and patient to pursue assistance.     Follow Up Plan: telephone visit approximately 1 month  Larinda Buttery, PharmD Clinical Pharmacist [facility] [phone]

## 2022-05-05 NOTE — Progress Notes (Signed)
   Care Guide Note  05/05/2022 Name: Kiyani Jernigan MRN: [id number] DOB: 29-Aug-1955  Referred by: Einar Pheasant, MD Reason for referral : Care Coordination (Outreach to schedule with pharm d )   April Solis is a 67 y.o. year old female who is a primary care patient of Einar Pheasant, MD. Athalia Setterlund was referred to the pharmacist for assistance related to DM.    Successful contact was made with the patient to discuss pharmacy services including being ready for the pharmacist to call at least 5 minutes before the scheduled appointment time, to have medication bottles and any blood sugar or blood pressure readings ready for review. The patient agreed to meet with the pharmacist via with the pharmacist via telephone visit on (date/time).  05/05/2022  Noreene Larsson, [place], [place] [address] Direct Dial: [phone] [email or web address]

## 2022-05-07 NOTE — Patient Instructions (Signed)
April Solis,  It was a pleasure chatting with you!  Be on the lookout for paperwork to be sent to begin ozempic, as we discussed.  I also forwarded this plan to your primary doctor so she is aware and we are all working together as a team.  I will reach out in about a month to see where we stand with paperwork and blood sugars.  Take care, Luana Shu, PharmD Clinical Pharmacist [facility] [phone]

## 2022-05-08 NOTE — Progress Notes (Signed)
Thank you.  April Solis

## 2022-05-12 ENCOUNTER — Other Ambulatory Visit (HOSPITAL_COMMUNITY): Payer: Self-pay

## 2022-05-17 ENCOUNTER — Other Ambulatory Visit (HOSPITAL_COMMUNITY): Payer: Self-pay

## 2022-05-20 ENCOUNTER — Other Ambulatory Visit: Payer: Self-pay | Admitting: Dermatology

## 2022-05-20 DIAGNOSIS — L649 Androgenic alopecia, unspecified: Secondary | ICD-10-CM

## 2022-06-07 ENCOUNTER — Other Ambulatory Visit (HOSPITAL_COMMUNITY): Payer: Self-pay

## 2022-06-08 DIAGNOSIS — K080 Exfoliation of teeth due to systemic causes: Secondary | ICD-10-CM | POA: Diagnosis not present

## 2022-06-15 ENCOUNTER — Telehealth: Payer: Self-pay

## 2022-06-15 NOTE — Telephone Encounter (Signed)
Pt app re-mailed 06/15/2022

## 2022-06-16 DIAGNOSIS — K080 Exfoliation of teeth due to systemic causes: Secondary | ICD-10-CM | POA: Diagnosis not present

## 2022-06-17 ENCOUNTER — Other Ambulatory Visit: Payer: Self-pay | Admitting: Internal Medicine

## 2022-06-22 DIAGNOSIS — H52223 Regular astigmatism, bilateral: Secondary | ICD-10-CM | POA: Diagnosis not present

## 2022-06-22 DIAGNOSIS — H524 Presbyopia: Secondary | ICD-10-CM | POA: Diagnosis not present

## 2022-06-22 DIAGNOSIS — E119 Type 2 diabetes mellitus without complications: Secondary | ICD-10-CM | POA: Diagnosis not present

## 2022-06-22 DIAGNOSIS — H5203 Hypermetropia, bilateral: Secondary | ICD-10-CM | POA: Diagnosis not present

## 2022-06-22 DIAGNOSIS — H401133 Primary open-angle glaucoma, bilateral, severe stage: Secondary | ICD-10-CM | POA: Diagnosis not present

## 2022-06-22 LAB — HM DIABETES EYE EXAM

## 2022-06-23 ENCOUNTER — Encounter: Payer: Self-pay | Admitting: Internal Medicine

## 2022-06-23 ENCOUNTER — Ambulatory Visit (INDEPENDENT_AMBULATORY_CARE_PROVIDER_SITE_OTHER): Payer: Medicare Other | Admitting: Internal Medicine

## 2022-06-23 VITALS — BP 116/68 | HR 82 | Temp 97.9°F | Resp 16 | Ht 62.0 in | Wt 163.2 lb

## 2022-06-23 DIAGNOSIS — E1159 Type 2 diabetes mellitus with other circulatory complications: Secondary | ICD-10-CM

## 2022-06-23 DIAGNOSIS — I480 Paroxysmal atrial fibrillation: Secondary | ICD-10-CM

## 2022-06-23 DIAGNOSIS — I152 Hypertension secondary to endocrine disorders: Secondary | ICD-10-CM

## 2022-06-23 DIAGNOSIS — E1165 Type 2 diabetes mellitus with hyperglycemia: Secondary | ICD-10-CM | POA: Diagnosis not present

## 2022-06-23 DIAGNOSIS — E1169 Type 2 diabetes mellitus with other specified complication: Secondary | ICD-10-CM | POA: Diagnosis not present

## 2022-06-23 DIAGNOSIS — F439 Reaction to severe stress, unspecified: Secondary | ICD-10-CM

## 2022-06-23 DIAGNOSIS — E785 Hyperlipidemia, unspecified: Secondary | ICD-10-CM

## 2022-06-23 MED ORDER — AMLODIPINE BESYLATE 5 MG PO TABS: 5.0000 mg | ORAL_TABLET | Freq: Every day | ORAL | 0 refills | Status: DC

## 2022-06-23 MED ORDER — LISINOPRIL 10 MG PO TABS: 10.0000 mg | ORAL_TABLET | Freq: Every day | ORAL | 0 refills | Status: DC

## 2022-06-23 NOTE — Progress Notes (Signed)
Subjective:    [name], female    DOB: 1955-11-05, 67 y.o.   MRN: [id number]  Patient here for  Chief Complaint  Patient presents with   Medical Management of Chronic Issues    3 month follow up     HPI Scheduled to follow up regarding her blood sugar, blood pressure and cholesterol. Saw pharmacy - recommendation to start ozempic. Discussed diet and exercise.  Blood sugars 140-150.  Mother - history of thyroid cancer.  Unsure of type.  Wants to hold on starting until can determine family history.  Continues on jardiance and metformin.  Tries to stay active.  No chest pain or sob reported.  No increased cough or congestion.  Wisdom teeth removed last week.  Finishes abx today. Taking tylenol.  Handling stress.   Past Medical History:  Diagnosis Date   Achilles rupture, right    Diabetes mellitus without complication ([place])    Environmental allergies    Glaucoma    History of chicken pox    Hyperlipidemia    Hypertension    Past Surgical History:  Procedure Laterality Date   BREAST BIOPSY Right 1999   neg   COLONOSCOPY WITH PROPOFOL N/A 12/12/2017   Procedure: COLONOSCOPY WITH PROPOFOL;  Surgeon: Manya Silvas, MD;  Location: [facility] ENDOSCOPY;  Service: Endoscopy;  Laterality: N/A;   Family History  Problem Relation Age of Onset   Colon polyps Mother    Alzheimer's disease Mother    Heart attack Father 74   Hypertension Father    Diabetes Father    Hypertension Sister    Diabetes Sister        x2   COPD Sister    Diabetes Sister    Hypertension Sister    Glaucoma Sister    Arrhythmia Son        Faster heart beat than normal   Breast cancer Neg Hx    Colon cancer Neg Hx    Social History   Socioeconomic History   Marital status: Married    Spouse name: Not on file   Number of children: 3   Years of education: Not on file   Highest education level: Not on file  Occupational History    Employer: [employer]  Tobacco Use   Smoking status:  Never   Smokeless tobacco: Never  Vaping Use   Vaping Use: Never used  Substance and Sexual Activity   Alcohol use: No    Alcohol/week: 0.0 standard drinks of alcohol   Drug use: No   Sexual activity: Not on file  Other Topics Concern   Not on file  Social History Narrative   Not on file   Social Determinants of Health   Financial Resource Strain: Patient Declined (06/22/2022)   Overall Financial Resource Strain (CARDIA)    Difficulty of Paying Living Expenses: Patient declined  Food Insecurity: Patient Declined (06/22/2022)   Hunger Vital Sign    Worried About Running Out of Food in the Last Year: Patient declined    [place] in the Last Year: Patient declined  Transportation Needs: No Transportation Needs (06/22/2022)   PRAPARE - Hydrologist (Medical): No    Lack of Transportation (Non-Medical): No  Physical Activity: Unknown (06/22/2022)   Exercise Vital Sign    Days of Exercise per Week: Patient declined    Minutes of Exercise per Session: 30 min  Stress: Patient Declined (06/22/2022)   [employer] - Occupational Stress Questionnaire    Feeling of Stress : Patient declined  Social Connections: Unknown (06/22/2022)   Social Connection and Isolation Panel [NHANES]    Frequency of Communication with Friends and Family: Patient declined    Frequency of Social Gatherings with Friends and Family: Patient declined    Attends Religious Services: Patient declined    Marine scientist or Organizations: Yes    Attends Music therapist: More than 4 times per year    Marital Status: Patient declined     Review of Systems  Constitutional:  Negative for appetite change and fever.  HENT:  Negative for congestion and sinus pressure.   Respiratory:  Negative for cough, chest tightness and shortness of breath.   Cardiovascular:  Negative for chest pain and palpitations.  Gastrointestinal:  Negative for  abdominal pain, diarrhea, nausea and vomiting.  Genitourinary:  Negative for difficulty urinating and dysuria.  Musculoskeletal:  Negative for joint swelling and myalgias.  Skin:  Negative for color change and rash.  Neurological:  Negative for dizziness and headaches.  Psychiatric/Behavioral:  Negative for agitation and dysphoric mood.        Objective:     BP 116/68   Pulse 82   Temp 97.9 F (36.6 C)   Resp 16   Ht 5\' 2"  (1.575 m)   Wt 163 lb 3.2 oz (74 kg)   LMP 02/26/2008   SpO2 98%   BMI 29.85 kg/m  Wt Readings from Last 3 Encounters:  06/23/22 163 lb 3.2 oz (74 kg)  03/24/22 163 lb 3.2 oz (74 kg)  01/08/22 169 lb (76.7 kg)    Physical Exam Vitals reviewed.  Constitutional:      General: She is not in acute distress.    Appearance: Normal appearance.  HENT:     Head: Normocephalic and atraumatic.     Right Ear: External ear normal.     Left Ear: External ear normal.  Eyes:     General: No scleral icterus.       Right eye: No discharge.        Left eye: No discharge.     Conjunctiva/sclera: Conjunctivae normal.  Neck:     Thyroid: No thyromegaly.  Cardiovascular:     Rate and Rhythm: Normal rate and regular rhythm.  Pulmonary:     Effort: No respiratory distress.     Breath sounds: Normal breath sounds. No wheezing.  Abdominal:     General: Bowel sounds are normal.     Palpations: Abdomen is soft.     Tenderness: There is no abdominal tenderness.  Musculoskeletal:        General: No swelling or tenderness.     Cervical back: Neck supple. No tenderness.  Lymphadenopathy:     Cervical: No cervical adenopathy.  Skin:    Findings: No erythema or rash.  Neurological:     Mental Status: She is alert.  Psychiatric:        Mood and Affect: Mood normal.        Behavior: Behavior normal.      Outpatient Encounter Medications as of 06/23/2022  Medication Sig   amLODipine (NORVASC) 5 MG tablet Take 1 tablet (5 mg total) by mouth daily.    dorzolamide-timolol (COSOPT) 22.3-6.8 MG/ML ophthalmic solution Place 1 drop into both eyes 2 (two) times daily.   doxycycline (ADOXA) 50 MG tablet Take 1-2 tabs PO QD PRN flares   ELIQUIS 5 MG TABS tablet TAKE ONE TABLET  BY MOUTH TWICE DAILY   finasteride (PROSCAR) 5 MG tablet TAKE 1 TABLET BY MOUTH DAILY   Fluocinolone Acetonide Scalp 0.01 % OIL Apply to aa's scalp QD PRN flares.   GE100 BLOOD GLUCOSE TEST test strip TEST TWICE DAILY   JARDIANCE 25 MG TABS tablet TAKE 1 TABLET BY MOUTH ONCE DAILY BEFOREBREAKFAST   ketoconazole (NIZORAL) 2 % shampoo Shampoo into the scalp let sit several minutes then wash out. Use 2-3 days per week.   lisinopril (ZESTRIL) 10 MG tablet Take 1 tablet (10 mg total) by mouth daily.   metFORMIN (GLUCOPHAGE-XR) 500 MG 24 hr tablet TAKE 2 TABLETS BY MOUTH TWICE DAILY.   metoprolol tartrate (LOPRESSOR) 25 MG tablet TAKE 1 TABLET BY MOUTH 2 TIMES DAILY.   minoxidil (LONITEN) 2.5 MG tablet TAKE 1 TABLET BY MOUTH DAILY   Multiple Vitamin (MULTIVITAMIN) tablet Take 1 tablet by mouth daily.   nystatin cream (MYCOSTATIN) Apply 1 application topically 2 (two) times daily.   rosuvastatin (CRESTOR) 10 MG tablet TAKE ONE TABLET EVERY DAY   timolol (BETIMOL) 0.25 % ophthalmic solution Place 1-2 drops into both eyes daily.   triamcinolone cream (KENALOG) 0.1 % APPLY TO THE AFFECTED AREA DAILY AS NEEDED   vitamin E 200 UNIT capsule Take 200 Units by mouth daily.   [DISCONTINUED] amLODipine (NORVASC) 5 MG tablet TAKE ONE TABLET EVERY DAY   [DISCONTINUED] lisinopril (ZESTRIL) 10 MG tablet TAKE 1 TABLET BY MOUTH DAILY   No facility-administered encounter medications on file as of 06/23/2022.     Lab Results  Component Value Date   WBC 5.3 12/01/2021   HGB 13.8 12/01/2021   HCT 41.0 12/01/2021   PLT 193.0 12/01/2021   GLUCOSE 156 (H) 04/28/2022   CHOL 134 04/28/2022   TRIG 201.0 (H) 04/28/2022   HDL 43.80 04/28/2022   LDLDIRECT 63.0 04/28/2022   LDLCALC 34 12/01/2021    ALT 30 04/28/2022   AST 26 04/28/2022   NA 139 04/28/2022   K 4.2 04/28/2022   CL 104 04/28/2022   CREATININE 0.77 04/28/2022   BUN 15 04/28/2022   CO2 26 04/28/2022   TSH 0.73 12/01/2021   HGBA1C 7.2 (H) 04/28/2022   MICROALBUR 1.9 11/28/2020    MM 3D SCREEN BREAST BILATERAL  Result Date: 12/29/2021 CLINICAL DATA:  Screening. EXAM: DIGITAL SCREENING BILATERAL MAMMOGRAM WITH TOMOSYNTHESIS AND CAD TECHNIQUE: Bilateral screening digital craniocaudal and mediolateral oblique mammograms were obtained. Bilateral screening digital breast tomosynthesis was performed. The images were evaluated with computer-aided detection. COMPARISON:  Previous exam(s). ACR Breast Density Category c: The breast tissue is heterogeneously dense, which may obscure small masses. FINDINGS: There are no findings suspicious for malignancy. IMPRESSION: No mammographic evidence of malignancy. A result letter of this screening mammogram will be mailed directly to the patient. RECOMMENDATION: Screening mammogram in one year. (Code:SM-B-01Y) BI-RADS CATEGORY  1: Negative. Electronically Signed   By: Dorise Bullion III M.D.   On: 12/29/2021 17:08       Assessment & Plan:  Type 2 diabetes mellitus with hyperglycemia, without long-term current use of insulin (HCC) Assessment & Plan: Low carb diet and exercise. Last a1c 7.2.  On jardiance. Have discussed diet and exercise.  Loose stool resolved on metformin XR.  Saw pharmacy.  Recommended starting ozempic.  Mother with thyroid cancer - deceased.  Need to confirm type of thyroid cancer.  Follow met b and A1c.   Orders: -     Basic metabolic panel; Future -     Hemoglobin A1c;  Future -     Microalbumin / creatinine urine ratio; Future  Hypertension associated with type 2 diabetes mellitus ([place]) Assessment & Plan: Blood pressure as outlined.  Currently on lopressor, amlodipine and lisinopril.  Follow pressures.  Follow metabolic panel.    Hyperlipidemia associated with type  2 diabetes mellitus ([place]) Assessment & Plan: On crestor.  Low cholesterol diet and exercise. Follow lipid panel and liver function tests.   Lab Results  Component Value Date   CHOL 134 04/28/2022   HDL 43.80 04/28/2022   LDLCALC 34 12/01/2021   LDLDIRECT 63.0 04/28/2022   TRIG 201.0 (H) 04/28/2022   CHOLHDL 3 04/28/2022    Orders: -     Hepatic function panel; Future -     Lipid panel; Future  PAF (paroxysmal atrial fibrillation) ([place]) Assessment & Plan: On eliquis.  Continue metoprlol.  No chest pain or sob reported.  Rate controlled, Follow.    Stress Assessment & Plan: Overall appears to be handling stress relatively well.  Follow.    Other orders -     amLODIPine Besylate; Take 1 tablet (5 mg total) by mouth daily.  Dispense: 90 tablet; Refill: 0 -     Lisinopril; Take 1 tablet (10 mg total) by mouth daily.  Dispense: 90 tablet; Refill: 0     Einar Pheasant, MD

## 2022-06-30 ENCOUNTER — Other Ambulatory Visit (HOSPITAL_COMMUNITY): Payer: Self-pay

## 2022-07-04 ENCOUNTER — Encounter: Payer: Self-pay | Admitting: Internal Medicine

## 2022-07-04 NOTE — Assessment & Plan Note (Signed)
Blood pressure as outlined.  Currently on lopressor, amlodipine and lisinopril.  Follow pressures.  Follow metabolic panel.  

## 2022-07-04 NOTE — Assessment & Plan Note (Signed)
Low carb diet and exercise. Last a1c 7.2.  On jardiance. Have discussed diet and exercise.  Loose stool resolved on metformin XR.  Saw pharmacy.  Recommended starting ozempic.  Mother with thyroid cancer - deceased.  Need to confirm type of thyroid cancer.  Follow met b and A1c.

## 2022-07-04 NOTE — Assessment & Plan Note (Signed)
Overall appears to be handling stress relatively well. Follow.   

## 2022-07-04 NOTE — Assessment & Plan Note (Signed)
On eliquis.  Continue metoprlol.  No chest pain or sob reported.  Rate controlled, Follow.

## 2022-07-04 NOTE — Assessment & Plan Note (Signed)
On crestor.  Low cholesterol diet and exercise. Follow lipid panel and liver function tests.   Lab Results  Component Value Date   CHOL 134 04/28/2022   HDL 43.80 04/28/2022   LDLCALC 34 12/01/2021   LDLDIRECT 63.0 04/28/2022   TRIG 201.0 (H) 04/28/2022   CHOLHDL 3 04/28/2022

## 2022-07-07 ENCOUNTER — Telehealth: Payer: Self-pay | Admitting: Internal Medicine

## 2022-07-07 NOTE — Telephone Encounter (Signed)
April Solis was supposed to see if she could find out what type of thyroid cancer her mother had.  Need to know if she was able to find out.  Thanks

## 2022-07-08 NOTE — Telephone Encounter (Signed)
Pt returned [place] LPN call. Unable to transferred.

## 2022-07-08 NOTE — Telephone Encounter (Signed)
LMTCB

## 2022-07-09 NOTE — Telephone Encounter (Signed)
This is the patient I was asking you about. Mother had thyroid cancer.  Are we able to access deceased chart and see if can figure out what type thyroid cancer?

## 2022-07-09 NOTE — Telephone Encounter (Signed)
So, does she have to request records or can I open the chart.  Sorry.  I did not understand.

## 2022-07-09 NOTE — Telephone Encounter (Signed)
Ok.  Thank you for checking.  So, do we just contact her and have her come here to sign release.  If so, ok.  Thanks.

## 2022-07-09 NOTE — Telephone Encounter (Signed)
No I tell Bethann Berkshire to give her number to HIM and have her call or go by to sign their release.

## 2022-07-09 NOTE — Telephone Encounter (Signed)
Noted  

## 2022-07-09 NOTE — Telephone Encounter (Signed)
She was not able to find out what kind of cancer her mom had.

## 2022-07-09 NOTE — Telephone Encounter (Signed)
Sorry she has to sign a release and request records we cannot open chart.

## 2022-07-09 NOTE — Telephone Encounter (Signed)
[place] residents can obtain the medical records of deceased persons, by having the next of kin sign a release, provide proof of death and relationship, such as death certificate and affidavit of next of kin. This statute will assist you in obtaining those records under the law in [place].   So I would think as long as daughter signs a release we would be okay.

## 2022-07-10 ENCOUNTER — Other Ambulatory Visit: Payer: Self-pay | Admitting: Internal Medicine

## 2022-07-13 ENCOUNTER — Other Ambulatory Visit (HOSPITAL_COMMUNITY): Payer: Self-pay

## 2022-07-13 NOTE — Telephone Encounter (Signed)
Reached out to pt to follow up on mailed application. Pt is currently waiting on tax return information and will submit pt portion of application at earliest convenience.

## 2022-07-13 NOTE — Telephone Encounter (Signed)
Patient aware of below and says that she will have to obtain records from [facility] because that is where her moms thyroid was followed. Advised her to let us know if she needs anything.

## 2022-07-15 ENCOUNTER — Other Ambulatory Visit: Payer: Self-pay | Admitting: Internal Medicine

## 2022-07-15 ENCOUNTER — Other Ambulatory Visit: Payer: Self-pay | Admitting: Dermatology

## 2022-07-15 DIAGNOSIS — L649 Androgenic alopecia, unspecified: Secondary | ICD-10-CM

## 2022-07-21 ENCOUNTER — Encounter: Payer: Self-pay | Admitting: Internal Medicine

## 2022-07-27 ENCOUNTER — Ambulatory Visit: Payer: Medicare Other | Admitting: Dermatology

## 2022-07-27 VITALS — BP 132/69 | HR 70

## 2022-07-27 DIAGNOSIS — L649 Androgenic alopecia, unspecified: Secondary | ICD-10-CM | POA: Diagnosis not present

## 2022-07-27 NOTE — Patient Instructions (Addendum)
Doses of minoxidil for hair loss are considered 'low dose'. This is because the doses used for hair loss are much lower than the doses which are used for conditions such as high blood pressure (hypertension). The doses used for hypertension are 10-40mg per day.  Side effects are uncommon at the low doses (up to 2.5 mg/day) used to treat hair loss. Potential side effects, more commonly seen at higher doses, include: Increase in hair growth (hypertrichosis) elsewhere on face and body Temporary hair shedding upon starting medication which may last up to 4 weeks Ankle swelling, fluid retention, rapid weight gain more than 5 pounds Low blood pressure and feeling lightheaded or dizzy when standing up quickly Fast or irregular heartbeat Headaches   Due to recent changes in healthcare laws, you may see results of your pathology and/or laboratory studies on MyChart before the doctors have had a chance to review them. We understand that in some cases there may be results that are confusing or concerning to you. Please understand that not all results are received at the same time and often the doctors may need to interpret multiple results in order to provide you with the best plan of care or course of treatment. Therefore, we ask that you please give us 2 business days to thoroughly review all your results before contacting the office for clarification. Should we see a critical lab result, you will be contacted sooner.   If You Need Anything After Your Visit  If you have any questions or concerns for your doctor, please call our main line at [phone] and press option 4 to reach your doctor's medical assistant. If no one answers, please leave a voicemail as directed and we will return your call as soon as possible. Messages left after 4 pm will be answered the following business day.   You may also send us a message via MyChart. We typically respond to MyChart messages within 1-2 business days.  For  prescription refills, please ask your pharmacy to contact our office. Our fax number is [phone].  If you have an urgent issue when the clinic is closed that cannot wait until the next business day, you can page your doctor at the number below.    Please note that while we do our best to be available for urgent issues outside of office hours, we are not available 24/7.   If you have an urgent issue and are unable to reach us, you may choose to seek medical care at your doctor's office, retail clinic, urgent care center, or emergency room.  If you have a medical emergency, please immediately call 911 or go to the emergency department.  Pager Numbers  - Dr. Kowalski: [phone]  - Dr. Moye: [phone]  - Dr. Stewart: [phone]  In the event of inclement weather, please call our main line at [phone] for an update on the status of any delays or closures.  Dermatology Medication Tips: Please keep the boxes that topical medications come in in order to help keep track of the instructions about where and how to use these. Pharmacies typically print the medication instructions only on the boxes and not directly on the medication tubes.   If your medication is too expensive, please contact our office at [phone] option 4 or send us a message through MyChart.   We are unable to tell what your co-pay for medications will be in advance as this is different depending on your insurance coverage. However, we may be able to   find a substitute medication at lower cost or fill out paperwork to get insurance to cover a needed medication.   If a prior authorization is required to get your medication covered by your insurance company, please allow us 1-2 business days to complete this process.  Drug prices often vary depending on where the prescription is filled and some pharmacies may offer cheaper prices.  The website www.goodrx.com contains coupons for medications through different  pharmacies. The prices here do not account for what the cost may be with help from insurance (it may be cheaper with your insurance), but the website can give you the price if you did not use any insurance.  - You can print the associated coupon and take it with your prescription to the pharmacy.  - You may also stop by our office during regular business hours and pick up a GoodRx coupon card.  - If you need your prescription sent electronically to a different pharmacy, notify our office through  MyChart or by phone at [phone] option 4.     Si Usted Necesita Algo Despus de Su Visita  Tambin puede enviarnos un mensaje a travs de MyChart. Por lo general respondemos a los mensajes de MyChart en el transcurso de 1 a 2 das hbiles.  Para renovar recetas, por favor pida a su farmacia que se ponga en contacto con nuestra oficina. Nuestro nmero de fax es el [phone].  Si tiene un asunto urgente cuando la clnica est cerrada y que no puede esperar hasta el siguiente da hbil, puede llamar/localizar a su doctor(a) al nmero que aparece a continuacin.   Por favor, tenga en cuenta que aunque hacemos todo lo posible para estar disponibles para asuntos urgentes fuera del horario de oficina, no estamos disponibles las 24 horas del da, los 7 das de la semana.   Si tiene un problema urgente y no puede comunicarse con nosotros, puede optar por buscar atencin mdica  en el consultorio de su doctor(a), en una clnica privada, en un centro de atencin urgente o en una sala de emergencias.  Si tiene una emergencia mdica, por favor llame inmediatamente al 911 o vaya a la sala de emergencias.  Nmeros de bper  - Dr. Kowalski: [phone]  - Dra. Moye: [phone]  - Dra. Stewart: [phone]  En caso de inclemencias del tiempo, por favor llame a nuestra lnea principal al [phone] para una actualizacin sobre el estado de cualquier retraso o cierre.  Consejos para la  medicacin en dermatologa: Por favor, guarde las cajas en las que vienen los medicamentos de uso tpico para ayudarle a seguir las instrucciones sobre dnde y cmo usarlos. Las farmacias generalmente imprimen las instrucciones del medicamento slo en las cajas y no directamente en los tubos del medicamento.   Si su medicamento es muy caro, por favor, pngase en contacto con nuestra oficina llamando al [phone] y presione la opcin 4 o envenos un mensaje a travs de MyChart.   No podemos decirle cul ser su copago por los medicamentos por adelantado ya que esto es diferente dependiendo de la cobertura de su seguro. Sin embargo, es posible que podamos encontrar un medicamento sustituto a menor costo o llenar un formulario para que el seguro cubra el medicamento que se considera necesario.   Si se requiere una autorizacin previa para que su compaa de seguros cubra su medicamento, por favor permtanos de 1 a 2 das hbiles para completar este proceso.  Los precios de los medicamentos varan con frecuencia dependiendo   del lugar de dnde se surte la receta y alguna farmacias pueden ofrecer precios ms baratos.  El sitio web www.goodrx.com tiene cupones para medicamentos de diferentes farmacias. Los precios aqu no tienen en cuenta lo que podra costar con la ayuda del seguro (puede ser ms barato con su seguro), pero el sitio web puede darle el precio si no utiliz ningn seguro.  - Puede imprimir el cupn correspondiente y llevarlo con su receta a la farmacia.  - Tambin puede pasar por nuestra oficina durante el horario de atencin regular y recoger una tarjeta de cupones de GoodRx.  - Si necesita que su receta se enve electrnicamente a una farmacia diferente, informe a nuestra oficina a travs de MyChart de [place] o por telfono llamando al [phone] y presione la opcin 4.  

## 2022-07-27 NOTE — Progress Notes (Signed)
   Follow-Up Visit   Subjective  April Solis is a 67 y.o. female who presents for the following: Androgenetic alopecia of the scalp. She has noticed improvement taking finasteride  1/2 tablet and Minoxidil 2.5mg  1/2 tablet daily. No side effect from meds.    The following portions of the chart were reviewed this encounter and updated as appropriate: medications, allergies, medical history  Review of Systems:  No other skin or systemic complaints except as noted in HPI or Assessment and Plan.  Objective  Well appearing patient in no apparent distress; mood and affect are within normal limits.  A focused examination was performed of the following areas: Face, scalp  Relevant exam findings are noted in the Assessment and Plan.    Assessment & Plan   ANDROGENETIC ALOPECIA (FEMALE PATTERN HAIR LOSS) Exam: Diffuse thinning of the crown and widening of the midline part with retention of the frontal hairline, improving compared to photos 10/13/2021.  Chronic and persistent condition with duration or expected duration over one year. Condition is symptomatic/ bothersome to patient. Not currently at goal, but improving.   Female Androgenic Alopecia is a chronic condition related to genetics and/or hormonal changes.  In women androgenetic alopecia is commonly associated with menopause but may occur any time after puberty.  It causes hair thinning primarily on the crown with widening of the part and temporal hairline recession.  Can use OTC Rogaine (minoxidil) 5% solution/foam as directed.  Oral treatments in female patients who have no contraindication may include : - Low dose oral minoxidil 1.25 -  daily - Spironolactone 50 -  bid - Finasteride 2.5 - 5 mg daily Adjunctive therapies include: - Low Level Laser Light Therapy (LLLT) - Platelet-rich plasma injections (PRP) - Hair Transplants or scalp reduction   Treatment Plan: BP 132/69 Discussed increasing to full  tablet, patient prefers to continue 1/2 tablet of each daily.  Continue finasteride 5 mg 1/2 tablet once daily Continue minoxidil 2.5 mg 1/2 tablet once daily   Doses of minoxidil for hair loss are considered 'low dose'. This is because the doses used for hair loss are a lot lower than the doses which are used for conditions such as high blood pressure (hypertension). The doses used for hypertension are 10-40mg  per day.  Side effects are uncommon at the low doses (up to 2.5 mg/day) used to treat hair loss. Potential side effects, more commonly seen at higher doses, include: Increase in hair growth (hypertrichosis) elsewhere on face and body Temporary hair shedding upon starting medication which may last up to 4 weeks Ankle swelling, fluid retention, rapid weight gain more than 5 pounds Low blood pressure and feeling lightheaded or dizzy when standing up quickly Fast or irregular heartbeat Headaches     Return in about 6 months (around 01/26/2023) for Alopecia.  April Solis, CMA, am acting as scribe for Willeen Niece, MD .   Documentation: I have reviewed the above documentation for accuracy and completeness, and I agree with the above.  Willeen Niece, MD

## 2022-08-09 NOTE — Telephone Encounter (Signed)
Thank you! Online application has been submitted

## 2022-08-09 NOTE — Telephone Encounter (Signed)
Online application has been submitted

## 2022-08-09 NOTE — Telephone Encounter (Signed)
Received pt portion of application, need confirmation of ozempic dose to fill out provider portion.

## 2022-08-09 NOTE — Telephone Encounter (Signed)
Care Coordination  Please fill out ozempic PAP for 0.5mg  weekly. Thank you!  Lynnda Shields, PharmD, BCPS Clinical Pharmacist [facility]

## 2022-08-16 DIAGNOSIS — K080 Exfoliation of teeth due to systemic causes: Secondary | ICD-10-CM | POA: Diagnosis not present

## 2022-08-25 ENCOUNTER — Telehealth: Payer: Self-pay

## 2022-08-25 NOTE — Telephone Encounter (Signed)
Patient assistance received. Holding meds until we can confirm if ok for patient to take due to moms thyroid cancer hx. Patient is aware. Has appt 5/30.

## 2022-08-31 ENCOUNTER — Other Ambulatory Visit (INDEPENDENT_AMBULATORY_CARE_PROVIDER_SITE_OTHER): Payer: Medicare Other

## 2022-08-31 DIAGNOSIS — E1165 Type 2 diabetes mellitus with hyperglycemia: Secondary | ICD-10-CM

## 2022-08-31 DIAGNOSIS — E785 Hyperlipidemia, unspecified: Secondary | ICD-10-CM | POA: Diagnosis not present

## 2022-08-31 DIAGNOSIS — E1169 Type 2 diabetes mellitus with other specified complication: Secondary | ICD-10-CM | POA: Diagnosis not present

## 2022-08-31 DIAGNOSIS — Z7984 Long term (current) use of oral hypoglycemic drugs: Secondary | ICD-10-CM | POA: Diagnosis not present

## 2022-08-31 LAB — HEPATIC FUNCTION PANEL
ALT: 30 U/L (ref 0–35)
AST: 25 U/L (ref 0–37)
Albumin: 4.2 g/dL (ref 3.5–5.2)
Alkaline Phosphatase: 63 U/L (ref 39–117)
Bilirubin, Direct: 0.1 mg/dL (ref 0.0–0.3)
Total Bilirubin: 0.5 mg/dL (ref 0.2–1.2)
Total Protein: 7.1 g/dL (ref 6.0–8.3)

## 2022-08-31 LAB — BASIC METABOLIC PANEL
BUN: 19 mg/dL (ref 6–23)
CO2: 26 mEq/L (ref 19–32)
Calcium: 9.1 mg/dL (ref 8.4–10.5)
Chloride: 104 mEq/L (ref 96–112)
Creatinine, Ser: 0.89 mg/dL (ref 0.40–1.20)
GFR: 67.36 mL/min (ref >60.00)
Glucose, Bld: 172 mg/dL — ABNORMAL HIGH (ref 70–99)
Potassium: 4.5 mEq/L (ref 3.5–5.1)
Sodium: 140 mEq/L (ref 135–145)

## 2022-08-31 LAB — MICROALBUMIN / CREATININE URINE RATIO
Creatinine,U: 62.7 mg/dL
Microalb Creat Ratio: 1.1 mg/g (ref 0.0–30.0)
Microalb, Ur: <0.7 mg/dL (ref 0.0–1.9)

## 2022-08-31 LAB — LIPID PANEL
Cholesterol: 109 mg/dL (ref 0–200)
HDL: 38.8 mg/dL — ABNORMAL LOW (ref >39.00)
LDL Cholesterol: 31 mg/dL (ref 0–99)
NonHDL: 69.94
Total CHOL/HDL Ratio: 3
Triglycerides: 195 mg/dL — ABNORMAL HIGH (ref 0.0–149.0)
VLDL: 39 mg/dL (ref 0.0–40.0)

## 2022-08-31 LAB — HEMOGLOBIN A1C: Hgb A1c MFr Bld: 6.8 % — ABNORMAL HIGH (ref 4.6–6.5)

## 2022-09-02 ENCOUNTER — Ambulatory Visit (INDEPENDENT_AMBULATORY_CARE_PROVIDER_SITE_OTHER): Payer: Medicare Other | Admitting: Internal Medicine

## 2022-09-02 ENCOUNTER — Encounter: Payer: Self-pay | Admitting: Internal Medicine

## 2022-09-02 VITALS — BP 106/68 | HR 72 | Temp 98.0°F | Resp 16 | Ht 62.0 in | Wt 171.0 lb

## 2022-09-02 DIAGNOSIS — E1159 Type 2 diabetes mellitus with other circulatory complications: Secondary | ICD-10-CM

## 2022-09-02 DIAGNOSIS — E1169 Type 2 diabetes mellitus with other specified complication: Secondary | ICD-10-CM | POA: Diagnosis not present

## 2022-09-02 DIAGNOSIS — E1165 Type 2 diabetes mellitus with hyperglycemia: Secondary | ICD-10-CM | POA: Diagnosis not present

## 2022-09-02 DIAGNOSIS — I152 Hypertension secondary to endocrine disorders: Secondary | ICD-10-CM

## 2022-09-02 DIAGNOSIS — Z7984 Long term (current) use of oral hypoglycemic drugs: Secondary | ICD-10-CM

## 2022-09-02 DIAGNOSIS — F439 Reaction to severe stress, unspecified: Secondary | ICD-10-CM

## 2022-09-02 DIAGNOSIS — I480 Paroxysmal atrial fibrillation: Secondary | ICD-10-CM

## 2022-09-02 DIAGNOSIS — I10 Essential (primary) hypertension: Secondary | ICD-10-CM

## 2022-09-02 DIAGNOSIS — E785 Hyperlipidemia, unspecified: Secondary | ICD-10-CM

## 2022-09-02 LAB — HM DIABETES FOOT EXAM

## 2022-09-02 MED ORDER — METOPROLOL TARTRATE 25 MG PO TABS: 25.0000 mg | ORAL_TABLET | Freq: Two times a day (BID) | ORAL | 3 refills | Status: DC

## 2022-09-02 MED ORDER — APIXABAN 5 MG PO TABS: 5.0000 mg | ORAL_TABLET | Freq: Two times a day (BID) | ORAL | 1 refills | Status: DC

## 2022-09-02 MED ORDER — EMPAGLIFLOZIN 25 MG PO TABS: ORAL_TABLET | ORAL | 1 refills | Status: DC

## 2022-09-02 MED ORDER — METFORMIN HCL ER 500 MG PO TB24: 1000.0000 mg | ORAL_TABLET | Freq: Two times a day (BID) | ORAL | 1 refills | Status: DC

## 2022-09-02 MED ORDER — LISINOPRIL 10 MG PO TABS: 10.0000 mg | ORAL_TABLET | Freq: Every day | ORAL | 3 refills | Status: DC

## 2022-09-02 MED ORDER — ROSUVASTATIN CALCIUM 10 MG PO TABS: 10.0000 mg | ORAL_TABLET | Freq: Every day | ORAL | 3 refills | Status: DC

## 2022-09-02 MED ORDER — AMLODIPINE BESYLATE 5 MG PO TABS: 5.0000 mg | ORAL_TABLET | Freq: Every day | ORAL | 3 refills | Status: DC

## 2022-09-02 NOTE — Progress Notes (Addendum)
Subjective:    [name], female    DOB: 1955/11/28, 67 y.o.   MRN: [id number]  Patient here for  Chief Complaint  Patient presents with   Medical Management of Chronic Issues    HPI Here to follow up regarding hypercholesterolemia, diabetes and hypertension.  She reports she is doing well.  No chest pain or sob reported.  No cough or congestion.  No abdominal pain or bowel change reported.  Blood sugars - mostly averaging 120-140.  Discussed recent labs.     Past Medical History:  Diagnosis Date   Achilles rupture, right    Diabetes mellitus without complication (HCC)    Environmental allergies    Glaucoma    History of chicken pox    Hyperlipidemia    Hypertension    Past Surgical History:  Procedure Laterality Date   BREAST BIOPSY Right 1999   neg   COLONOSCOPY WITH PROPOFOL N/A 12/12/2017   Procedure: COLONOSCOPY WITH PROPOFOL;  Surgeon: Scot Jun, MD;  Location: [facility] ENDOSCOPY;  Service: Endoscopy;  Laterality: N/A;   Family History  Problem Relation Age of Onset   Colon polyps Mother    Alzheimer's disease Mother    Heart attack Father 24   Hypertension Father    Diabetes Father    Hypertension Sister    Diabetes Sister        x2   COPD Sister    Diabetes Sister    Hypertension Sister    Glaucoma Sister    Arrhythmia Son        Faster heart beat than normal   Breast cancer Neg Hx    Colon cancer Neg Hx    Social History   Socioeconomic History   Marital status: Married    Spouse name: Not on file   Number of children: 3   Years of education: Not on file   Highest education level: Not on file  Occupational History    Employer: [employer]  Tobacco Use   Smoking status: Never   Smokeless tobacco: Never  Vaping Use   Vaping Use: Never used  Substance and Sexual Activity   Alcohol use: No    Alcohol/week: 0.0 standard drinks of alcohol   Drug use: No   Sexual activity: Not on file  Other Topics Concern   Not on file   Social History Narrative   Not on file   Social Determinants of Health   Financial Resource Strain: Patient Declined (06/22/2022)   Overall Financial Resource Strain (CARDIA)    Difficulty of Paying Living Expenses: Patient declined  Food Insecurity: Patient Declined (06/22/2022)   Hunger Vital Sign    Worried About Running Out of Food in the Last Year: Patient declined    Ran Out of Food in the Last Year: Patient declined  Transportation Needs: No Transportation Needs (06/22/2022)   PRAPARE - Administrator, Civil Service (Medical): No    Lack of Transportation (Non-Medical): No  Physical Activity: Unknown (06/22/2022)   Exercise Vital Sign    Days of Exercise per Week: Patient declined    Minutes of Exercise per Session: 30 min  Stress: Patient Declined (06/22/2022)   [employer] of Occupational Health - Occupational Stress Questionnaire    Feeling of Stress : Patient declined  Social Connections: Unknown (06/22/2022)   Social Connection and Isolation Panel [NHANES]    Frequency of Communication with Friends and Family: Patient declined    Frequency of Social Gatherings with  Friends and Family: Patient declined    Attends Religious Services: Patient declined    Active Member of Clubs or Organizations: Yes    Attends Engineer, structural: More than 4 times per year    Marital Status: Patient declined     Review of Systems  Constitutional:  Negative for appetite change and unexpected weight change.  HENT:  Negative for congestion and sinus pressure.   Respiratory:  Negative for cough, chest tightness and shortness of breath.   Cardiovascular:  Negative for chest pain, palpitations and leg swelling.  Gastrointestinal:  Negative for abdominal pain, diarrhea, nausea and vomiting.  Genitourinary:  Negative for difficulty urinating and dysuria.  Musculoskeletal:  Negative for joint swelling and myalgias.  Skin:  Negative for color change and rash.   Neurological:  Negative for dizziness and headaches.  Psychiatric/Behavioral:  Negative for agitation and dysphoric mood.        Objective:     BP 106/68   Pulse 72   Temp 98 F (36.7 C)   Resp 16   Ht 5\' 2"  (1.575 m)   Wt 171 lb (77.6 kg)   LMP 02/26/2008   SpO2 97%   BMI 31.28 kg/m  Wt Readings from Last 3 Encounters:  09/02/22 171 lb (77.6 kg)  06/23/22 163 lb 3.2 oz (74 kg)  03/24/22 163 lb 3.2 oz (74 kg)    Physical Exam Vitals reviewed.  Constitutional:      General: She is not in acute distress.    Appearance: Normal appearance.  HENT:     Head: Normocephalic and atraumatic.     Right Ear: External ear normal.     Left Ear: External ear normal.  Eyes:     General: No scleral icterus.       Right eye: No discharge.        Left eye: No discharge.     Conjunctiva/sclera: Conjunctivae normal.  Neck:     Thyroid: No thyromegaly.  Cardiovascular:     Rate and Rhythm: Normal rate and regular rhythm.  Pulmonary:     Effort: No respiratory distress.     Breath sounds: Normal breath sounds. No wheezing.  Abdominal:     General: Bowel sounds are normal.     Palpations: Abdomen is soft.     Tenderness: There is no abdominal tenderness.  Musculoskeletal:        General: No swelling or tenderness.     Cervical back: Neck supple. No tenderness.  Lymphadenopathy:     Cervical: No cervical adenopathy.  Skin:    Findings: No erythema or rash.  Neurological:     Mental Status: She is alert.  Psychiatric:        Mood and Affect: Mood normal.        Behavior: Behavior normal.      Outpatient Encounter Medications as of 09/02/2022  Medication Sig   amLODipine (NORVASC) 5 MG tablet Take 1 tablet (5 mg total) by mouth daily.   apixaban (ELIQUIS) 5 MG TABS tablet Take 1 tablet (5 mg total) by mouth 2 (two) times daily.   dorzolamide-timolol (COSOPT) 22.3-6.8 MG/ML ophthalmic solution Place 1 drop into both eyes 2 (two) times daily.   doxycycline (ADOXA) 50 MG  tablet Take 1-2 tabs PO QD PRN flares   empagliflozin (JARDIANCE) 25 MG TABS tablet TAKE 1 TABLET BY MOUTH ONCE DAILY BEFOREBREAKFAST   finasteride (PROSCAR) 5 MG tablet TAKE 1 TABLET BY MOUTH DAILY   Fluocinolone Acetonide Scalp 0.01 %  OIL Apply to aa's scalp QD PRN flares.   GE100 BLOOD GLUCOSE TEST test strip TEST TWICE DAILY   ketoconazole (NIZORAL) 2 % shampoo Shampoo into the scalp let sit several minutes then wash out. Use 2-3 days per week.   lisinopril (ZESTRIL) 10 MG tablet Take 1 tablet (10 mg total) by mouth daily.   metFORMIN (GLUCOPHAGE-XR) 500 MG 24 hr tablet Take 2 tablets (1,000 mg total) by mouth in the morning and at bedtime.   metoprolol tartrate (LOPRESSOR) 25 MG tablet Take 1 tablet (25 mg total) by mouth 2 (two) times daily.   minoxidil (LONITEN) 2.5 MG tablet TAKE 1 TABLET BY MOUTH DAILY   Multiple Vitamin (MULTIVITAMIN) tablet Take 1 tablet by mouth daily.   nystatin cream (MYCOSTATIN) Apply 1 application topically 2 (two) times daily.   rosuvastatin (CRESTOR) 10 MG tablet Take 1 tablet (10 mg total) by mouth daily.   timolol (BETIMOL) 0.25 % ophthalmic solution Place 1-2 drops into both eyes daily.   triamcinolone cream (KENALOG) 0.1 % APPLY TO THE AFFECTED AREA DAILY AS NEEDED   vitamin E 200 UNIT capsule Take 200 Units by mouth daily.   [DISCONTINUED] amLODipine (NORVASC) 5 MG tablet Take 1 tablet (5 mg total) by mouth daily.   [DISCONTINUED] ELIQUIS 5 MG TABS tablet TAKE ONE TABLET BY MOUTH TWICE DAILY   [DISCONTINUED] JARDIANCE 25 MG TABS tablet TAKE 1 TABLET BY MOUTH ONCE DAILY BEFOREBREAKFAST   [DISCONTINUED] lisinopril (ZESTRIL) 10 MG tablet Take 1 tablet (10 mg total) by mouth daily.   [DISCONTINUED] metFORMIN (GLUCOPHAGE-XR) 500 MG 24 hr tablet TAKE TWO TABLETS TWICE A DAY   [DISCONTINUED] metoprolol tartrate (LOPRESSOR) 25 MG tablet TAKE 1 TABLET BY MOUTH 2 TIMES DAILY.   [DISCONTINUED] rosuvastatin (CRESTOR) 10 MG tablet TAKE ONE TABLET EVERY DAY   No  facility-administered encounter medications on file as of 09/02/2022.     Lab Results  Component Value Date   WBC 5.3 12/01/2021   HGB 13.8 12/01/2021   HCT 41.0 12/01/2021   PLT 193.0 12/01/2021   GLUCOSE 172 (H) 08/31/2022   CHOL 109 08/31/2022   TRIG 195.0 (H) 08/31/2022   HDL 38.80 (L) 08/31/2022   LDLDIRECT 63.0 04/28/2022   LDLCALC 31 08/31/2022   ALT 30 08/31/2022   AST 25 08/31/2022   NA 140 08/31/2022   K 4.5 08/31/2022   CL 104 08/31/2022   CREATININE 0.89 08/31/2022   BUN 19 08/31/2022   CO2 26 08/31/2022   TSH 0.73 12/01/2021   HGBA1C 6.8 (H) 08/31/2022   MICROALBUR <0.7 08/31/2022    MM 3D SCREEN BREAST BILATERAL  Result Date: 12/29/2021 CLINICAL DATA:  Screening. EXAM: DIGITAL SCREENING BILATERAL MAMMOGRAM WITH TOMOSYNTHESIS AND CAD TECHNIQUE: Bilateral screening digital craniocaudal and mediolateral oblique mammograms were obtained. Bilateral screening digital breast tomosynthesis was performed. The images were evaluated with computer-aided detection. COMPARISON:  Previous exam(s). ACR Breast Density Category c: The breast tissue is heterogeneously dense, which may obscure small masses. FINDINGS: There are no findings suspicious for malignancy. IMPRESSION: No mammographic evidence of malignancy. A result letter of this screening mammogram will be mailed directly to the patient. RECOMMENDATION: Screening mammogram in one year. (Code:SM-B-01Y) BI-RADS CATEGORY  1: Negative. Electronically Signed   By: Gerome Sam III M.D.   On: 12/29/2021 17:08       Assessment & Plan:  Type 2 diabetes mellitus with hyperglycemia, without long-term current use of insulin (HCC) Assessment & Plan: Low carb diet and exercise. Recent a1c 6.8.  On jardiance. Have discussed diet and exercise.  Loose stool resolved on metformin XR.  Saw pharmacy.  Recommended starting ozempic.  Mother with thyroid cancer - deceased.  Have been unable to determine the type of thyroid cancer.  Hold on  starting GLP-1 agonist.  Sugars improved.  Hold on making changes.  Follow met b and A1c.   Orders: -     Hemoglobin A1c; Future  Hyperlipidemia associated with type 2 diabetes mellitus (HCC) Assessment & Plan: On crestor.  Low cholesterol diet and exercise. Follow lipid panel and liver function tests.   Lab Results  Component Value Date   CHOL 109 08/31/2022   HDL 38.80 (L) 08/31/2022   LDLCALC 31 08/31/2022   LDLDIRECT 63.0 04/28/2022   TRIG 195.0 (H) 08/31/2022   CHOLHDL 3 08/31/2022    Orders: -     Hepatic function panel; Future -     Lipid panel; Future  Essential hypertension -     CBC with Differential/Platelet; Future -     Basic metabolic panel; Future -     TSH; Future  Hypertension associated with type 2 diabetes mellitus (HCC) Assessment & Plan: Blood pressure as outlined.  Currently on lopressor, amlodipine and lisinopril.  Follow pressures.  Follow metabolic panel.    PAF (paroxysmal atrial fibrillation) (HCC) Assessment & Plan: On eliquis.  Continue metoprlol.  No chest pain or sob reported.  Rate controlled, Follow. Has cut back on dose of minoxidil.  Feels better.  Follow.    Stress Assessment & Plan: Overall appears to be handling stress relatively well.  Follow.    Other orders -     amLODIPine Besylate; Take 1 tablet (5 mg total) by mouth daily.  Dispense: 90 tablet; Refill: 3 -     Apixaban; Take 1 tablet (5 mg total) by mouth 2 (two) times daily.  Dispense: 180 tablet; Refill: 1 -     Empagliflozin; TAKE 1 TABLET BY MOUTH ONCE DAILY BEFOREBREAKFAST  Dispense: 90 tablet; Refill: 1 -     Lisinopril; Take 1 tablet (10 mg total) by mouth daily.  Dispense: 90 tablet; Refill: 3 -     metFORMIN HCl ER; Take 2 tablets (1,000 mg total) by mouth in the morning and at bedtime.  Dispense: 360 tablet; Refill: 1 -     Metoprolol Tartrate; Take 1 tablet (25 mg total) by mouth 2 (two) times daily.  Dispense: 180 tablet; Refill: 3 -     Rosuvastatin Calcium;  Take 1 tablet (10 mg total) by mouth daily.  Dispense: 90 tablet; Refill: 3     Dale Greenevers, MD

## 2022-09-03 NOTE — Telephone Encounter (Signed)
Please follow-up on this. (Sample fridge is full)

## 2022-09-03 NOTE — Telephone Encounter (Signed)
Unable to use at this time.  Trying to confirm pts mother's cancer diagnosis

## 2022-09-05 ENCOUNTER — Encounter: Payer: Self-pay | Admitting: Internal Medicine

## 2022-09-05 NOTE — Assessment & Plan Note (Signed)
Overall appears to be handling stress relatively well. Follow.   

## 2022-09-05 NOTE — Assessment & Plan Note (Signed)
Blood pressure as outlined.  Currently on lopressor, amlodipine and lisinopril.  Follow pressures.  Follow metabolic panel.  

## 2022-09-05 NOTE — Assessment & Plan Note (Signed)
On crestor.  Low cholesterol diet and exercise. Follow lipid panel and liver function tests.   Lab Results  Component Value Date   CHOL 109 08/31/2022   HDL 38.80 (L) 08/31/2022   LDLCALC 31 08/31/2022   LDLDIRECT 63.0 04/28/2022   TRIG 195.0 (H) 08/31/2022   CHOLHDL 3 08/31/2022

## 2022-09-05 NOTE — Assessment & Plan Note (Signed)
Low carb diet and exercise. Recent a1c 6.8.  On jardiance. Have discussed diet and exercise.  Loose stool resolved on metformin XR.  Saw pharmacy.  Recommended starting ozempic.  Mother with thyroid cancer - deceased.  Have been unable to determine the type of thyroid cancer.  Hold on starting GLP-1 agonist.  Sugars improved.  Hold on making changes.  Follow met b and A1c.

## 2022-09-05 NOTE — Assessment & Plan Note (Signed)
On eliquis.  Continue metoprlol.  No chest pain or sob reported.  Rate controlled, Follow. Has cut back on dose of minoxidil.  Feels better.  Follow.

## 2022-09-15 NOTE — Telephone Encounter (Signed)
Will discuss with patient at husbands upcoming appt and explain how she needs to obtain records

## 2022-09-17 NOTE — Telephone Encounter (Signed)
Discussed with patient. GOIng to get records and have them sent to [place].

## 2022-09-23 ENCOUNTER — Telehealth: Payer: Self-pay | Admitting: Internal Medicine

## 2022-09-23 NOTE — Telephone Encounter (Signed)
Copied from CRM [id number]. Topic: Medicare AWV >> Sep 23, 2022 10:56 AM Payton Doughty wrote: Reason for CRM: LM 09/23/2022 to schedule AWV   Verlee Rossetti; Care Guide Ambulatory Clinical Support [place] [facility] Direct Dial: [phone]

## 2022-10-14 ENCOUNTER — Other Ambulatory Visit: Payer: Self-pay | Admitting: Dermatology

## 2022-10-14 ENCOUNTER — Other Ambulatory Visit: Payer: Self-pay | Admitting: Internal Medicine

## 2022-10-14 DIAGNOSIS — L649 Androgenic alopecia, unspecified: Secondary | ICD-10-CM

## 2022-10-14 DIAGNOSIS — Z1231 Encounter for screening mammogram for malignant neoplasm of breast: Secondary | ICD-10-CM

## 2022-10-26 ENCOUNTER — Other Ambulatory Visit: Payer: Self-pay | Admitting: Pharmacist

## 2022-10-26 ENCOUNTER — Encounter: Payer: Self-pay | Admitting: Internal Medicine

## 2022-10-26 NOTE — Progress Notes (Signed)
Care Coordination Call  While speaking with patient about her husband's patient assistance needs, she mentioned the cost of Eliquis is difficult. Discussed income and out of pocket spend. Appears she would qualify for both Eliquis and Jardiance assistance.   Will collaborate with pharmacy technician team to pursue assistance.   Catie Eppie Gibson, PharmD, BCACP, CPP Clinical Pharmacist [facility] [phone]

## 2022-10-26 NOTE — Telephone Encounter (Signed)
We have been trying to get her moms records (deceased patient of yours) to clarify is she can take ozempic due to family history of thyroid cancer but unsure of what kind. Need to confirm not medullary. Pt was instructed to reach out to medical records to obtain the records since patient is deceased but per message below she is saying that we need to request records. Need to know if we can do this. Per discussion with you and Enyla, [place] needed to request the records and bring them to [place].

## 2022-10-26 NOTE — Patient Instructions (Signed)
April Solis,   We will be mailing you the applications for Eliquis ([employer]) and [employer] ([employer] Adult nurse. We will need 1) copy of your 2023 tax return and 2) proof of how much you AND Fayrene Fearing have spent on prescriptions in 2024.   Please reach out with any questions.   Thanks!  Catie Eppie Gibson, PharmD, BCACP, CPP Clinical Pharmacist [facility] [phone]

## 2022-10-26 NOTE — Telephone Encounter (Signed)
Can you help with this? Thanks

## 2022-11-01 NOTE — Telephone Encounter (Signed)
Sorry Dr. Lorin Picket I just saw this I do not go into patient advice since I am not working in basket unless I am advised. So, I just saw this, but I have called Medical records and ask for assistance I am waiting for call back.

## 2022-11-02 NOTE — Telephone Encounter (Signed)
Spoke with Medical records Gunnar Fusi) she is double checking because the patient is deceased and was not a patient here at time of death we cannot and do not have the right to access the medical record or request. I ask to speak with  a manger and awaiting managers call back to double check, but as before the patient will need to go to the [facility] Website fill out a release of information , show proof of death and show proof of next of kin in order to obtain this information. I advised Medical records that the patient had been mis informed as to her provider could request.The manager wlll notify her team of the correct process for obtaining a deceased patients medical record.

## 2022-11-09 ENCOUNTER — Telehealth: Payer: Self-pay

## 2022-11-09 NOTE — Telephone Encounter (Signed)
Called pt to inform her that her patient assistance meds for ozempic are available for pick up pt stated that scott wants to know what exactly type of thyroid cancer her mom had before letting her take the ozempic  she states she has been working with [facility] records to have them sent and would like to know if Dr. Lorin Picket has received those records,    Pt would like Bethann Berkshire to give her a call once those records have been received and if she can take the Ozempic or not.

## 2022-11-12 NOTE — Telephone Encounter (Signed)
Patient is aware that her moms records did not specify whether she had medullary thyroid cancer. Patient is ok with not starting ozempic. Meds can be logged in PAR book.

## 2022-12-02 ENCOUNTER — Telehealth: Payer: Self-pay | Admitting: *Deleted

## 2022-12-02 DIAGNOSIS — K080 Exfoliation of teeth due to systemic causes: Secondary | ICD-10-CM | POA: Diagnosis not present

## 2022-12-02 NOTE — Telephone Encounter (Signed)
Medication given to [facility] to log in PAR book.

## 2022-12-02 NOTE — Telephone Encounter (Signed)
I spoke with representative Dynesha. I called to discontinue shipments her patient assistance medication-Ozempic.  I was unsuccessful because the phone number in chart for pt & spouse did not match what they have on file. So she was not allowed to go any further.   I explained that even on of her forms scanned in the the number matches what we have however she said that someone completed an online application with a different number.   So I will forward to Catie to see if she can assist.

## 2022-12-08 NOTE — Telephone Encounter (Signed)
Unfortunately, pt will need to be responsible for calling and cancelling assistance. Please have pt call [employer] at [phone], thanks.

## 2022-12-09 ENCOUNTER — Telehealth: Payer: Self-pay

## 2022-12-09 NOTE — Telephone Encounter (Signed)
As discussed confirmed - Ok to give samples we have of ozempic.  Agree with form completion for assistance - ozempic.

## 2022-12-09 NOTE — Telephone Encounter (Signed)
Spoke with patient. They are working on getting her husband approved for ozempic patient assistance. Will be dropping off application for [place] to complete provider portion. Ok to provide her husband with samples from the PAR since she was unable to take her medication? She received 2 shipments that she was unable to take due to family history so they were logged into our samples. Her husband has one injection left and these samples should last until they can get the application process completed for novo.

## 2022-12-09 NOTE — Telephone Encounter (Signed)
[employer] called to confirm cancellation and to check if any adverse reactions occurred because rep is required to report them. Told rep that as far as I could see from existing chart notes, pt wants to confirm that mother didn't have medular thyroid cancer. If pt did experience a reaction, we will need to call [employer] to report. Further deliveries have been cancelled.

## 2022-12-09 NOTE — Telephone Encounter (Signed)
Will mail patient assistance applications for:  Eliquis ([employer]) Jardiance ([employer])  Ozempic enrollment discontinued.

## 2022-12-14 ENCOUNTER — Telehealth: Payer: Self-pay | Admitting: Internal Medicine

## 2022-12-14 NOTE — Telephone Encounter (Signed)
Copied from CRM [id number]. Topic: Medicare AWV >> Dec 14, 2022  2:46 PM Payton Doughty wrote: Reason for CRM: LM 12/14/2022 to R/S AWV appt - [facility]  Verlee Rossetti; Care Guide Ambulatory Clinical Support [place] [facility] Direct Dial: [phone]

## 2022-12-17 ENCOUNTER — Other Ambulatory Visit: Payer: Self-pay | Admitting: Internal Medicine

## 2022-12-28 DIAGNOSIS — K080 Exfoliation of teeth due to systemic causes: Secondary | ICD-10-CM | POA: Diagnosis not present

## 2022-12-30 DIAGNOSIS — H401133 Primary open-angle glaucoma, bilateral, severe stage: Secondary | ICD-10-CM | POA: Diagnosis not present

## 2023-01-04 ENCOUNTER — Ambulatory Visit
Admission: RE | Admit: 2023-01-04 | Discharge: 2023-01-04 | Disposition: A | Discharge status: Home / Self Care | Payer: Medicare Other | Source: Ambulatory Visit | Attending: Internal Medicine | Admitting: Internal Medicine

## 2023-01-04 ENCOUNTER — Other Ambulatory Visit: Payer: Medicare Other

## 2023-01-04 DIAGNOSIS — E1169 Type 2 diabetes mellitus with other specified complication: Secondary | ICD-10-CM | POA: Diagnosis not present

## 2023-01-04 DIAGNOSIS — I10 Essential (primary) hypertension: Secondary | ICD-10-CM | POA: Diagnosis not present

## 2023-01-04 DIAGNOSIS — E785 Hyperlipidemia, unspecified: Secondary | ICD-10-CM | POA: Diagnosis not present

## 2023-01-04 DIAGNOSIS — E1165 Type 2 diabetes mellitus with hyperglycemia: Secondary | ICD-10-CM | POA: Diagnosis not present

## 2023-01-04 DIAGNOSIS — Z7984 Long term (current) use of oral hypoglycemic drugs: Secondary | ICD-10-CM

## 2023-01-04 DIAGNOSIS — Z1231 Encounter for screening mammogram for malignant neoplasm of breast: Secondary | ICD-10-CM | POA: Insufficient documentation

## 2023-01-04 LAB — CBC WITH DIFFERENTIAL/PLATELET
Basophils Absolute: 0.1 10*3/uL (ref 0.0–0.1)
Basophils Relative: 0.8 % (ref 0.0–3.0)
Eosinophils Absolute: 0.3 10*3/uL (ref 0.0–0.7)
Eosinophils Relative: 4.3 % (ref 0.0–5.0)
HCT: 44.1 % (ref 36.0–46.0)
Hemoglobin: 14.6 g/dL (ref 12.0–15.0)
Lymphocytes Relative: 28.2 % (ref 12.0–46.0)
Lymphs Abs: 2 10*3/uL (ref 0.7–4.0)
MCHC: 33.1 g/dL (ref 30.0–36.0)
MCV: 92.3 fL (ref 78.0–100.0)
Monocytes Absolute: 0.8 10*3/uL (ref 0.1–1.0)
Monocytes Relative: 12.1 % — ABNORMAL HIGH (ref 3.0–12.0)
Neutro Abs: 3.8 10*3/uL (ref 1.4–7.7)
Neutrophils Relative %: 54.6 % (ref 43.0–77.0)
Platelets: 220 10*3/uL (ref 150.0–400.0)
RBC: 4.78 Mil/uL (ref 3.87–5.11)
RDW: 13.6 % (ref 11.5–15.5)
WBC: 7 10*3/uL (ref 4.0–10.5)

## 2023-01-04 LAB — BASIC METABOLIC PANEL
BUN: 18 mg/dL (ref 6–23)
CO2: 25 meq/L (ref 19–32)
Calcium: 9.1 mg/dL (ref 8.4–10.5)
Chloride: 104 meq/L (ref 96–112)
Creatinine, Ser: 0.91 mg/dL (ref 0.40–1.20)
GFR: 65.42 mL/min (ref >60.00)
Glucose, Bld: 178 mg/dL — ABNORMAL HIGH (ref 70–99)
Potassium: 4.5 meq/L (ref 3.5–5.1)
Sodium: 138 meq/L (ref 135–145)

## 2023-01-04 LAB — TSH: TSH: 0.96 u[IU]/mL (ref 0.35–5.50)

## 2023-01-04 LAB — HEPATIC FUNCTION PANEL
ALT: 30 U/L (ref 0–35)
AST: 23 U/L (ref 0–37)
Albumin: 4.1 g/dL (ref 3.5–5.2)
Alkaline Phosphatase: 58 U/L (ref 39–117)
Bilirubin, Direct: 0.1 mg/dL (ref 0.0–0.3)
Total Bilirubin: 0.4 mg/dL (ref 0.2–1.2)
Total Protein: 7 g/dL (ref 6.0–8.3)

## 2023-01-04 LAB — LIPID PANEL
Cholesterol: 119 mg/dL (ref 0–200)
HDL: 37.1 mg/dL — ABNORMAL LOW (ref >39.00)
LDL Cholesterol: 28 mg/dL (ref 0–99)
NonHDL: 82.33
Total CHOL/HDL Ratio: 3
Triglycerides: 272 mg/dL — ABNORMAL HIGH (ref 0.0–149.0)
VLDL: 54.4 mg/dL — ABNORMAL HIGH (ref 0.0–40.0)

## 2023-01-04 LAB — HEMOGLOBIN A1C: Hgb A1c MFr Bld: 7.1 % — ABNORMAL HIGH (ref 4.6–6.5)

## 2023-01-05 ENCOUNTER — Other Ambulatory Visit: Payer: Self-pay | Admitting: Internal Medicine

## 2023-01-05 DIAGNOSIS — N6489 Other specified disorders of breast: Secondary | ICD-10-CM

## 2023-01-05 DIAGNOSIS — R928 Other abnormal and inconclusive findings on diagnostic imaging of breast: Secondary | ICD-10-CM

## 2023-01-07 ENCOUNTER — Ambulatory Visit (INDEPENDENT_AMBULATORY_CARE_PROVIDER_SITE_OTHER): Payer: Medicare Other | Admitting: Internal Medicine

## 2023-01-07 ENCOUNTER — Encounter: Payer: Self-pay | Admitting: Internal Medicine

## 2023-01-07 ENCOUNTER — Other Ambulatory Visit (HOSPITAL_COMMUNITY)
Admission: RE | Admit: 2023-01-07 | Discharge: 2023-01-07 | Disposition: A | Discharge status: Home / Self Care | Payer: Medicare Other | Source: Ambulatory Visit | Attending: Internal Medicine | Admitting: Internal Medicine

## 2023-01-07 VITALS — BP 112/70 | HR 86 | Temp 98.0°F | Resp 16 | Ht 62.0 in | Wt 168.0 lb

## 2023-01-07 DIAGNOSIS — I10 Essential (primary) hypertension: Secondary | ICD-10-CM | POA: Diagnosis not present

## 2023-01-07 DIAGNOSIS — E1165 Type 2 diabetes mellitus with hyperglycemia: Secondary | ICD-10-CM

## 2023-01-07 DIAGNOSIS — F439 Reaction to severe stress, unspecified: Secondary | ICD-10-CM

## 2023-01-07 DIAGNOSIS — E1159 Type 2 diabetes mellitus with other circulatory complications: Secondary | ICD-10-CM

## 2023-01-07 DIAGNOSIS — Z0000 Encounter for general adult medical examination without abnormal findings: Secondary | ICD-10-CM | POA: Diagnosis not present

## 2023-01-07 DIAGNOSIS — N898 Other specified noninflammatory disorders of vagina: Secondary | ICD-10-CM | POA: Diagnosis not present

## 2023-01-07 DIAGNOSIS — I480 Paroxysmal atrial fibrillation: Secondary | ICD-10-CM

## 2023-01-07 DIAGNOSIS — E1169 Type 2 diabetes mellitus with other specified complication: Secondary | ICD-10-CM | POA: Diagnosis not present

## 2023-01-07 DIAGNOSIS — E785 Hyperlipidemia, unspecified: Secondary | ICD-10-CM

## 2023-01-07 DIAGNOSIS — I152 Hypertension secondary to endocrine disorders: Secondary | ICD-10-CM

## 2023-01-07 MED ORDER — NYSTATIN 100000 UNIT/GM EX CREA: 1.0000 | TOPICAL_CREAM | Freq: Two times a day (BID) | CUTANEOUS | 0 refills | Status: DC

## 2023-01-07 NOTE — Progress Notes (Unsigned)
Subjective:    [name], female    DOB: February 15, 1956, 67 y.o.   MRN: [id number]  Patient here for  Chief Complaint  Patient presents with   Annual Exam    HPI Here for a physical exam. Currently on jardiance and metformin. Reviewed outside sugars.  Most averaging 130s-150s.  She is trying to watch her diet.  Discussed diet and exercise.  A1c 7.1.  Discussed additional medication.  She prefers to work on diet and exercise and hold on adding medication.  No chest pain or sob reported.  No abdominal pain or bowel change reported.  Had eye exam 9/26.  Has appt with GI 04/2023.  Saw her dentist 9/24. Does report vaginal irritation and burning.  Symptoms started over the past week.    Past Medical History:  Diagnosis Date   Achilles rupture, right    Diabetes mellitus without complication (HCC)    Environmental allergies    Glaucoma    History of chicken pox    Hyperlipidemia    Hypertension    Past Surgical History:  Procedure Laterality Date   BREAST BIOPSY Right 1999   neg   COLONOSCOPY WITH PROPOFOL N/A 12/12/2017   Procedure: COLONOSCOPY WITH PROPOFOL;  Surgeon: Scot Jun, MD;  Location: [facility] ENDOSCOPY;  Service: Endoscopy;  Laterality: N/A;   Family History  Problem Relation Age of Onset   Colon polyps Mother    Alzheimer's disease Mother    Heart attack Father 71   Hypertension Father    Diabetes Father    Hypertension Sister    Diabetes Sister        x2   COPD Sister    Diabetes Sister    Hypertension Sister    Glaucoma Sister    Arrhythmia Son        Faster heart beat than normal   Breast cancer Neg Hx    Colon cancer Neg Hx    Social History   Socioeconomic History   Marital status: Married    Spouse name: Not on file   Number of children: 3   Years of education: Not on file   Highest education level: Not on file  Occupational History    Employer: [employer]  Tobacco Use   Smoking status: Never   Smokeless tobacco: Never   Vaping Use   Vaping status: Never Used  Substance and Sexual Activity   Alcohol use: No    Alcohol/week: 0.0 standard drinks of alcohol   Drug use: No   Sexual activity: Not on file  Other Topics Concern   Not on file  Social History Narrative   Not on file   Social Determinants of Health   Financial Resource Strain: Patient Declined (06/22/2022)   Overall Financial Resource Strain (CARDIA)    Difficulty of Paying Living Expenses: Patient declined  Food Insecurity: Patient Declined (06/22/2022)   Hunger Vital Sign    Worried About Running Out of Food in the Last Year: Patient declined    Ran Out of Food in the Last Year: Patient declined  Transportation Needs: No Transportation Needs (06/22/2022)   PRAPARE - Administrator, Civil Service (Medical): No    Lack of Transportation (Non-Medical): No  Physical Activity: Unknown (06/22/2022)   Exercise Vital Sign    Days of Exercise per Week: Patient declined    Minutes of Exercise per Session: Not on file  Stress: Patient Declined (06/22/2022)   [employer] of Occupational Health -  Occupational Stress Questionnaire    Feeling of Stress : Patient declined  Social Connections: Unknown (06/22/2022)   Social Connection and Isolation Panel [NHANES]    Frequency of Communication with Friends and Family: Patient declined    Frequency of Social Gatherings with Friends and Family: Patient declined    Attends Religious Services: Patient declined    Database administrator or Organizations: Yes    Attends Engineer, structural: More than 4 times per year    Marital Status: Patient declined     Review of Systems  Constitutional:  Negative for appetite change and unexpected weight change.  HENT:  Negative for congestion, sinus pressure and sore throat.   Eyes:  Negative for pain and visual disturbance.  Respiratory:  Negative for cough, chest tightness and shortness of breath.   Cardiovascular:  Negative for chest  pain and palpitations.  Gastrointestinal:  Negative for abdominal pain, diarrhea, nausea and vomiting.  Genitourinary:  Negative for difficulty urinating and dysuria.  Musculoskeletal:  Negative for joint swelling and myalgias.  Skin:  Negative for color change and rash.  Neurological:  Negative for dizziness and headaches.  Hematological:  Negative for adenopathy. Does not bruise/bleed easily.  Psychiatric/Behavioral:  Negative for agitation and dysphoric mood.        Objective:     BP 112/70   Pulse 86   Temp 98 F (36.7 C)   Resp 16   Ht 5\' 2"  (1.575 m)   Wt 168 lb (76.2 kg)   LMP 02/26/2008   SpO2 98%   BMI 30.73 kg/m  Wt Readings from Last 3 Encounters:  01/07/23 168 lb (76.2 kg)  09/02/22 171 lb (77.6 kg)  06/23/22 163 lb 3.2 oz (74 kg)    Physical Exam Vitals reviewed.  Constitutional:      General: She is not in acute distress.    Appearance: Normal appearance. She is well-developed.  HENT:     Head: Normocephalic and atraumatic.     Right Ear: External ear normal.     Left Ear: External ear normal.  Eyes:     General: No scleral icterus.       Right eye: No discharge.        Left eye: No discharge.     Conjunctiva/sclera: Conjunctivae normal.  Neck:     Thyroid: No thyromegaly.  Cardiovascular:     Rate and Rhythm: Normal rate and regular rhythm.  Pulmonary:     Effort: No tachypnea, accessory muscle usage or respiratory distress.     Breath sounds: Normal breath sounds. No decreased breath sounds or wheezing.  Chest:  Breasts:    Right: No inverted nipple, mass, nipple discharge or tenderness (no axillary adenopathy).     Left: No inverted nipple, mass, nipple discharge or tenderness (no axilarry adenopathy).  Abdominal:     General: Bowel sounds are normal.     Palpations: Abdomen is soft.     Tenderness: There is no abdominal tenderness.  Genitourinary:    Comments: Normal external genitalia. Erythema - peri vaginal region.  Vaginal vault  without lesions.  KOH/wet prep obtained. Could not appreciate any adnexal masses or tenderness.   Musculoskeletal:        General: No swelling or tenderness.     Cervical back: Neck supple.  Lymphadenopathy:     Cervical: No cervical adenopathy.  Skin:    Findings: No erythema or rash.  Neurological:     Mental Status: She is alert and oriented  to person, place, and time.  Psychiatric:        Mood and Affect: Mood normal.        Behavior: Behavior normal.      Outpatient Encounter Medications as of 01/07/2023  Medication Sig   nystatin cream (MYCOSTATIN) Apply 1 Application topically 2 (two) times daily.   amLODipine (NORVASC) 5 MG tablet Take 1 tablet (5 mg total) by mouth daily.   apixaban (ELIQUIS) 5 MG TABS tablet Take 1 tablet (5 mg total) by mouth 2 (two) times daily.   dorzolamide-timolol (COSOPT) 22.3-6.8 MG/ML ophthalmic solution Place 1 drop into both eyes 2 (two) times daily.   doxycycline (ADOXA) 50 MG tablet Take 1-2 tabs PO QD PRN flares   empagliflozin (JARDIANCE) 25 MG TABS tablet TAKE 1 TABLET BY MOUTH ONCE DAILY BEFOREBREAKFAST   finasteride (PROSCAR) 5 MG tablet TAKE 1 TABLET BY MOUTH DAILY   Fluocinolone Acetonide Scalp 0.01 % OIL Apply to aa's scalp QD PRN flares.   GE100 BLOOD GLUCOSE TEST test strip TEST TWICE DAILY   ketoconazole (NIZORAL) 2 % shampoo Shampoo into the scalp let sit several minutes then wash out. Use 2-3 days per week.   lisinopril (ZESTRIL) 10 MG tablet Take 1 tablet (10 mg total) by mouth daily.   metFORMIN (GLUCOPHAGE-XR) 500 MG 24 hr tablet Take 2 tablets (1,000 mg total) by mouth in the morning and at bedtime.   metoprolol tartrate (LOPRESSOR) 25 MG tablet Take 1 tablet (25 mg total) by mouth 2 (two) times daily.   minoxidil (LONITEN) 2.5 MG tablet TAKE 1 TABLET BY MOUTH DAILY   Multiple Vitamin (MULTIVITAMIN) tablet Take 1 tablet by mouth daily.   rosuvastatin (CRESTOR) 10 MG tablet TAKE ONE TABLET EVERY DAY   timolol (BETIMOL) 0.25 %  ophthalmic solution Place 1-2 drops into both eyes daily.   triamcinolone cream (KENALOG) 0.1 % APPLY TO THE AFFECTED AREA DAILY AS NEEDED   vitamin E 200 UNIT capsule Take 200 Units by mouth daily.   [DISCONTINUED] nystatin cream (MYCOSTATIN) Apply 1 application topically 2 (two) times daily.   No facility-administered encounter medications on file as of 01/07/2023.     Lab Results  Component Value Date   WBC 7.0 01/04/2023   HGB 14.6 01/04/2023   HCT 44.1 01/04/2023   PLT 220.0 01/04/2023   GLUCOSE 178 (H) 01/04/2023   CHOL 119 01/04/2023   TRIG 272.0 (H) 01/04/2023   HDL 37.10 (L) 01/04/2023   LDLDIRECT 63.0 04/28/2022   LDLCALC 28 01/04/2023   ALT 30 01/04/2023   AST 23 01/04/2023   NA 138 01/04/2023   K 4.5 01/04/2023   CL 104 01/04/2023   CREATININE 0.91 01/04/2023   BUN 18 01/04/2023   CO2 25 01/04/2023   TSH 0.96 01/04/2023   HGBA1C 7.1 (H) 01/04/2023   MICROALBUR <0.7 08/31/2022    MM 3D SCREENING MAMMOGRAM BILATERAL BREAST  Result Date: 01/05/2023 CLINICAL DATA:  Screening. EXAM: DIGITAL SCREENING BILATERAL MAMMOGRAM WITH TOMOSYNTHESIS AND CAD TECHNIQUE: Bilateral screening digital craniocaudal and mediolateral oblique mammograms were obtained. Bilateral screening digital breast tomosynthesis was performed. The images were evaluated with computer-aided detection. COMPARISON:  Previous exam(s). ACR Breast Density Category c: The breasts are heterogeneously dense, which may obscure small masses. FINDINGS: In the left breast, 2 asymmetries further evaluation. In the right breast, no findings suspicious for malignancy. IMPRESSION: Further evaluation is suggested for 2 asymmetries in the left breast. RECOMMENDATION: Diagnostic mammogram and possibly ultrasound of the left breast. (Code:[id number]-L-32M) The patient will  be contacted regarding the findings, and additional imaging will be scheduled. BI-RADS CATEGORY  0: Incomplete: Need additional imaging evaluation. Electronically  Signed   By: Frederico Hamman M.D.   On: 01/05/2023 13:01       Assessment & Plan:  Routine general medical examination at a health care facility  Hyperlipidemia associated with type 2 diabetes mellitus (HCC) Assessment & Plan: On crestor.  Low cholesterol diet and exercise. Follow lipid panel and liver function tests.   Lab Results  Component Value Date   CHOL 119 01/04/2023   HDL 37.10 (L) 01/04/2023   LDLCALC 28 01/04/2023   LDLDIRECT 63.0 04/28/2022   TRIG 272.0 (H) 01/04/2023   CHOLHDL 3 01/04/2023    Orders: -     Lipid panel; Future -     Hepatic function panel; Future  Type 2 diabetes mellitus with hyperglycemia, without long-term current use of insulin (HCC) Assessment & Plan: Low carb diet and exercise. Recent a1c 7.1.  On jardiance and metformin. Have discussed diet and exercise.  Saw pharmacy.  Recommended starting ozempic.  Mother with thyroid cancer - deceased.  Have been unable to determine the type of thyroid cancer.  Hold on starting GLP-1 agonist.  Sugars as outlined. She wants to hold on making changes/adding medication.  Wants to work in diet and exercise. Follow met b and A1c.   Orders: -     Hemoglobin A1c; Future  Essential hypertension -     Basic metabolic panel; Future  Health care maintenance Assessment & Plan: Physical today 01/07/23.  Mammogram 12/29/21 - Birads I.  Scheduled for mammogram 01/18/23. Colonosocpy 12/2017 - as outlined.    Vaginal itching -     Cervicovaginal ancillary only  Hypertension associated with type 2 diabetes mellitus (HCC) Assessment & Plan: Blood pressure as outlined.  Currently on lopressor, amlodipine and lisinopril.  Follow pressures.  Follow metabolic panel.    PAF (paroxysmal atrial fibrillation) (HCC) Assessment & Plan: On eliquis.  Continue metoprlol.  No chest pain or sob reported.  Rate controlled. Feels better.  Follow.    Stress Assessment & Plan: Overall appears to be handling stress relatively  well.  Follow.    Vaginal irritation Assessment & Plan: Erythema - peri vaginal area.  KOH/wet prep obtained.  Follow.    Other orders -     Nystatin; Apply 1 Application topically 2 (two) times daily.  Dispense: 30 g; Refill: 0     Dale [place], MD

## 2023-01-07 NOTE — Assessment & Plan Note (Signed)
Physical today 01/07/23.  Mammogram 12/29/21 - Birads I.  colonosocpy 12/2017 - as outlined.

## 2023-01-09 DIAGNOSIS — N898 Other specified noninflammatory disorders of vagina: Secondary | ICD-10-CM | POA: Insufficient documentation

## 2023-01-09 NOTE — Assessment & Plan Note (Signed)
Overall appears to be handling stress relatively well.  Follow.  

## 2023-01-09 NOTE — Assessment & Plan Note (Signed)
Erythema - peri vaginal area.  KOH/wet prep obtained.  Follow.

## 2023-01-09 NOTE — Assessment & Plan Note (Signed)
On crestor.  Low cholesterol diet and exercise. Follow lipid panel and liver function tests.   Lab Results  Component Value Date   CHOL 119 01/04/2023   HDL 37.10 (L) 01/04/2023   LDLCALC 28 01/04/2023   LDLDIRECT 63.0 04/28/2022   TRIG 272.0 (H) 01/04/2023   CHOLHDL 3 01/04/2023

## 2023-01-09 NOTE — Assessment & Plan Note (Signed)
On eliquis.  Continue metoprlol.  No chest pain or sob reported.  Rate controlled. Feels better.  Follow.

## 2023-01-09 NOTE — Assessment & Plan Note (Signed)
Blood pressure as outlined.  Currently on lopressor, amlodipine and lisinopril.  Follow pressures.  Follow metabolic panel.  

## 2023-01-09 NOTE — Assessment & Plan Note (Signed)
Low carb diet and exercise. Recent a1c 7.1.  On jardiance and metformin. Have discussed diet and exercise.  Saw pharmacy.  Recommended starting ozempic.  Mother with thyroid cancer - deceased.  Have been unable to determine the type of thyroid cancer.  Hold on starting GLP-1 agonist.  Sugars as outlined. She wants to hold on making changes/adding medication.  Wants to work in diet and exercise. Follow met b and A1c.

## 2023-01-10 LAB — CERVICOVAGINAL ANCILLARY ONLY
Bacterial Vaginitis (gardnerella): NEGATIVE
Candida Glabrata: NEGATIVE
Candida Vaginitis: POSITIVE — AB
Comment: NEGATIVE
Comment: NEGATIVE
Comment: NEGATIVE

## 2023-01-16 ENCOUNTER — Other Ambulatory Visit: Payer: Self-pay | Admitting: Dermatology

## 2023-01-16 DIAGNOSIS — L649 Androgenic alopecia, unspecified: Secondary | ICD-10-CM

## 2023-01-18 ENCOUNTER — Ambulatory Visit
Admission: RE | Admit: 2023-01-18 | Discharge: 2023-01-18 | Disposition: A | Discharge status: Home / Self Care | Payer: Medicare Other | Source: Ambulatory Visit | Attending: Internal Medicine | Admitting: Internal Medicine

## 2023-01-18 DIAGNOSIS — R92333 Mammographic heterogeneous density, bilateral breasts: Secondary | ICD-10-CM | POA: Diagnosis not present

## 2023-01-18 DIAGNOSIS — R928 Other abnormal and inconclusive findings on diagnostic imaging of breast: Secondary | ICD-10-CM | POA: Insufficient documentation

## 2023-01-18 DIAGNOSIS — N6489 Other specified disorders of breast: Secondary | ICD-10-CM

## 2023-01-25 ENCOUNTER — Ambulatory Visit: Payer: Medicare Other | Admitting: Dermatology

## 2023-01-25 DIAGNOSIS — W908XXA Exposure to other nonionizing radiation, initial encounter: Secondary | ICD-10-CM | POA: Diagnosis not present

## 2023-01-25 DIAGNOSIS — L649 Androgenic alopecia, unspecified: Secondary | ICD-10-CM

## 2023-01-25 DIAGNOSIS — L570 Actinic keratosis: Secondary | ICD-10-CM | POA: Diagnosis not present

## 2023-01-25 DIAGNOSIS — L578 Other skin changes due to chronic exposure to nonionizing radiation: Secondary | ICD-10-CM

## 2023-01-25 NOTE — Patient Instructions (Addendum)

## 2023-01-25 NOTE — Progress Notes (Signed)
Follow-Up Visit   Subjective  April Solis is a 68 y.o. female who presents for the following:  Red spots at forehead noticed 3 - 4 months  Follow up on alopecia, currently taking finasteride and minoxidil for 1 year, been on full tab of each for past 6 months. Still has some hair shedding.  No recent illness or surgeries.   The following portions of the chart were reviewed this encounter and updated as appropriate: medications, allergies, medical history  Review of Systems:  No other skin or systemic complaints except as noted in HPI or Assessment and Plan.  Objective  Well appearing patient in no apparent distress; mood and affect are within normal limits.   A focused examination was performed of the following areas: Scalp and face   Relevant exam findings are noted in the Assessment and Plan.  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   Chronic and persistent condition with duration or expected duration over one year. Condition is symptomatic/ bothersome to patient. Not currently at goal, but improving.     Female Androgenic Alopecia is a chronic condition related to genetics and/or hormonal changes.  In women androgenetic alopecia is commonly associated with menopause but may occur any time after puberty.  It causes hair thinning primarily on the crown with widening of the part and temporal hairline recession.  Can use OTC Rogaine (minoxidil) 5% solution/foam as directed.  Oral treatments in female patients who have no contraindication may include : - Low dose oral minoxidil 1.25 - 5mg  daily - Spironolactone 50 - 100mg  bid - Finasteride 2.5 - 5 mg daily Adjunctive therapies  include: - Low Level Laser Light Therapy (LLLT) - Platelet-rich plasma injections (PRP) - Hair Transplants or scalp reduction    Treatment Plan: BP 97/63  Continue finasteride 5 mg 1 tablet once daily Continue minoxidil 2.5 mg  1  tablet once daily   Doses of minoxidil for hair loss are considered 'low dose'. This is because the doses used for hair loss are a lot lower than the doses which are used for conditions such as high blood pressure (hypertension). The doses used for hypertension are 10-40mg  per day.  Side effects are uncommon at the low doses (up to 2.5 mg/day) used to treat hair loss. Potential side effects, more commonly seen at higher doses, include: Increase in hair growth (hypertrichosis) elsewhere on face and body Temporary hair shedding upon starting medication which may last up to 4 weeks Ankle swelling, fluid retention, rapid weight gain more than 5 pounds Low blood pressure and feeling lightheaded or dizzy when standing up quickly Fast or irregular heartbeat Headaches   ACTINIC DAMAGE - chronic, secondary to cumulative UV radiation exposure/sun exposure over time - diffuse scaly erythematous macules with underlying dyspigmentation - Recommend daily broad spectrum sunscreen SPF 30+ to sun-exposed areas, reapply every 2 hours as needed.  - Recommend staying in the shade or wearing long sleeves, sun glasses (UVA+UVB protection) and wide brim hats (4-inch brim around the entire circumference of the hat). - Call for new or changing lesions.  Actinic keratosis Head - Anterior (Face)  Early AK vs ISK x 2 at forehead  Pt deferred cryotherapy treatment today,  will  treat at next visit if still present   Actinic keratoses are precancerous spots that appear secondary to cumulative UV radiation exposure/sun exposure over time. They are chronic with expected duration over 1 year. A portion of actinic keratoses will progress to squamous cell carcinoma of the skin. It is not  possible to reliably predict which spots will progress to skin cancer and so treatment is recommended to prevent development of skin cancer.  Recommend daily broad spectrum sunscreen SPF 30+ to sun-exposed areas, reapply every 2 hours as needed.  Recommend staying in the shade or wearing long sleeves, sun glasses (UVA+UVB protection) and wide brim hats (4-inch brim around the entire circumference of the hat). Call for new or changing lesions.  Androgenic alopecia  Related Medications minoxidil (LONITEN) 2.5 MG tablet TAKE 1 TABLET BY MOUTH DAILY  finasteride (PROSCAR) 5 MG tablet TAKE 1 TABLET BY MOUTH DAILY    Return in about 6 months (around 07/26/2023) for alopecia .  I, Asher Muir, CMA, am acting as scribe for Willeen Niece, MD.   Documentation: I have reviewed the above documentation for accuracy and completeness, and I agree with the above.  Willeen Niece, MD

## 2023-01-26 NOTE — Telephone Encounter (Signed)
Rec'd completed patient pages for BMS application (eliquis).   Faxed provider pg to Dr. Lorin Picket

## 2023-01-31 NOTE — Progress Notes (Unsigned)
Cardiology Office Note    Date:  02/01/2023   ID:  April, Solis January 23, 1956, MRN [id number]  PCP:  Dale Mecca, MD  Cardiologist:  Yvonne Kendall, MD  Electrophysiologist:  None   Chief Complaint: Follow up  History of Present Illness:   April Solis is a 67 y.o. female with history of PAF, HTN, HLD, DM 2, glaucoma, and environmental allergies presents for follow-up of A-fib.  Echo in 03/2021 showed an EF of 60 to 65%, no regional wall motion abnormalities, normal LV diastolic function parameters, normal RV systolic function and ventricular cavity size, mild mitral regurgitation, and an estimated right atrial pressure of 3 mmHg.  In the setting of a single episode of chest pain she underwent CTA in 05/2021 showed a calcium score of 0 with no evidence of CAD, and no other significant incidental findings.  She was last seen in the office in 01/2022 and was without symptoms of angina or cardiac decompensation.  Her Apple Watch indicated A-fib burden of approximately 10% during September and the first week of October 2023.  EKG showed sinus rhythm.  She comes in doing well from a cardiac perspective and is without symptoms of angina or cardiac decompensation.  No significant palpitations, dizziness, presyncope, or syncope.  Reports A-fib burden by her Apple Watch is variable based on her work schedule and is overall pleased with her current level of A-fib control.  No lower extremity swelling, orthopnea, or early satiety.  No falls, hematochezia, or melena.  Adherent and tolerating cardiac medications without issues.  Does not have any acute cardiac concerns at this time.   Labs independently reviewed: 01/2023 - Hgb 14.6, PLT 220, potassium 4.5, BUN 18, serum creatinine 0.91, albumin 4.1, AST/ALT normal, A1c 7.1, TC 119, TG 272, HDL 37, LDL 28, TSH normal  Past Medical History:  Diagnosis Date   Achilles rupture, right    Diabetes mellitus without complication  (HCC)    Environmental allergies    Glaucoma    History of chicken pox    Hyperlipidemia    Hypertension    Paroxysmal A-fib (HCC)     Past Surgical History:  Procedure Laterality Date   BREAST BIOPSY Right 1999   neg   COLONOSCOPY WITH PROPOFOL N/A 12/12/2017   Procedure: COLONOSCOPY WITH PROPOFOL;  Surgeon: Scot Jun, MD;  Location: [facility] ENDOSCOPY;  Service: Endoscopy;  Laterality: N/A;    Current Medications: Current Meds  Medication Sig   amLODipine (NORVASC) 5 MG tablet Take 1 tablet (5 mg total) by mouth daily.   apixaban (ELIQUIS) 5 MG TABS tablet Take 1 tablet (5 mg total) by mouth 2 (two) times daily.   dorzolamide-timolol (COSOPT) 22.3-6.8 MG/ML ophthalmic solution Place 1 drop into both eyes 2 (two) times daily.   doxycycline (ADOXA) 50 MG tablet Take 1-2 tabs PO QD PRN flares   empagliflozin (JARDIANCE) 25 MG TABS tablet TAKE 1 TABLET BY MOUTH ONCE DAILY BEFOREBREAKFAST   finasteride (PROSCAR) 5 MG tablet TAKE 1 TABLET BY MOUTH DAILY   Fluocinolone Acetonide Scalp 0.01 % OIL Apply to aa's scalp QD PRN flares.   GE100 BLOOD GLUCOSE TEST test strip TEST TWICE DAILY   ketoconazole (NIZORAL) 2 % shampoo Shampoo into the scalp let sit several minutes then wash out. Use 2-3 days per week.   lisinopril (ZESTRIL) 10 MG tablet Take 1 tablet (10 mg total) by mouth daily.   metFORMIN (GLUCOPHAGE-XR) 500 MG 24 hr tablet Take 2 tablets (1,000 mg total)  by mouth in the morning and at bedtime.   metoprolol tartrate (LOPRESSOR) 25 MG tablet Take 1 tablet (25 mg total) by mouth 2 (two) times daily.   minoxidil (LONITEN) 2.5 MG tablet TAKE 1 TABLET BY MOUTH DAILY   Multiple Vitamin (MULTIVITAMIN) tablet Take 1 tablet by mouth daily.   nystatin cream (MYCOSTATIN) Apply 1 Application topically 2 (two) times daily.   rosuvastatin (CRESTOR) 10 MG tablet TAKE ONE TABLET EVERY DAY   timolol (BETIMOL) 0.25 % ophthalmic solution Place 1-2 drops into both eyes daily.   triamcinolone cream  (KENALOG) 0.1 % APPLY TO THE AFFECTED AREA DAILY AS NEEDED   vitamin E 200 UNIT capsule Take 200 Units by mouth daily.    Allergies:   Influenza vaccines, Rubbing alcohol [alcohol], and Other   Social History   Socioeconomic History   Marital status: Married    Spouse name: Not on file   Number of children: 3   Years of education: Not on file   Highest education level: Not on file  Occupational History    Employer: [employer]  Tobacco Use   Smoking status: Never   Smokeless tobacco: Never  Vaping Use   Vaping status: Never Used  Substance and Sexual Activity   Alcohol use: No    Alcohol/week: 0.0 standard drinks of alcohol   Drug use: No   Sexual activity: Not on file  Other Topics Concern   Not on file  Social History Narrative   Not on file   Social Determinants of Health   Financial Resource Strain: Patient Declined (06/22/2022)   Overall Financial Resource Strain (CARDIA)    Difficulty of Paying Living Expenses: Patient declined  Food Insecurity: Patient Declined (06/22/2022)   Hunger Vital Sign    Worried About Running Out of Food in the Last Year: Patient declined    Ran Out of Food in the Last Year: Patient declined  Transportation Needs: No Transportation Needs (06/22/2022)   PRAPARE - Administrator, Civil Service (Medical): No    Lack of Transportation (Non-Medical): No  Physical Activity: Unknown (06/22/2022)   Exercise Vital Sign    Days of Exercise per Week: Patient declined    Minutes of Exercise per Session: Not on file  Stress: Patient Declined (06/22/2022)   [employer] of Occupational Health - Occupational Stress Questionnaire    Feeling of Stress : Patient declined  Social Connections: Unknown (06/22/2022)   Social Connection and Isolation Panel [NHANES]    Frequency of Communication with Friends and Family: Patient declined    Frequency of Social Gatherings with Friends and Family: Patient declined    Attends Religious Services: Patient  declined    Database administrator or Organizations: Yes    Attends Engineer, structural: More than 4 times per year    Marital Status: Patient declined     Family History:  The patient's family history includes Alzheimer's disease in her mother; Arrhythmia in her son; COPD in her sister; Colon polyps in her mother; Diabetes in her father, sister, and sister; Glaucoma in her sister; Heart attack (age of onset: 3) in her father; Hypertension in her father, sister, and sister. There is no history of Breast cancer or Colon cancer.  ROS:   12-point review of systems is negative unless otherwise noted in the HPI.   EKGs/Labs/Other Studies Reviewed:    Studies reviewed were summarized above. The additional studies were reviewed today:  Coronary CTA 05/28/2021: FINDINGS: Aorta: Normal size. Minimal  aortic root calcifications. No dissection.   Aortic Valve:  Trileaflet.  No calcifications.   Coronary Arteries:  Normal coronary origin.  Right dominance.   RCA is a dominant artery that gives rise to PDA and PLA. There is no plaque.   Left main gives rise to LAD and LCX arteries. There is no LM disease.   LAD has no plaque.   LCX is a non-dominant artery that gives rise one OM1 branch. There is no plaque.   Other findings:   Normal pulmonary vein drainage into the left atrium.   Normal left atrial appendage without a thrombus.   Normal size of the pulmonary artery.   IMPRESSION: 1. Normal coronary calcium score of 0. Patient is low risk for coronary events. 2.  Normal coronary origin with right dominance. 3.  No evidence of CAD. 4.  CAD-RADS 0.  Consider non-atherosclerotic causes of chest pain. __________  2D echo 03/12/2021: 1. Left ventricular ejection fraction, by estimation, is 60 to 65%. The  left ventricle has normal function. The left ventricle has no regional  wall motion abnormalities. Left ventricular diastolic parameters were  normal. The average left  ventricular  global longitudinal strain is -20.5 %. The global longitudinal strain is  normal.   2. Right ventricular systolic function is normal. The right ventricular  size is normal. Tricuspid regurgitation signal is inadequate for assessing  PA pressure.   3. The mitral valve is normal in structure. Mild mitral valve  regurgitation. No evidence of mitral stenosis.   4. The aortic valve is normal in structure. Aortic valve regurgitation is  not visualized. No aortic stenosis is present.   5. The inferior vena cava is normal in size with greater than 50%  respiratory variability, suggesting right atrial pressure of 3 mmHg.    EKG:  EKG is ordered today.  The EKG ordered today demonstrates NSR, 65 bpm, no acute ST/T changes  Recent Labs: 01/04/2023: ALT 30; BUN 18; Creatinine, Ser 0.91; Hemoglobin 14.6; Platelets 220.0; Potassium 4.5; Sodium 138; TSH 0.96  Recent Lipid Panel    Component Value Date/Time   CHOL 119 01/04/2023 0719   CHOL 150 12/26/2012 0650   TRIG 272.0 (H) 01/04/2023 0719   TRIG 132 12/26/2012 0650   HDL 37.10 (L) 01/04/2023 0719   HDL 40 12/26/2012 0650   CHOLHDL 3 01/04/2023 0719   VLDL 54.4 (H) 01/04/2023 0719   VLDL 26 12/26/2012 0650   LDLCALC 28 01/04/2023 0719   LDLCALC 84 12/26/2012 0650   LDLDIRECT 63.0 04/28/2022 0909    PHYSICAL EXAM:    VS:  BP 100/60 (BP Location: Left Arm, Patient Position: Sitting, Cuff Size: Normal)   Pulse 65   Ht 5\' 2"  (1.575 m)   Wt 165 lb 12.8 oz (75.2 kg)   LMP 02/26/2008   SpO2 97%   BMI 30.33 kg/m   BMI: Body mass index is 30.33 kg/m.  Physical Exam Vitals reviewed.  Constitutional:      Appearance: She is well-developed.  HENT:     Head: Normocephalic and atraumatic.  Eyes:     General:        Right eye: No discharge.        Left eye: No discharge.  Neck:     Vascular: No JVD.  Cardiovascular:     Rate and Rhythm: Normal rate and regular rhythm.     Heart sounds: Normal heart sounds, S1 normal and  S2 normal. Heart sounds not distant. No midsystolic click and  no opening snap. No murmur heard.    No friction rub.  Pulmonary:     Effort: Pulmonary effort is normal. No respiratory distress.     Breath sounds: Normal breath sounds. No decreased breath sounds, wheezing, rhonchi or rales.  Chest:     Chest wall: No tenderness.  Abdominal:     General: There is no distension.  Musculoskeletal:     Cervical back: Normal range of motion.     Right lower leg: No edema.     Left lower leg: No edema.  Skin:    General: Skin is warm and dry.     Nails: There is no clubbing.  Neurological:     Mental Status: She is alert and oriented to person, place, and time.  Psychiatric:        Speech: Speech normal.        Behavior: Behavior normal.        Thought Content: Thought content normal.        Judgment: Judgment normal.     Wt Readings from Last 3 Encounters:  02/01/23 165 lb 12.8 oz (75.2 kg)  01/07/23 168 lb (76.2 kg)  09/02/22 171 lb (77.6 kg)     ASSESSMENT & PLAN:   PAF: Maintaining sinus rhythm on metoprolol to tartrate 25 mg twice daily.  She does note a fluctuating A-fib burden based on her work schedule, though is completely asymptomatic with this.  In this setting, we will defer escalation of pharmacotherapy.  CHA2DS2-VASc at least 4 (HTN, age x 1, DM, sex category).  Remains on apixaban 5 mg twice daily and does not meet reduced dosing criteria.  No falls or symptoms concerning for bleeding.  Recent labs stable.  HTN: Blood pressure is well-controlled in the office today.  She remains on lisinopril and metoprolol tartrate.  HLD: LDL 28 in 01/2023.  Remains on rosuvastatin 10 mg.  DM2: A1c 7.1.  Managed by PCP.    Disposition: F/u with Dr. Okey Dupre or an APP in 12 months.   Medication Adjustments/Labs and Tests Ordered: Current medicines are reviewed at length with the patient today.  Concerns regarding medicines are outlined above. Medication changes, Labs and Tests  ordered today are summarized above and listed in the Patient Instructions accessible in Encounters.   Signed, Eula Listen, PA-C 02/01/2023 10:10 AM      [facility] [place] [address] [place], [place] [address] [phone]

## 2023-02-01 ENCOUNTER — Encounter: Payer: Self-pay | Admitting: Physician Assistant

## 2023-02-01 ENCOUNTER — Ambulatory Visit: Payer: Medicare Other | Attending: Physician Assistant | Admitting: Physician Assistant

## 2023-02-01 VITALS — BP 100/60 | HR 65 | Ht 62.0 in | Wt 165.8 lb

## 2023-02-01 DIAGNOSIS — E785 Hyperlipidemia, unspecified: Secondary | ICD-10-CM

## 2023-02-01 DIAGNOSIS — I10 Essential (primary) hypertension: Secondary | ICD-10-CM | POA: Diagnosis not present

## 2023-02-01 DIAGNOSIS — E1169 Type 2 diabetes mellitus with other specified complication: Secondary | ICD-10-CM | POA: Diagnosis not present

## 2023-02-01 DIAGNOSIS — I480 Paroxysmal atrial fibrillation: Secondary | ICD-10-CM

## 2023-02-01 NOTE — Patient Instructions (Signed)
Medication Instructions:  Your Physician recommend you continue on your current medication as directed.    *If you need a refill on your cardiac medications before your next appointment, please call your pharmacy*  Lab Work: None ordered If you have labs (blood work) drawn today and your tests are completely normal, you will receive your results only by: MyChart Message (if you have MyChart) OR A paper copy in the mail If you have any lab test that is abnormal or we need to change your treatment, we will call you to review the results.  Testing/Procedures: None ordered  Follow-Up: At [facility], you and your health needs are our priority.  As part of our continuing mission to provide you with exceptional heart care, we have created designated Provider Care Teams.  These Care Teams include your primary Cardiologist (physician) and Advanced Practice Providers (APPs -  Physician Assistants and Nurse Practitioners) who all work together to provide you with the care you need, when you need it.  We recommend signing up for the patient portal called "MyChart".  Sign up information is provided on this After Visit Summary.  MyChart is used to connect with patients for Virtual Visits (Telemedicine).  Patients are able to view lab/test results, encounter notes, upcoming appointments, etc.  Non-urgent messages can be sent to your provider as well.   To learn more about what you can do with MyChart, go to [email or web address].    Your next appointment:   12 month(s)  Provider:   You may see Yvonne Kendall, MD or one of the following Advanced Practice Providers on your designated Care Team:   Eula Listen, [place]

## 2023-02-02 NOTE — Telephone Encounter (Signed)
[employer] rep called to confirm provider page was received but the rest of the application was missing.   Will fax patient pages.  Will need provider pages faxed back to me or uploaded to patients media. Provider NPI and state license number were incomplete and will need to be updated on app.   Could someone get this faxed back to me at [phone]?

## 2023-02-03 NOTE — Telephone Encounter (Signed)
LMTCB. Need to know if she received her patient assistance paperwork in the mail for her eliquis

## 2023-02-07 ENCOUNTER — Encounter: Payer: Self-pay | Admitting: Internal Medicine

## 2023-02-08 NOTE — Telephone Encounter (Signed)
See phone note

## 2023-02-08 NOTE — Telephone Encounter (Signed)
LM for patient.      See message below.

## 2023-02-09 NOTE — Telephone Encounter (Signed)
Patient returned office phone call. She did receive a Patient Assistance form in August. She completed it and mail application out in August for her Eliquis.

## 2023-02-09 NOTE — Telephone Encounter (Signed)
Please see message from patient. Per patient, her portion was completed and mailed back. I faxed in the provider portion when we received and completed. Let me know if I need to do anything.

## 2023-02-14 NOTE — Telephone Encounter (Signed)
Faxed updated provider page with state license number and NPI

## 2023-02-15 NOTE — Telephone Encounter (Signed)
[employer] rec'd completed provider pages.   Faxed patient pages to company 02/15/23.

## 2023-02-22 ENCOUNTER — Ambulatory Visit (INDEPENDENT_AMBULATORY_CARE_PROVIDER_SITE_OTHER): Payer: Medicare Other | Admitting: *Deleted

## 2023-02-22 VITALS — Ht 62.0 in | Wt 163.0 lb

## 2023-02-22 DIAGNOSIS — Z0000 Encounter for general adult medical examination without abnormal findings: Secondary | ICD-10-CM | POA: Diagnosis not present

## 2023-02-22 DIAGNOSIS — Z780 Asymptomatic menopausal state: Secondary | ICD-10-CM

## 2023-02-22 NOTE — Patient Instructions (Signed)
April Solis , Thank you for taking time to come for your Medicare Wellness Visit. I appreciate your ongoing commitment to your health goals. Please review the following plan we discussed and let me know if I can assist you in the future.   Referrals/Orders/Follow-Ups/Clinician Recommendations: Bone Density ordered and consider updating your vaccines You have an order for:  []   2D Mammogram  []   3D Mammogram  [x]   Bone Density     Please call for appointment:  [facility]  [address] [place] [address] [phone]     Make sure to wear two-piece clothing.  No lotions, powders, or deodorants the day of the appointment. Make sure to bring picture ID and insurance card.  Bring list of medications you are currently taking including any supplements.   Schedule your [place] screening mammogram through MyChart!   Log into your MyChart account.  Go to 'Visit' (or 'Appointments' if on mobile App) --> Schedule an Appointment  Under 'Select a Reason for Visit' choose the Mammogram Screening option.  Complete the pre-visit questions and select the time and place that best fits your schedule.    This is a list of the screening recommended for you and due dates:  Health Maintenance  Topic Date Due   Hepatitis C Screening  Never done   Zoster (Shingles) Vaccine (1 of 2) 10/25/1974   Colon Cancer Screening  12/13/2022   Pneumonia Vaccine (1 of 1 - PCV) 03/25/2023*   Flu Shot  12/15/2024*   Eye exam for diabetics  06/22/2023   Hemoglobin A1C  07/05/2023   Yearly kidney health urinalysis for diabetes  08/31/2023   Complete foot exam   09/02/2023   Yearly kidney function blood test for diabetes  01/04/2024   Mammogram  01/04/2024   Medicare Annual Wellness Visit  02/22/2024   DEXA scan (bone density measurement)  Completed   HPV Vaccine  Aged Out   DTaP/Tdap/Td vaccine  Discontinued   COVID-19 Vaccine  Discontinued   *Topic was postponed. The date shown is not the original due date.    Advanced directives: (Copy Requested) Please bring a copy of your health care power of attorney and living will to the office to be added to your chart at your convenience.  Next Medicare Annual Wellness Visit scheduled for next year: Yes 02/28/24 @ 2:40

## 2023-02-22 NOTE — Progress Notes (Signed)
Subjective:   April Solis is a 67 y.o. female who presents for Medicare Annual (Subsequent) preventive examination.  Visit Complete: Virtual I connected with  [name] on 02/22/23 by a audio enabled telemedicine application and verified that I am speaking with the correct person using two identifiers.  Patient Location: Home  Provider Location: Office/Clinic  I discussed the limitations of evaluation and management by telemedicine. The patient expressed understanding and agreed to proceed.  Vital Signs: Because this visit was a virtual/telehealth visit, some criteria may be missing or patient reported. Any vitals not documented were not able to be obtained and vitals that have been documented are patient reported.  Cardiac Risk Factors include: advanced age (>72men, >69 women);diabetes mellitus;hypertension;dyslipidemia     Objective:    Today's Vitals   02/22/23 1416  Weight: 163 lb (73.9 kg)  Height: 5\' 2"  (1.575 m)   Body mass index is 29.81 kg/m.     02/22/2023    2:28 PM 10/08/2021    2:14 PM 12/12/2017    7:32 AM 10/16/2017    8:06 AM  Advanced Directives  Does Patient Have a Medical Advance Directive? Yes Yes Yes No  Type of Estate agent of [place];Living will Healthcare Power of [place];Living will Healthcare Power of [place];Living will   Does patient want to make changes to medical advance directive?  No - Patient declined    Copy of Healthcare Power of Attorney in Chart? No - copy requested No - copy requested Yes   Would patient like information on creating a medical advance directive?    No - Patient declined    Current Medications (verified) Outpatient Encounter Medications as of 02/22/2023  Medication Sig   amLODipine (NORVASC) 5 MG tablet Take 1 tablet (5 mg total) by mouth daily.   apixaban (ELIQUIS) 5 MG TABS tablet Take 1 tablet (5 mg total) by mouth 2 (two) times daily.   dorzolamide-timolol (COSOPT)  22.3-6.8 MG/ML ophthalmic solution Place 1 drop into both eyes 2 (two) times daily.   doxycycline (ADOXA) 50 MG tablet Take 1-2 tabs PO QD PRN flares   empagliflozin (JARDIANCE) 25 MG TABS tablet TAKE 1 TABLET BY MOUTH ONCE DAILY BEFOREBREAKFAST   finasteride (PROSCAR) 5 MG tablet TAKE 1 TABLET BY MOUTH DAILY   Fluocinolone Acetonide Scalp 0.01 % OIL Apply to aa's scalp QD PRN flares.   GE100 BLOOD GLUCOSE TEST test strip TEST TWICE DAILY   ketoconazole (NIZORAL) 2 % shampoo Shampoo into the scalp let sit several minutes then wash out. Use 2-3 days per week.   lisinopril (ZESTRIL) 10 MG tablet Take 1 tablet (10 mg total) by mouth daily.   metFORMIN (GLUCOPHAGE-XR) 500 MG 24 hr tablet Take 2 tablets (1,000 mg total) by mouth in the morning and at bedtime.   metoprolol tartrate (LOPRESSOR) 25 MG tablet Take 1 tablet (25 mg total) by mouth 2 (two) times daily.   minoxidil (LONITEN) 2.5 MG tablet TAKE 1 TABLET BY MOUTH DAILY   Multiple Vitamin (MULTIVITAMIN) tablet Take 1 tablet by mouth daily.   nystatin cream (MYCOSTATIN) Apply 1 Application topically 2 (two) times daily.   rosuvastatin (CRESTOR) 10 MG tablet TAKE ONE TABLET EVERY DAY   timolol (BETIMOL) 0.25 % ophthalmic solution Place 1-2 drops into both eyes daily.   triamcinolone cream (KENALOG) 0.1 % APPLY TO THE AFFECTED AREA DAILY AS NEEDED   vitamin E 200 UNIT capsule Take 200 Units by mouth daily.   No facility-administered encounter medications  on file as of 02/22/2023.    Allergies (verified) Influenza vaccines, Rubbing alcohol [alcohol], and Other   History: Past Medical History:  Diagnosis Date   Achilles rupture, right    Diabetes mellitus without complication (HCC)    Environmental allergies    Glaucoma    History of chicken pox    Hyperlipidemia    Hypertension    Paroxysmal A-fib (HCC)    Past Surgical History:  Procedure Laterality Date   BREAST BIOPSY Right 1999   neg   COLONOSCOPY WITH PROPOFOL N/A 12/12/2017    Procedure: COLONOSCOPY WITH PROPOFOL;  Surgeon: Scot Jun, MD;  Location: [facility] ENDOSCOPY;  Service: Endoscopy;  Laterality: N/A;   Family History  Problem Relation Age of Onset   Colon polyps Mother    Alzheimer's disease Mother    Heart attack Father 47   Hypertension Father    Diabetes Father    Hypertension Sister    Diabetes Sister        x2   COPD Sister    Diabetes Sister    Hypertension Sister    Glaucoma Sister    Arrhythmia Son        Faster heart beat than normal   Breast cancer Neg Hx    Colon cancer Neg Hx    Social History   Socioeconomic History   Marital status: Married    Spouse name: Not on file   Number of children: 3   Years of education: Not on file   Highest education level: Not on file  Occupational History    Employer: [employer]  Tobacco Use   Smoking status: Never   Smokeless tobacco: Never  Vaping Use   Vaping status: Never Used  Substance and Sexual Activity   Alcohol use: No    Alcohol/week: 0.0 standard drinks of alcohol   Drug use: No   Sexual activity: Not on file  Other Topics Concern   Not on file  Social History Narrative   Married   Social Determinants of Health   Financial Resource Strain: Low Risk  (02/22/2023)   Overall Financial Resource Strain (CARDIA)    Difficulty of Paying Living Expenses: Not hard at all  Food Insecurity: No Food Insecurity (02/22/2023)   Hunger Vital Sign    Worried About Running Out of Food in the Last Year: Never true    Ran Out of Food in the Last Year: Never true  Transportation Needs: No Transportation Needs (02/22/2023)   PRAPARE - Administrator, Civil Service (Medical): No    Lack of Transportation (Non-Medical): No  Physical Activity: Sufficiently Active (02/22/2023)   Exercise Vital Sign    Days of Exercise per Week: 5 days    Minutes of Exercise per Session: 30 min  Stress: No Stress Concern Present (02/22/2023)   [employer] of Occupational Health -  Occupational Stress Questionnaire    Feeling of Stress : Not at all  Social Connections: Socially Integrated (02/22/2023)   Social Connection and Isolation Panel [NHANES]    Frequency of Communication with Friends and Family: More than three times a week    Frequency of Social Gatherings with Friends and Family: More than three times a week    Attends Religious Services: More than 4 times per year    Active Member of [employer] or Organizations: Yes    Attends Engineer, structural: More than 4 times per year    Marital Status: Married    Tobacco Counseling Counseling  given: Not Answered   Clinical Intake:  Pre-visit preparation completed: Yes  Pain : No/denies pain     BMI - recorded: 29.81 Nutritional Status: BMI 25 -29 Overweight Nutritional Risks: None Diabetes: Yes CBG done?: Yes (FBS 163) CBG resulted in Enter/ Edit results?: No Did pt. bring in CBG monitor from home?: No  How often do you need to have someone help you when you read instructions, pamphlets, or other written materials from your doctor or pharmacy?: 1 - Never  Interpreter Needed?: No  Information entered by :: R. Manessa Buley LPN   Activities of Daily Living    02/22/2023    2:18 PM  In your present state of health, do you have any difficulty performing the following activities:  Hearing? 0  Vision? 0  Comment glasses  Difficulty concentrating or making decisions? 0  Walking or climbing stairs? 0  Dressing or bathing? 0  Doing errands, shopping? 0  Preparing Food and eating ? N  Using the Toilet? N  In the past six months, have you accidently leaked urine? N  Do you have problems with loss of bowel control? N  Managing your Medications? N  Managing your Finances? N  Housekeeping or managing your Housekeeping? N    Patient Care Team: Dale [place], MD as PCP - General (Internal Medicine) End, Cristal Deer, MD as PCP - Cardiology (Cardiology) Loree Fee, [facility] (Pharmacist)  Indicate  any recent Medical Services you may have received from other than [facility] providers in the past year (date may be approximate).     Assessment:   This is a routine wellness examination for Twilia.  Hearing/Vision screen Hearing Screening - Comments:: No issues Vision Screening - Comments:: glasses   Goals Addressed             This Visit's Progress    Patient Stated       Wants to lose some weight       Depression Screen    02/22/2023    2:22 PM 03/24/2022    1:42 PM 10/08/2021    2:06 PM 07/24/2021    2:34 PM 02/20/2021    3:41 PM 01/16/2020    8:12 AM 01/09/2019    8:43 AM  PHQ 2/9 Scores  PHQ - 2 Score 0 0 0 0 0 0 0  PHQ- 9 Score 0          Fall Risk    02/22/2023    2:19 PM 03/24/2022    1:42 PM 10/08/2021    2:55 PM 07/24/2021    2:34 PM 12/01/2020    9:03 AM  Fall Risk   Falls in the past year? 0 0 0 0 0  Number falls in past yr: 0 0 0  0  Injury with Fall? 0 0   0  Risk for fall due to : No Fall Risks No Fall Risks  No Fall Risks No Fall Risks  Follow up Falls prevention discussed;Falls evaluation completed Falls evaluation completed Falls evaluation completed Falls evaluation completed Falls evaluation completed    MEDICARE RISK AT HOME: Medicare Risk at Home Any stairs in or around the home?: No If so, are there any without handrails?: No Home free of loose throw rugs in walkways, pet beds, electrical cords, etc?: Yes Adequate lighting in your home to reduce risk of falls?: Yes Life alert?: No Use of a cane, walker or w/c?: No Grab bars in the bathroom?: No Shower chair or bench in shower?: Yes Elevated toilet seat  or a handicapped toilet?: No      Cognitive Function:        02/22/2023    2:28 PM  6CIT Screen  What Year? 0 points  What month? 0 points  What time? 0 points  Count back from 20 0 points  Months in reverse 0 points  Repeat phrase 0 points  Total Score 0 points    Immunizations Immunization History  Administered Date(s)  Administered   DTaP 04/27/2005   Zoster, Live 02/03/2013    TDAP status: Due, Education has been provided regarding the importance of this vaccine. Advised may receive this vaccine at local pharmacy or Health Dept. Aware to provide a copy of the vaccination record if obtained from local pharmacy or Health Dept. Verbalized acceptance and understanding.  Flu Vaccine status: Declined, Education has been provided regarding the importance of this vaccine but patient still declined. Advised may receive this vaccine at local pharmacy or Health Dept. Aware to provide a copy of the vaccination record if obtained from local pharmacy or Health Dept. Verbalized acceptance and understanding.  Pneumococcal vaccine status: Declined,  Education has been provided regarding the importance of this vaccine but patient still declined. Advised may receive this vaccine at local pharmacy or Health Dept. Aware to provide a copy of the vaccination record if obtained from local pharmacy or Health Dept. Verbalized acceptance and understanding.   Covid-19 vaccine status: Declined, Education has been provided regarding the importance of this vaccine but patient still declined. Advised may receive this vaccine at local pharmacy or Health Dept.or vaccine clinic. Aware to provide a copy of the vaccination record if obtained from local pharmacy or Health Dept. Verbalized acceptance and understanding.  Qualifies for Shingles Vaccine? Yes   Zostavax completed Yes   Shingrix Completed?: No.    Education has been provided regarding the importance of this vaccine. Patient has been advised to call insurance company to determine out of pocket expense if they have not yet received this vaccine. Advised may also receive vaccine at local pharmacy or Health Dept. Verbalized acceptance and understanding.  Screening Tests Health Maintenance  Topic Date Due   Hepatitis C Screening  Never done   Zoster Vaccines- Shingrix (1 of 2) 10/25/1974    Medicare Annual Wellness (AWV)  10/09/2022   Pneumonia Vaccine 25+ Years old (1 of 1 - PCV) 03/25/2023 (Originally 10/24/2020)   INFLUENZA VACCINE  12/15/2024 (Originally 11/04/2022)   OPHTHALMOLOGY EXAM  06/22/2023   HEMOGLOBIN A1C  07/05/2023   Diabetic kidney evaluation - Urine ACR  08/31/2023   FOOT EXAM  09/02/2023   Diabetic kidney evaluation - eGFR measurement  01/04/2024   MAMMOGRAM  01/04/2024   Colonoscopy  12/13/2027   DEXA SCAN  Completed   HPV VACCINES  Aged Out   DTaP/Tdap/Td  Discontinued   COVID-19 Vaccine  Discontinued    Health Maintenance  Health Maintenance Due  Topic Date Due   Hepatitis C Screening  Never done   Zoster Vaccines- Shingrix (1 of 2) 10/25/1974   Medicare Annual Wellness (AWV)  10/09/2022    Colorectal cancer screening: Type of screening: Colonoscopy. Completed 12/2017. Repeat every 5 years Has appointment scheduled 04/19/23 with GI  Mammogram status: Completed 01/2023. Repeat every year  Bone Density status: Completed 11/2011. Results reflect: Bone density results: OSTEOPENIA. Repeat every 2 years. Ordered today  Lung Cancer Screening: (Low Dose CT Chest recommended if Age 78-80 years, 20 pack-year currently smoking OR have quit w/in 15years.) does not qualify.  Additional Screening:  Hepatitis C Screening: does qualify; Completed Will have at next lab  Vision Screening: Recommended annual ophthalmology exams for early detection of glaucoma and other disorders of the eye. Is the patient up to date with their annual eye exam?  Yes  Who is the provider or what is the name of the office in which the patient attends annual eye exams? [facility] If pt is not established with a provider, would they like to be referred to a provider to establish care? No .   Dental Screening: Recommended annual dental exams for proper oral hygiene  Diabetic Foot Exam: Diabetic Foot Exam: Completed 08/2023  Community Resource Referral / Chronic Care  Management: CRR required this visit?  No   CCM required this visit?  No     Plan:     I have personally reviewed and noted the following in the patient's chart:   Medical and social history Use of alcohol, tobacco or illicit drugs  Current medications and supplements including opioid prescriptions. Patient is not currently taking opioid prescriptions. Functional ability and status Nutritional status Physical activity Advanced directives List of other physicians Hospitalizations, surgeries, and ER visits in previous 12 months Vitals Screenings to include cognitive, depression, and falls Referrals and appointments  In addition, I have reviewed and discussed with patient certain preventive protocols, quality metrics, and best practice recommendations. A written personalized care plan for preventive services as well as general preventive health recommendations were provided to patient.     Sydell Axon, LPN   [id number]   After Visit Summary: (MyChart) Due to this being a telephonic visit, the after visit summary with patients personalized plan was offered to patient via MyChart   Nurse Notes: None

## 2023-02-23 NOTE — Telephone Encounter (Signed)
Refaxed pt pages to [facility]. 2nd attempt.

## 2023-02-28 NOTE — Progress Notes (Signed)
Pharmacy Medication Assistance Program Note    02/28/2023  [name], female   DOB: 1956/03/19, 67 y.o.   MRN: [id number]     02/28/2023  Outreach Medication One  Manufacturer Medication One [employer]  [facility] Eliquis  Type of Radiographer, therapeutic Assistance  Patient Assistance Determination Approved  Approval Start Date 02/17/2023  Approval End Date 04/05/2023       Never rec'd [employer] CARES application back from patient for [place] assistance.

## 2023-03-17 ENCOUNTER — Other Ambulatory Visit: Payer: Self-pay | Admitting: Internal Medicine

## 2023-04-11 ENCOUNTER — Other Ambulatory Visit: Payer: Self-pay

## 2023-04-11 DIAGNOSIS — L739 Follicular disorder, unspecified: Secondary | ICD-10-CM

## 2023-04-11 MED ORDER — DOXYCYCLINE MONOHYDRATE 50 MG PO TABS: ORAL_TABLET | ORAL | 1 refills | Status: DC

## 2023-04-11 NOTE — Progress Notes (Signed)
Doxycycline refills.

## 2023-04-12 ENCOUNTER — Other Ambulatory Visit (INDEPENDENT_AMBULATORY_CARE_PROVIDER_SITE_OTHER): Payer: Medicare Other

## 2023-04-12 DIAGNOSIS — E785 Hyperlipidemia, unspecified: Secondary | ICD-10-CM

## 2023-04-12 DIAGNOSIS — I10 Essential (primary) hypertension: Secondary | ICD-10-CM | POA: Diagnosis not present

## 2023-04-12 DIAGNOSIS — E1169 Type 2 diabetes mellitus with other specified complication: Secondary | ICD-10-CM

## 2023-04-12 DIAGNOSIS — E1165 Type 2 diabetes mellitus with hyperglycemia: Secondary | ICD-10-CM

## 2023-04-12 LAB — HEPATIC FUNCTION PANEL
ALT: 22 U/L (ref 0–35)
AST: 21 U/L (ref 0–37)
Albumin: 4.2 g/dL (ref 3.5–5.2)
Alkaline Phosphatase: 71 U/L (ref 39–117)
Bilirubin, Direct: 0.1 mg/dL (ref 0.0–0.3)
Total Bilirubin: 0.3 mg/dL (ref 0.2–1.2)
Total Protein: 6.9 g/dL (ref 6.0–8.3)

## 2023-04-12 LAB — BASIC METABOLIC PANEL
BUN: 19 mg/dL (ref 6–23)
CO2: 25 meq/L (ref 19–32)
Calcium: 8.9 mg/dL (ref 8.4–10.5)
Chloride: 106 meq/L (ref 96–112)
Creatinine, Ser: 0.88 mg/dL (ref 0.40–1.20)
GFR: 67.98 mL/min (ref >60.00)
Glucose, Bld: 133 mg/dL — ABNORMAL HIGH (ref 70–99)
Potassium: 4.5 meq/L (ref 3.5–5.1)
Sodium: 140 meq/L (ref 135–145)

## 2023-04-12 LAB — LIPID PANEL
Cholesterol: 109 mg/dL (ref 0–200)
HDL: 37.5 mg/dL — ABNORMAL LOW (ref >39.00)
LDL Cholesterol: 32 mg/dL (ref 0–99)
NonHDL: 71.35
Total CHOL/HDL Ratio: 3
Triglycerides: 198 mg/dL — ABNORMAL HIGH (ref 0.0–149.0)
VLDL: 39.6 mg/dL (ref 0.0–40.0)

## 2023-04-12 LAB — HEMOGLOBIN A1C: Hgb A1c MFr Bld: 7.2 % — ABNORMAL HIGH (ref 4.6–6.5)

## 2023-04-14 ENCOUNTER — Ambulatory Visit (INDEPENDENT_AMBULATORY_CARE_PROVIDER_SITE_OTHER): Payer: Medicare Other | Admitting: Internal Medicine

## 2023-04-14 VITALS — BP 104/70 | HR 88 | Temp 97.8°F | Resp 16 | Ht 62.0 in | Wt 167.4 lb

## 2023-04-14 DIAGNOSIS — E1169 Type 2 diabetes mellitus with other specified complication: Secondary | ICD-10-CM | POA: Diagnosis not present

## 2023-04-14 DIAGNOSIS — Z7984 Long term (current) use of oral hypoglycemic drugs: Secondary | ICD-10-CM

## 2023-04-14 DIAGNOSIS — I152 Hypertension secondary to endocrine disorders: Secondary | ICD-10-CM | POA: Diagnosis not present

## 2023-04-14 DIAGNOSIS — E1165 Type 2 diabetes mellitus with hyperglycemia: Secondary | ICD-10-CM | POA: Diagnosis not present

## 2023-04-14 DIAGNOSIS — F439 Reaction to severe stress, unspecified: Secondary | ICD-10-CM

## 2023-04-14 DIAGNOSIS — I480 Paroxysmal atrial fibrillation: Secondary | ICD-10-CM

## 2023-04-14 DIAGNOSIS — E1159 Type 2 diabetes mellitus with other circulatory complications: Secondary | ICD-10-CM

## 2023-04-14 DIAGNOSIS — E785 Hyperlipidemia, unspecified: Secondary | ICD-10-CM

## 2023-04-14 MED ORDER — GLIPIZIDE ER 2.5 MG PO TB24: 2.5000 mg | ORAL_TABLET | Freq: Every day | ORAL | 2 refills | Status: DC

## 2023-04-14 NOTE — Progress Notes (Signed)
 Subjective:    [name], female    DOB: [birth year], 68 y.o.   MRN: [id number]  Patient here for  Chief Complaint  Patient presents with   Medical Management of Chronic Issues    HPI Here for a scheduled follow up - follow up regarding afib, hypertension, hypercholesterolemia and diabetes. Saw cardiology 02/01/23 - on metoprolol . Continue eliquis . Stable. Currently on jardiance  and metformin . Discussed labs. A1c increased to 7.2. she is on jardiance  25mg  and metformin  1000mg  bid. Unable to take GLP 1 agonist. Discussed other treatment options.  No chest pain or sob. Feels from cardiac standpoint - stable. No nausea or vomiting. No abdominal pain.  Has noticed some problems opening water bottle.  Some decreased strength/stiffness. Trigger finger - third.    Past Medical History:  Diagnosis Date   Achilles rupture, right    Diabetes mellitus without complication (HCC)    Environmental allergies    Glaucoma    History of chicken pox    Hyperlipidemia    Hypertension    Paroxysmal A-fib [facility])    Past Surgical History:  Procedure Laterality Date   BREAST BIOPSY Right 1999   neg   COLONOSCOPY WITH PROPOFOL  N/A 12/12/2017   Procedure: COLONOSCOPY WITH PROPOFOL ;  Surgeon: Viktoria Lamar DASEN, MD;  Location: [facility] ENDOSCOPY;  Service: Endoscopy;  Laterality: N/A;   Family History  Problem Relation Age of Onset   Colon polyps Mother    Alzheimer's disease Mother    Heart attack Father 9   Hypertension Father    Diabetes Father    Hypertension Sister    Diabetes Sister        x2   COPD Sister    Diabetes Sister    Hypertension Sister    Glaucoma Sister    Arrhythmia Son        Faster heart beat than normal   Breast cancer Neg Hx    Colon cancer Neg Hx    Social History   Socioeconomic History   Marital status: Married    Spouse name: Not on file   Number of children: 3   Years of education: Not on file   Highest education level: Some college, no  degree  Occupational History    Employer: [employer]  Tobacco Use   Smoking status: Never   Smokeless tobacco: Never  Vaping Use   Vaping status: Never Used  Substance and Sexual Activity   Alcohol use: No    Alcohol/week: 0.0 standard drinks of alcohol   Drug use: No   Sexual activity: Not on file  Other Topics Concern   Not on file  Social History Narrative   Married   Social Drivers of Health   Financial Resource Strain: Low Risk  (04/11/2023)   Overall Financial Resource Strain (CARDIA)    Difficulty of Paying Living Expenses: Not very hard  Food Insecurity: No Food Insecurity (04/11/2023)   Hunger Vital Sign    Worried About Running Out of Food in the Last Year: Never true    Ran Out of Food in the Last Year: Never true  Transportation Needs: No Transportation Needs (04/11/2023)   PRAPARE - Administrator, Civil Service (Medical): No    Lack of Transportation (Non-Medical): No  Physical Activity: Insufficiently Active (04/11/2023)   Exercise Vital Sign    Days of Exercise per Week: 4 days    Minutes of Exercise per Session: 30 min  Stress: No Stress Concern Present (04/11/2023)  [employer] of Occupational Health - Occupational Stress Questionnaire    Feeling of Stress : Not at all  Social Connections: Socially Integrated (04/11/2023)   Social Connection and Isolation Panel [NHANES]    Frequency of Communication with Friends and Family: More than three times a week    Frequency of Social Gatherings with Friends and Family: Twice a week    Attends Religious Services: More than 4 times per year    Active Member of [employer] or Organizations: Yes    Attends Engineer, Structural: More than 4 times per year    Marital Status: Married     Review of Systems  Constitutional:  Negative for appetite change and unexpected weight change.  HENT:  Negative for congestion and sinus pressure.   Respiratory:  Negative for cough, chest tightness and shortness of breath.    Cardiovascular:  Negative for chest pain, palpitations and leg swelling.  Gastrointestinal:  Negative for abdominal pain, diarrhea, nausea and vomiting.  Genitourinary:  Negative for difficulty urinating and dysuria.  Musculoskeletal:  Negative for joint swelling and myalgias.       Hand issues as outlined.   Skin:  Negative for color change and rash.  Neurological:  Negative for dizziness and headaches.  Psychiatric/Behavioral:  Negative for agitation and dysphoric mood.        Objective:     BP 104/70   Pulse 88   Temp 97.8 F (36.6 C)   Resp 16   Ht 5' 2 (1.575 m)   Wt 167 lb 6.4 oz (75.9 kg)   LMP 02/26/2008   SpO2 98%   BMI 30.62 kg/m  Wt Readings from Last 3 Encounters:  04/14/23 167 lb 6.4 oz (75.9 kg)  02/22/23 163 lb (73.9 kg)  02/01/23 165 lb 12.8 oz (75.2 kg)    Physical Exam Vitals reviewed.  Constitutional:      General: She is not in acute distress.    Appearance: Normal appearance.  HENT:     Head: Normocephalic and atraumatic.     Right Ear: External ear normal.     Left Ear: External ear normal.     Mouth/Throat:     Pharynx: No oropharyngeal exudate or posterior oropharyngeal erythema.  Eyes:     General: No scleral icterus.       Right eye: No discharge.        Left eye: No discharge.     Conjunctiva/sclera: Conjunctivae normal.  Neck:     Thyroid : No thyromegaly.  Cardiovascular:     Rate and Rhythm: Normal rate and regular rhythm.  Pulmonary:     Effort: No respiratory distress.     Breath sounds: Normal breath sounds. No wheezing.  Abdominal:     General: Bowel sounds are normal.     Palpations: Abdomen is soft.     Tenderness: There is no abdominal tenderness.  Musculoskeletal:        General: No swelling or tenderness.     Cervical back: Neck supple. No tenderness.     Comments: Grip strength - normal.   Lymphadenopathy:     Cervical: No cervical adenopathy.  Skin:    Findings: No erythema or rash.  Neurological:     Mental  Status: She is alert.  Psychiatric:        Mood and Affect: Mood normal.        Behavior: Behavior normal.      Outpatient Encounter Medications as of 04/14/2023  Medication Sig  glipiZIDE  (GLUCOTROL  XL) 2.5 MG 24 hr tablet Take 1 tablet (2.5 mg total) by mouth daily with breakfast.   amLODipine  (NORVASC ) 5 MG tablet Take 1 tablet (5 mg total) by mouth daily.   dorzolamide-timolol (COSOPT) 22.3-6.8 MG/ML ophthalmic solution Place 1 drop into both eyes 2 (two) times daily.   doxycycline  (ADOXA) 50 MG tablet Take 1-2 tabs PO QD PRN flares   ELIQUIS  5 MG TABS tablet TAKE ONE TABLET BY MOUTH TWICE DAILY   empagliflozin  (JARDIANCE ) 25 MG TABS tablet TAKE 1 TABLET BY MOUTH ONCE DAILY BEFOREBREAKFAST   finasteride  (PROSCAR ) 5 MG tablet TAKE 1 TABLET BY MOUTH DAILY   Fluocinolone  Acetonide Scalp 0.01 % OIL Apply to aa's scalp QD PRN flares.   GE100 BLOOD GLUCOSE TEST test strip TEST TWICE DAILY   ketoconazole  (NIZORAL ) 2 % shampoo Shampoo into the scalp let sit several minutes then wash out. Use 2-3 days per week.   lisinopril  (ZESTRIL ) 10 MG tablet Take 1 tablet (10 mg total) by mouth daily.   metFORMIN  (GLUCOPHAGE -XR) 500 MG 24 hr tablet Take 2 tablets (1,000 mg total) by mouth in the morning and at bedtime.   metoprolol  tartrate (LOPRESSOR ) 25 MG tablet Take 1 tablet (25 mg total) by mouth 2 (two) times daily.   minoxidil  (LONITEN ) 2.5 MG tablet TAKE 1 TABLET BY MOUTH DAILY   Multiple Vitamin (MULTIVITAMIN) tablet Take 1 tablet by mouth daily.   nystatin  cream (MYCOSTATIN ) Apply 1 Application topically 2 (two) times daily.   rosuvastatin  (CRESTOR ) 10 MG tablet TAKE ONE TABLET EVERY DAY   timolol (BETIMOL) 0.25 % ophthalmic solution Place 1-2 drops into both eyes daily.   triamcinolone  cream (KENALOG ) 0.1 % APPLY TO THE AFFECTED AREA DAILY AS NEEDED   vitamin E 200 UNIT capsule Take 200 Units by mouth daily.   No facility-administered encounter medications on file as of 04/14/2023.     Lab  Results  Component Value Date   WBC 7.0 01/04/2023   HGB 14.6 01/04/2023   HCT 44.1 01/04/2023   PLT 220.0 01/04/2023   GLUCOSE 133 (H) 04/12/2023   CHOL 109 04/12/2023   TRIG 198.0 (H) 04/12/2023   HDL 37.50 (L) 04/12/2023   LDLDIRECT 63.0 04/28/2022   LDLCALC 32 04/12/2023   ALT 22 04/12/2023   AST 21 04/12/2023   NA 140 04/12/2023   K 4.5 04/12/2023   CL 106 04/12/2023   CREATININE 0.88 04/12/2023   BUN 19 04/12/2023   CO2 25 04/12/2023   TSH 0.96 01/04/2023   HGBA1C 7.2 (H) 04/12/2023   MICROALBUR <0.7 08/31/2022    MM 3D DIAGNOSTIC MAMMOGRAM UNILATERAL LEFT BREAST Result Date: 01/18/2023 CLINICAL DATA:  Left breast asymmetries seen on most recent screening mammography. EXAM: DIGITAL DIAGNOSTIC UNILATERAL LEFT MAMMOGRAM WITH TOMOSYNTHESIS AND CAD; ULTRASOUND LEFT BREAST LIMITED TECHNIQUE: Left digital diagnostic mammography and breast tomosynthesis was performed. The images were evaluated with computer-aided detection. ; Targeted ultrasound examination of the left breast was performed. COMPARISON:  Previous exam(s). ACR Breast Density Category c: The breasts are heterogeneously dense, which may obscure small masses. FINDINGS: Additional mammographic views of the left breast demonstrate no definite suspicious masses or areas of architectural distortion. The breast parenchyma resumes appearance stable from patient's prior mammograms. The patient admits to 10 pound weight loss, which may account for the overall mild increased density of her breast. Left breast ultrasound is performed by the sonographer and the radiologist, and demonstrates no suspicious masses or shadowing lesions. IMPRESSION: No mammographic or sonographic evidence  of left breast malignancy. RECOMMENDATION: Screening mammogram in one year.(Code:SM-B-01Y) I have discussed the findings and recommendations with the patient. If applicable, a reminder letter will be sent to the patient regarding the next appointment. BI-RADS  CATEGORY  2: Benign. Electronically Signed   By: Dobrinka  Dimitrova M.D.   On: 01/18/2023 14:48   US  LIMITED ULTRASOUND INCLUDING AXILLA LEFT BREAST  Result Date: 01/18/2023 CLINICAL DATA:  Left breast asymmetries seen on most recent screening mammography. EXAM: DIGITAL DIAGNOSTIC UNILATERAL LEFT MAMMOGRAM WITH TOMOSYNTHESIS AND CAD; ULTRASOUND LEFT BREAST LIMITED TECHNIQUE: Left digital diagnostic mammography and breast tomosynthesis was performed. The images were evaluated with computer-aided detection. ; Targeted ultrasound examination of the left breast was performed. COMPARISON:  Previous exam(s). ACR Breast Density Category c: The breasts are heterogeneously dense, which may obscure small masses. FINDINGS: Additional mammographic views of the left breast demonstrate no definite suspicious masses or areas of architectural distortion. The breast parenchyma resumes appearance stable from patient's prior mammograms. The patient admits to 10 pound weight loss, which may account for the overall mild increased density of her breast. Left breast ultrasound is performed by the sonographer and the radiologist, and demonstrates no suspicious masses or shadowing lesions. IMPRESSION: No mammographic or sonographic evidence of left breast malignancy. RECOMMENDATION: Screening mammogram in one year.(Code:SM-B-01Y) I have discussed the findings and recommendations with the patient. If applicable, a reminder letter will be sent to the patient regarding the next appointment. BI-RADS CATEGORY  2: Benign. Electronically Signed   By: Dobrinka  Dimitrova M.D.   On: 01/18/2023 14:48       Assessment & Plan:  Type 2 diabetes mellitus with hyperglycemia, without long-term current use of insulin  (HCC) Assessment & Plan: Low carb diet and exercise. Recent a1c 7.2.  On jardiance  and metformin . Have discussed diet and exercise.  Saw pharmacy.  Recommended starting ozempic.  Mother with thyroid  cancer - deceased.  Have been  unable to determine the type of thyroid  cancer.  Hold on starting GLP-1 agonist.  Sugars as outlined. Discussed treatment options.  Will start glipizide  XL 2.5mg  q day. Continue diet and exercise. Follow met b and A1c.   Orders: -     Hemoglobin A1c; Future  Hyperlipidemia associated with type 2 diabetes mellitus (HCC) Assessment & Plan: On crestor .  Low cholesterol diet and exercise. Follow lipid panel and liver function tests.   Lab Results  Component Value Date   CHOL 109 04/12/2023   HDL 37.50 (L) 04/12/2023   LDLCALC 32 04/12/2023   LDLDIRECT 63.0 04/28/2022   TRIG 198.0 (H) 04/12/2023   CHOLHDL 3 04/12/2023    Orders: -     Lipid panel; Future -     Hepatic function panel; Future -     Basic metabolic panel; Future  Stress Assessment & Plan: Overall appears to be handling stress relatively well.  Follow.    PAF (paroxysmal atrial fibrillation) (HCC) Assessment & Plan: On eliquis .  Continue metoprolol .  No chest pain or sob reported.  Rate controlled. Feels better.  Follow.    Hypertension associated with type 2 diabetes mellitus (HCC) Assessment & Plan: Blood pressure as outlined.  Currently on lopressor , amlodipine  and lisinopril .  Follow pressures.  Follow metabolic panel.    Other orders -     glipiZIDE  ER; Take 1 tablet (2.5 mg total) by mouth daily with breakfast.  Dispense: 30 tablet; Refill: 2     Allena Hamilton, MD

## 2023-04-17 ENCOUNTER — Encounter: Payer: Self-pay | Admitting: Internal Medicine

## 2023-04-17 NOTE — Assessment & Plan Note (Signed)
 On crestor .  Low cholesterol diet and exercise. Follow lipid panel and liver function tests.   Lab Results  Component Value Date   CHOL 109 04/12/2023   HDL 37.50 (L) 04/12/2023   LDLCALC 32 04/12/2023   LDLDIRECT 63.0 04/28/2022   TRIG 198.0 (H) 04/12/2023   CHOLHDL 3 04/12/2023

## 2023-04-17 NOTE — Assessment & Plan Note (Signed)
Overall appears to be handling stress relatively well.  Follow.  

## 2023-04-17 NOTE — Assessment & Plan Note (Signed)
 On eliquis.  Continue metoprolol.  No chest pain or sob reported.  Rate controlled. Feels better.  Follow.

## 2023-04-17 NOTE — Assessment & Plan Note (Signed)
Blood pressure as outlined.  Currently on lopressor, amlodipine and lisinopril.  Follow pressures.  Follow metabolic panel.  

## 2023-04-17 NOTE — Assessment & Plan Note (Signed)
 Low carb diet and exercise. Recent a1c 7.2.  On jardiance  and metformin . Have discussed diet and exercise.  Saw pharmacy.  Recommended starting ozempic.  Mother with thyroid  cancer - deceased.  Have been unable to determine the type of thyroid  cancer.  Hold on starting GLP-1 agonist.  Sugars as outlined. Discussed treatment options.  Will start glipizide  XL 2.5mg  q day. Continue diet and exercise. Follow met b and A1c.

## 2023-04-19 ENCOUNTER — Telehealth: Payer: Self-pay | Admitting: Physician Assistant

## 2023-04-19 DIAGNOSIS — Z01818 Encounter for other preprocedural examination: Secondary | ICD-10-CM | POA: Diagnosis not present

## 2023-04-19 DIAGNOSIS — Z83719 Family history of colon polyps, unspecified: Secondary | ICD-10-CM | POA: Diagnosis not present

## 2023-04-19 DIAGNOSIS — Z7901 Long term (current) use of anticoagulants: Secondary | ICD-10-CM | POA: Diagnosis not present

## 2023-04-19 NOTE — Telephone Encounter (Signed)
   Pre-operative Risk Assessment    Patient Name: April Solis  DOB: [birth year] MRN: [id number]   Date of last office visit: 02/01/23 Date of next office visit: n/a   Request for Surgical Clearance    Procedure:  Colonoscopy  Date of Surgery:  Clearance 06/24/23                                Surgeon:  Dr. Onita Socks Group or Practice Name:  [facility] Phone number:  [phone] Fax number:  [phone]   Type of Clearance Requested:  Eliquis     Type of Anesthesia:  General    Additional requests/questions:    Signed, Arsenio Celine GAILS   04/19/2023, 4:33 PM

## 2023-04-20 NOTE — Telephone Encounter (Signed)
   Patient Name: April Solis  DOB: 08/09/1955 MRN: [id number]  Primary Cardiologist: Sammy Crisp, MD  Clinical pharmacists have reviewed the patient's past medical history, labs, and current medications as part of preoperative protocol coverage. The following recommendations have been made:  Patient with diagnosis of PAF on Eliquis  for anticoagulation.     Procedure: Colonoscopy  Date of procedure: 06/24/2023     CHA2DS2-VASc Score = 4   This indicates a 4.8% annual risk of stroke. The patient's score is based upon: CHF History: 0 HTN History: 1 Diabetes History: 1 Stroke History: 0 Vascular Disease History: 0 Age Score: 1 Gender Score: 1       CrCl 74 mL/min Platelet count 220 01/2023       Per office protocol, patient can hold Eliquis  for 2 days prior to procedure.      I will route this recommendation to the requesting party via Epic fax function and remove from pre-op pool.  Please call with questions.  Friddie Jetty, NP 04/20/2023, 4:30 PM

## 2023-04-20 NOTE — Telephone Encounter (Signed)
 Pharmacy please advise on holding Eliquis  prior to colonoscopy scheduled for 06/24/2023. Thank you.

## 2023-04-20 NOTE — Telephone Encounter (Signed)
 Patient with diagnosis of PAF on Eliquis  for anticoagulation.    Procedure: Colonoscopy  Date of procedure: 06/24/2023   CHA2DS2-VASc Score = 4   This indicates a 4.8% annual risk of stroke. The patient's score is based upon: CHF History: 0 HTN History: 1 Diabetes History: 1 Stroke History: 0 Vascular Disease History: 0 Age Score: 1 Gender Score: 1     CrCl 74 mL/min Platelet count 220 01/2023    Per office protocol, patient can hold Eliquis  for 2 days prior to procedure.     **This guidance is not considered finalized until pre-operative APP has relayed final recommendations.**

## 2023-05-18 ENCOUNTER — Other Ambulatory Visit: Payer: Self-pay | Admitting: Internal Medicine

## 2023-05-18 ENCOUNTER — Other Ambulatory Visit: Payer: Self-pay | Admitting: Dermatology

## 2023-05-18 DIAGNOSIS — L649 Androgenic alopecia, unspecified: Secondary | ICD-10-CM

## 2023-06-13 ENCOUNTER — Other Ambulatory Visit: Payer: Self-pay | Admitting: Internal Medicine

## 2023-06-16 ENCOUNTER — Encounter: Payer: Self-pay | Admitting: Gastroenterology

## 2023-06-16 ENCOUNTER — Other Ambulatory Visit: Payer: Self-pay | Admitting: Dermatology

## 2023-06-16 DIAGNOSIS — L649 Androgenic alopecia, unspecified: Secondary | ICD-10-CM

## 2023-06-17 ENCOUNTER — Other Ambulatory Visit: Payer: Self-pay | Admitting: Internal Medicine

## 2023-06-24 ENCOUNTER — Encounter: Payer: Self-pay | Admitting: Gastroenterology

## 2023-06-24 ENCOUNTER — Ambulatory Visit
Admission: RE | Admit: 2023-06-24 | Discharge: 2023-06-24 | Disposition: A | Discharge status: Home / Self Care | Payer: Medicare Other | Attending: Gastroenterology | Admitting: Gastroenterology

## 2023-06-24 ENCOUNTER — Ambulatory Visit: Admitting: Anesthesiology

## 2023-06-24 ENCOUNTER — Encounter
Admission: RE | Disposition: A | Discharge status: Home / Self Care | Payer: Self-pay | Source: Home / Self Care | Attending: Gastroenterology

## 2023-06-24 DIAGNOSIS — E119 Type 2 diabetes mellitus without complications: Secondary | ICD-10-CM | POA: Diagnosis not present

## 2023-06-24 DIAGNOSIS — K5730 Diverticulosis of large intestine without perforation or abscess without bleeding: Secondary | ICD-10-CM | POA: Insufficient documentation

## 2023-06-24 DIAGNOSIS — K649 Unspecified hemorrhoids: Secondary | ICD-10-CM | POA: Diagnosis not present

## 2023-06-24 DIAGNOSIS — Z7901 Long term (current) use of anticoagulants: Secondary | ICD-10-CM | POA: Diagnosis not present

## 2023-06-24 DIAGNOSIS — I10 Essential (primary) hypertension: Secondary | ICD-10-CM | POA: Insufficient documentation

## 2023-06-24 DIAGNOSIS — Z1211 Encounter for screening for malignant neoplasm of colon: Secondary | ICD-10-CM | POA: Diagnosis not present

## 2023-06-24 DIAGNOSIS — I480 Paroxysmal atrial fibrillation: Secondary | ICD-10-CM | POA: Diagnosis not present

## 2023-06-24 DIAGNOSIS — Z09 Encounter for follow-up examination after completed treatment for conditions other than malignant neoplasm: Secondary | ICD-10-CM | POA: Diagnosis not present

## 2023-06-24 DIAGNOSIS — Z83719 Family history of colon polyps, unspecified: Secondary | ICD-10-CM | POA: Diagnosis not present

## 2023-06-24 HISTORY — DX: Irritant contact dermatitis due to plants, except food: L24.7

## 2023-06-24 HISTORY — PX: COLONOSCOPY WITH PROPOFOL: SHX5780

## 2023-06-24 HISTORY — DX: Other specified disorders of bone density and structure, unspecified site: M85.80

## 2023-06-24 LAB — GLUCOSE, CAPILLARY: Glucose-Capillary: 139 mg/dL — ABNORMAL HIGH (ref 70–99)

## 2023-06-24 SURGERY — COLONOSCOPY WITH PROPOFOL
Anesthesia: General

## 2023-06-24 MED ORDER — SODIUM CHLORIDE 0.9 % IV SOLN
INTRAVENOUS | Status: DC
  Administered 2023-06-24: 20 mL/h via INTRAVENOUS

## 2023-06-24 MED ORDER — PROPOFOL 10 MG/ML IV BOLUS
INTRAVENOUS | Status: DC | PRN
  Administered 2023-06-24: 40 mg via INTRAVENOUS

## 2023-06-24 MED ORDER — PROPOFOL 500 MG/50ML IV EMUL
INTRAVENOUS | Status: DC | PRN
  Administered 2023-06-24: 145 ug/kg/min via INTRAVENOUS

## 2023-06-24 MED ORDER — PHENYLEPHRINE 80 MCG/ML (10ML) SYRINGE FOR IV PUSH (FOR BLOOD PRESSURE SUPPORT)
PREFILLED_SYRINGE | INTRAVENOUS | Status: DC | PRN
  Administered 2023-06-24 (×2): 80 ug via INTRAVENOUS

## 2023-06-24 MED ORDER — PROPOFOL 1000 MG/100ML IV EMUL
INTRAVENOUS | Status: AC
  Filled 2023-06-24: qty 200

## 2023-06-24 NOTE — Op Note (Signed)
 [facility] Gastroenterology Patient Name: April Solis Procedure Date: 06/24/2023 7:19 AM MRN: [id number] Account #: [id number] Date of Birth: January 06, 1956 Admit Type: Outpatient Age: 68 Room: [facility] ENDO ROOM 1 Gender: Female Note Status: Finalized Instrument Name: Prentice Docker [id number] Procedure:             Colonoscopy Indications:           Colon cancer screening in patient at increased risk:                         Family history of colon polyps in multiple 1st-degree                         relatives Providers:             [name] DO, DO Referring MD:          Dale Starr School, MD (Referring MD) Medicines:             Monitored Anesthesia Care Complications:         No immediate complications. Estimated blood loss: None. Procedure:             Pre-Anesthesia Assessment:                        - Prior to the procedure, a History and Physical was                         performed, and patient medications and allergies were                         reviewed. The patient is competent. The risks and                         benefits of the procedure and the sedation options and                         risks were discussed with the patient. All questions                         were answered and informed consent was obtained.                         Patient identification and proposed procedure were                         verified by the physician, the nurse, the anesthetist                         and the technician in the endoscopy suite. Mental                         Status Examination: alert and oriented. Airway                         Examination: normal oropharyngeal airway and neck                         mobility. Respiratory Examination: clear to  auscultation. CV Examination: RRR, no murmurs, no S3                         or S4. Prophylactic Antibiotics: The patient does not                         require prophylactic  antibiotics. Prior                         Anticoagulants: The patient has taken Eliquis                         (apixaban), last dose was 2 days prior to procedure.                         ASA Grade Assessment: II - A patient with mild                         systemic disease. After reviewing the risks and                         benefits, the patient was deemed in satisfactory                         condition to undergo the procedure. The anesthesia                         plan was to use monitored anesthesia care (MAC).                         Immediately prior to administration of medications,                         the patient was re-assessed for adequacy to receive                         sedatives. The heart rate, respiratory rate, oxygen                         saturations, blood pressure, adequacy of pulmonary                         ventilation, and response to care were monitored                         throughout the procedure. The physical status of the                         patient was re-assessed after the procedure.                        After obtaining informed consent, the colonoscope was                         passed under direct vision. Throughout the procedure,                         the patient's blood pressure, pulse, and oxygen  saturations were monitored continuously. The                         Colonoscope was introduced through the anus and                         advanced to the the terminal ileum, with                         identification of the appendiceal orifice and IC                         valve. The colonoscopy was performed without                         difficulty. The patient tolerated the procedure well.                         The quality of the bowel preparation was evaluated                         using the BBPS [facility] Bowel Preparation Scale) with                         scores of: Right Colon = 1 (portion of  mucosa seen,                         but other areas not well seen due to staining,                         residual stool and/or opaque liquid), Transverse Colon                         = 1 (portion of mucosa seen, but other areas not well                         seen due to staining, residual stool and/or opaque                         liquid) and Left Colon = 2 (minor amount of residual                         staining, small fragments of stool and/or opaque                         liquid, but mucosa seen well). The total BBPS score                         equals 4. The quality of the bowel preparation was                         inadequate. The terminal ileum, ileocecal valve,                         appendiceal orifice, and rectum were photographed. Findings:      The perianal and digital rectal examinations were normal. Pertinent  negatives include normal sphincter tone.      The terminal ileum appeared normal. Estimated blood loss: none.      Retroflexion in the right colon was performed.      Multiple small-mouthed diverticula were found in the sigmoid colon.       Estimated blood loss: none.      A large amount of adherent stool was found in the entire colon,       interfering with visualization. Lavage of the area was performed using a       large amount, resulting in incomplete clearance with fair visualization.       Estimated blood loss: none.      No large mass or lesion. Due to inadequate prep, cannot rule out small       to medium sized lesions. Estimated blood loss: none.      The exam was otherwise without abnormality on direct and retroflexion       views. Impression:            - Preparation of the colon was inadequate.                        - The examined portion of the ileum was normal.                        - Diverticulosis in the sigmoid colon.                        - Stool in the entire examined colon.                        - The examination was  otherwise normal on direct and                         retroflexion views.                        - No specimens collected. Recommendation:        - Patient has a contact number available for                         emergencies. The signs and symptoms of potential                         delayed complications were discussed with the patient.                         Return to normal activities tomorrow. Written                         discharge instructions were provided to the patient.                        - Discharge patient to home.                        - Resume previous diet.                        - Continue present medications.                        -  Resume Eliquis (apixaban) at prior dose today. Refer                         to managing physician for further adjustment of                         therapy.                        - Repeat colonoscopy in 6-12 months because the bowel                         preparation was poor.                        - Return to referring physician as previously                         scheduled.                        - The findings and recommendations were discussed with                         the patient. Procedure Code(s):     --- Professional ---                        [phone], Colonoscopy, flexible; diagnostic, including                         collection of specimen(s) by brushing or washing, when                         performed (separate procedure) Diagnosis Code(s):     --- Professional ---                        Z83.71, Family history of colonic polyps                        K57.30, Diverticulosis of large intestine without                         perforation or abscess without bleeding CPT copyright 2022 American Medical Association. All rights reserved. The codes documented in this report are preliminary and upon coder review may  be revised to meet current compliance requirements. Attending Participation:      I personally  performed the entire procedure. Elfredia Nevins, DO [name] DO, DO 06/24/2023 8:05:12 AM This report has been signed electronically. Number of Addenda: 0 Note Initiated On: 06/24/2023 7:19 AM Scope Withdrawal Time: 0 hours 18 minutes 7 seconds  Total Procedure Duration: 0 hours 22 minutes 55 seconds  Estimated Blood Loss:  Estimated blood loss: none.      [facility]

## 2023-06-24 NOTE — H&P (Signed)
 Pre-Procedure H&P   [name] is a 68 y.o. female.  Gastroenterology Provider: [name], DO  Referring Provider: Fransico Setters, NP PCP: Dale Los Ybanez, MD  Date: 06/24/2023  HPI April Solis is a 68 y.o. female who presents today for Colonoscopy for fhx colon polyps .  Mother and sister with history of colon polyps.  The patient is no GI complaints  Last dose of Eliquis Wednesday morning  Last colonoscopy September 2019 with hyperplastic polyp sigmoid diverticulosis and internal hemorrhoids.  Similar findings in 2009  Hemoglobin 14.6 MCV 92 platelets 220,000 creatinine 0.88   Past Medical History:  Diagnosis Date   Achilles rupture, right    Contact dermatitis and eczema due to plant    Diabetes mellitus without complication (HCC)    Environmental allergies    Glaucoma    History of chicken pox    Hyperlipidemia    Hypertension    Osteopenia    Paroxysmal A-fib (HCC)     Past Surgical History:  Procedure Laterality Date   ACHILLES TENDON REPAIR Right    BREAST BIOPSY Right 1999   neg   COLONOSCOPY WITH PROPOFOL N/A 12/12/2017   Procedure: COLONOSCOPY WITH PROPOFOL;  Surgeon: Scot Jun, MD;  Location: [facility] ENDOSCOPY;  Service: Endoscopy;  Laterality: N/A;   TUBAL LIGATION      Family History Mother and sister with colon polyps No other h/o GI disease or malignancy  Review of Systems  Constitutional:  Negative for activity change, appetite change, chills, diaphoresis, fatigue, fever and unexpected weight change.  HENT:  Negative for trouble swallowing and voice change.   Respiratory:  Negative for shortness of breath and wheezing.   Cardiovascular:  Negative for chest pain, palpitations and leg swelling.  Gastrointestinal:  Negative for abdominal distention, abdominal pain, anal bleeding, blood in stool, constipation, diarrhea, nausea, rectal pain and vomiting.  Musculoskeletal:  Negative for  arthralgias and myalgias.  Skin:  Negative for color change and pallor.  Neurological:  Negative for dizziness, syncope and weakness.  Psychiatric/Behavioral:  Negative for confusion.   All other systems reviewed and are negative.    Medications No current facility-administered medications on file prior to encounter.   Current Outpatient Medications on File Prior to Encounter  Medication Sig Dispense Refill   amLODipine (NORVASC) 5 MG tablet Take 1 tablet (5 mg total) by mouth daily. 90 tablet 3   dorzolamide-timolol (COSOPT) 22.3-6.8 MG/ML ophthalmic solution Place 1 drop into both eyes 2 (two) times daily.     doxycycline (ADOXA) 50 MG tablet Take 1-2 tabs PO QD PRN flares 60 tablet 1   ELIQUIS 5 MG TABS tablet TAKE ONE TABLET BY MOUTH TWICE DAILY 180 tablet 1   Fluocinolone Acetonide Scalp 0.01 % OIL Apply to aa's scalp QD PRN flares. 118.28 mL 6   GE100 BLOOD GLUCOSE TEST test strip TEST TWICE DAILY 100 each 2   JARDIANCE 25 MG TABS tablet TAKE ONE TABLET BY MOUTH ONCE DAILY BEFORE BREAKFAST 90 tablet 1   ketoconazole (NIZORAL) 2 % shampoo Shampoo into the scalp let sit several minutes then wash out. Use 2-3 days per week. 120 mL 6   lisinopril (ZESTRIL) 10 MG tablet Take 1 tablet (10 mg total) by mouth daily. 90 tablet 3   metoprolol tartrate (LOPRESSOR) 25 MG tablet Take 1 tablet (25 mg total) by mouth 2 (two) times daily. 180 tablet 3   minoxidil (LONITEN) 2.5 MG tablet TAKE 1 TABLET BY MOUTH  DAILY 30 tablet 3   Multiple Vitamin (MULTIVITAMIN) tablet Take 1 tablet by mouth daily.     nystatin cream (MYCOSTATIN) Apply 1 Application topically 2 (two) times daily. 30 g 0   rosuvastatin (CRESTOR) 10 MG tablet TAKE ONE TABLET EVERY DAY 90 tablet 3   timolol (BETIMOL) 0.25 % ophthalmic solution Place 1-2 drops into both eyes daily.     triamcinolone cream (KENALOG) 0.1 % APPLY TO THE AFFECTED AREA DAILY AS NEEDED 45 g 1   vitamin E 200 UNIT capsule Take 200 Units by mouth daily.       Pertinent medications related to GI and procedure were reviewed by me with the patient prior to the procedure   Current Facility-Administered Medications:    0.9 %  sodium chloride infusion, , Intravenous, Continuous, [name], DO, Last Rate: 20 mL/hr at 06/24/23 0653, 20 mL/hr at 06/24/23 1610  sodium chloride 20 mL/hr (06/24/23 0653)       Allergies  Allergen Reactions   Influenza Vaccines Other (See Comments)    Flu symptoms two hours after injections   Rubbing Alcohol [Alcohol]    Other Rash    Poison ivy   Allergies were reviewed by me prior to the procedure  Objective   Body mass index is 30.14 kg/m. Vitals:   06/24/23 0639  BP: 119/77  Pulse: 65  Resp: 20  Temp: (!) 96.9 F (36.1 C)  TempSrc: Temporal  SpO2: 100%  Weight: 74.8 kg  Height: 5\' 2"  (1.575 m)     Physical Exam Vitals and nursing note reviewed.  Constitutional:      General: She is not in acute distress.    Appearance: Normal appearance. She is not ill-appearing, toxic-appearing or diaphoretic.  HENT:     Head: Normocephalic and atraumatic.     Nose: Nose normal.     Mouth/Throat:     Mouth: Mucous membranes are moist.     Pharynx: Oropharynx is clear.  Eyes:     General: No scleral icterus.    Extraocular Movements: Extraocular movements intact.  Cardiovascular:     Rate and Rhythm: Normal rate and regular rhythm.     Heart sounds: Normal heart sounds. No murmur heard.    No friction rub. No gallop.  Pulmonary:     Effort: Pulmonary effort is normal. No respiratory distress.     Breath sounds: Normal breath sounds. No wheezing, rhonchi or rales.  Abdominal:     General: Bowel sounds are normal. There is no distension.     Palpations: Abdomen is soft.     Tenderness: There is no abdominal tenderness. There is no guarding or rebound.  Musculoskeletal:     Cervical back: Neck supple.     Right lower leg: No edema.     Left lower leg: No edema.  Skin:    General:  Skin is warm and dry.     Coloration: Skin is not jaundiced or pale.  Neurological:     General: No focal deficit present.     Mental Status: She is alert and oriented to person, place, and time. Mental status is at baseline.  Psychiatric:        Mood and Affect: Mood normal.        Behavior: Behavior normal.        Thought Content: Thought content normal.        Judgment: Judgment normal.      Assessment:  April Solis is a 68 y.o.  female  who presents today for Colonoscopy for fhx colon polyps .  Plan:  Colonoscopy with possible intervention today  Colonoscopy with possible biopsy, control of bleeding, polypectomy, and interventions as necessary has been discussed with the patient/patient representative. Informed consent was obtained from the patient/patient representative after explaining the indication, nature, and risks of the procedure including but not limited to death, bleeding, perforation, missed neoplasm/lesions, cardiorespiratory compromise, and reaction to medications. Opportunity for questions was given and appropriate answers were provided. Patient/patient representative has verbalized understanding is amenable to undergoing the procedure.   [name], DO  [facility] Gastroenterology  Portions of the record may have been created with voice recognition software. Occasional wrong-word or 'sound-a-like' substitutions may have occurred due to the inherent limitations of voice recognition software.  Read the chart carefully and recognize, using context, where substitutions may have occurred.

## 2023-06-24 NOTE — Interval H&P Note (Signed)
 History and Physical Interval Note: Preprocedure H&P from 06/24/23  was reviewed and there was no interval change after seeing and examining the patient.  Written consent was obtained from the patient after discussion of risks, benefits, and alternatives. Patient has consented to proceed with Colonoscopy with possible intervention   06/24/2023 7:30 AM  April Solis  has presented today for surgery, with the diagnosis of Z83.719 (ICD-10-CM) - FH: colon polyps.  The various methods of treatment have been discussed with the patient and family. After consideration of risks, benefits and other options for treatment, the patient has consented to  Procedure(s) with comments: COLONOSCOPY WITH PROPOFOL (N/A) - DM, 1ST CASE, PLEASE as a surgical intervention.  The patient's history has been reviewed, patient examined, no change in status, stable for surgery.  I have reviewed the patient's chart and labs.  Questions were answered to the patient's satisfaction.     [name]

## 2023-06-24 NOTE — Anesthesia Preprocedure Evaluation (Signed)
 Anesthesia Evaluation  Patient identified by MRN, date of birth, ID band Patient awake    Reviewed: Allergy & Precautions, NPO status , Patient's Chart, lab work & pertinent test results  Airway Mallampati: II  TM Distance: >3 FB Neck ROM: full    Dental  (+) Teeth Intact   Pulmonary neg pulmonary ROS   Pulmonary exam normal breath sounds clear to auscultation       Cardiovascular Exercise Tolerance: Good hypertension, Pt. on medications negative cardio ROS Normal cardiovascular exam Rhythm:Regular Rate:Normal     Neuro/Psych negative neurological ROS  negative psych ROS   GI/Hepatic negative GI ROS, Neg liver ROS,,,  Endo/Other  negative endocrine ROSdiabetes, Well Controlled, Type 2, Oral Hypoglycemic Agents    Renal/GU negative Renal ROS  negative genitourinary   Musculoskeletal   Abdominal Normal abdominal exam  (+)   Peds negative pediatric ROS (+)  Hematology negative hematology ROS (+)   Anesthesia Other Findings Past Medical History: No date: Achilles rupture, right No date: Contact dermatitis and eczema due to plant No date: Diabetes mellitus without complication (HCC) No date: Environmental allergies No date: Glaucoma No date: History of chicken pox No date: Hyperlipidemia No date: Hypertension No date: Osteopenia No date: Paroxysmal A-fib [facility])  Past Surgical History: No date: ACHILLES TENDON REPAIR; Right 1999: BREAST BIOPSY; Right     Comment:  neg 12/12/2017: COLONOSCOPY WITH PROPOFOL; N/A     Comment:  Procedure: COLONOSCOPY WITH PROPOFOL;  Surgeon: Scot Jun, MD;  Location: [facility] ENDOSCOPY;  Service:               Endoscopy;  Laterality: N/A; No date: TUBAL LIGATION  BMI    Body Mass Index: 30.14 kg/m      Reproductive/Obstetrics negative OB ROS                             Anesthesia Physical Anesthesia Plan  ASA: 2  Anesthesia  Plan: General   Post-op Pain Management:    Induction: Intravenous  PONV Risk Score and Plan: Propofol infusion and TIVA  Airway Management Planned: Natural Airway and Nasal Cannula  Additional Equipment:   Intra-op Plan:   Post-operative Plan:   Informed Consent: I have reviewed the patients History and Physical, chart, labs and discussed the procedure including the risks, benefits and alternatives for the proposed anesthesia with the patient or authorized representative who has indicated his/her understanding and acceptance.     Dental Advisory Given  Plan Discussed with: CRNA  Anesthesia Plan Comments:        Anesthesia Quick Evaluation

## 2023-06-24 NOTE — Transfer of Care (Signed)
 Immediate Anesthesia Transfer of Care Note  Patient: Xayla Puzio  Procedure(s) Performed: COLONOSCOPY WITH PROPOFOL  Patient Location: PACU  Anesthesia Type:General  Level of Consciousness: drowsy  Airway & Oxygen Therapy: Patient Spontanous Breathing and Patient connected to face mask oxygen  Post-op Assessment: Report given to RN, Post -op Vital signs reviewed and stable, and Patient moving all extremities X 4  Post vital signs: Reviewed and stable  Last Vitals:  Vitals Value Taken Time  BP 92/47 06/24/23 0800  Temp    Pulse 64 06/24/23 0801  Resp 11 06/24/23 0800  SpO2 96 % 06/24/23 0801  Vitals shown include unfiled device data.  Last Pain:  Vitals:   06/24/23 0639  TempSrc: Temporal  PainSc: 0-No pain         Complications: No notable events documented.

## 2023-06-24 NOTE — Anesthesia Postprocedure Evaluation (Signed)
 Anesthesia Post Note  Patient: April Solis  Procedure(s) Performed: COLONOSCOPY WITH PROPOFOL  Patient location during evaluation: PACU Anesthesia Type: General Level of consciousness: awake and awake and alert Pain management: satisfactory to patient Vital Signs Assessment: post-procedure vital signs reviewed and stable Respiratory status: spontaneous breathing Cardiovascular status: stable Anesthetic complications: no   No notable events documented.   Last Vitals:  Vitals:   06/24/23 0801 06/24/23 0810  BP: (!) 92/47 111/69  Pulse:    Resp:    Temp: (!) 36 C   SpO2:      Last Pain:  Vitals:   06/24/23 0821  TempSrc:   PainSc: 0-No pain                 VAN STAVEREN,Mccall Will

## 2023-06-24 NOTE — Anesthesia Procedure Notes (Signed)
 Procedure Name: MAC Date/Time: 06/24/2023 7:34 AM  Performed by: Nelle Don, CRNAPre-anesthesia Checklist: Patient identified, Emergency Drugs available, Suction available and Patient being monitored Oxygen Delivery Method: Nasal cannula

## 2023-06-28 DIAGNOSIS — H524 Presbyopia: Secondary | ICD-10-CM | POA: Diagnosis not present

## 2023-06-28 DIAGNOSIS — H52223 Regular astigmatism, bilateral: Secondary | ICD-10-CM | POA: Diagnosis not present

## 2023-06-28 DIAGNOSIS — E119 Type 2 diabetes mellitus without complications: Secondary | ICD-10-CM | POA: Diagnosis not present

## 2023-06-28 DIAGNOSIS — H5203 Hypermetropia, bilateral: Secondary | ICD-10-CM | POA: Diagnosis not present

## 2023-06-28 DIAGNOSIS — H401133 Primary open-angle glaucoma, bilateral, severe stage: Secondary | ICD-10-CM | POA: Diagnosis not present

## 2023-06-28 LAB — HM DIABETES EYE EXAM

## 2023-06-30 ENCOUNTER — Encounter: Payer: Self-pay | Admitting: Internal Medicine

## 2023-07-12 DIAGNOSIS — K080 Exfoliation of teeth due to systemic causes: Secondary | ICD-10-CM | POA: Diagnosis not present

## 2023-07-15 ENCOUNTER — Other Ambulatory Visit: Payer: Medicare Other

## 2023-07-15 DIAGNOSIS — E1165 Type 2 diabetes mellitus with hyperglycemia: Secondary | ICD-10-CM | POA: Diagnosis not present

## 2023-07-15 DIAGNOSIS — E1169 Type 2 diabetes mellitus with other specified complication: Secondary | ICD-10-CM | POA: Diagnosis not present

## 2023-07-15 DIAGNOSIS — E785 Hyperlipidemia, unspecified: Secondary | ICD-10-CM | POA: Diagnosis not present

## 2023-07-15 LAB — LIPID PANEL
Cholesterol: 114 mg/dL (ref 0–200)
HDL: 39.8 mg/dL (ref >39.00)
LDL Cholesterol: 39 mg/dL (ref 0–99)
NonHDL: 74.29
Total CHOL/HDL Ratio: 3
Triglycerides: 178 mg/dL — ABNORMAL HIGH (ref 0.0–149.0)
VLDL: 35.6 mg/dL (ref 0.0–40.0)

## 2023-07-15 LAB — BASIC METABOLIC PANEL WITH GFR
BUN: 24 mg/dL — ABNORMAL HIGH (ref 6–23)
CO2: 27 meq/L (ref 19–32)
Calcium: 9.3 mg/dL (ref 8.4–10.5)
Chloride: 103 meq/L (ref 96–112)
Creatinine, Ser: 0.9 mg/dL (ref 0.40–1.20)
GFR: 66.05 mL/min (ref >60.00)
Glucose, Bld: 151 mg/dL — ABNORMAL HIGH (ref 70–99)
Potassium: 4.5 meq/L (ref 3.5–5.1)
Sodium: 139 meq/L (ref 135–145)

## 2023-07-15 LAB — HEPATIC FUNCTION PANEL
ALT: 29 U/L (ref 0–35)
AST: 23 U/L (ref 0–37)
Albumin: 4.6 g/dL (ref 3.5–5.2)
Alkaline Phosphatase: 67 U/L (ref 39–117)
Bilirubin, Direct: 0.1 mg/dL (ref 0.0–0.3)
Total Bilirubin: 0.5 mg/dL (ref 0.2–1.2)
Total Protein: 6.8 g/dL (ref 6.0–8.3)

## 2023-07-15 LAB — HEMOGLOBIN A1C: Hgb A1c MFr Bld: 6.5 % (ref 4.6–6.5)

## 2023-07-19 ENCOUNTER — Encounter: Payer: Self-pay | Admitting: Internal Medicine

## 2023-07-19 ENCOUNTER — Ambulatory Visit (INDEPENDENT_AMBULATORY_CARE_PROVIDER_SITE_OTHER): Payer: Medicare Other | Admitting: Internal Medicine

## 2023-07-19 VITALS — BP 110/68 | HR 90 | Temp 98.0°F | Resp 16 | Ht 62.0 in | Wt 166.4 lb

## 2023-07-19 DIAGNOSIS — E1159 Type 2 diabetes mellitus with other circulatory complications: Secondary | ICD-10-CM | POA: Diagnosis not present

## 2023-07-19 DIAGNOSIS — E785 Hyperlipidemia, unspecified: Secondary | ICD-10-CM | POA: Diagnosis not present

## 2023-07-19 DIAGNOSIS — Z7984 Long term (current) use of oral hypoglycemic drugs: Secondary | ICD-10-CM | POA: Diagnosis not present

## 2023-07-19 DIAGNOSIS — F439 Reaction to severe stress, unspecified: Secondary | ICD-10-CM

## 2023-07-19 DIAGNOSIS — I480 Paroxysmal atrial fibrillation: Secondary | ICD-10-CM

## 2023-07-19 DIAGNOSIS — I152 Hypertension secondary to endocrine disorders: Secondary | ICD-10-CM

## 2023-07-19 DIAGNOSIS — E1169 Type 2 diabetes mellitus with other specified complication: Secondary | ICD-10-CM | POA: Diagnosis not present

## 2023-07-19 DIAGNOSIS — E1165 Type 2 diabetes mellitus with hyperglycemia: Secondary | ICD-10-CM

## 2023-07-19 DIAGNOSIS — Z1231 Encounter for screening mammogram for malignant neoplasm of breast: Secondary | ICD-10-CM

## 2023-07-19 MED ORDER — APIXABAN 5 MG PO TABS: 5.0000 mg | ORAL_TABLET | Freq: Two times a day (BID) | ORAL | 3 refills | Status: AC

## 2023-07-19 MED ORDER — METOPROLOL TARTRATE 25 MG PO TABS: 25.0000 mg | ORAL_TABLET | Freq: Two times a day (BID) | ORAL | 3 refills | Status: DC

## 2023-07-19 MED ORDER — GLIPIZIDE ER 2.5 MG PO TB24: 2.5000 mg | ORAL_TABLET | Freq: Every day | ORAL | 3 refills | Status: AC

## 2023-07-19 MED ORDER — EMPAGLIFLOZIN 25 MG PO TABS: 25.0000 mg | ORAL_TABLET | Freq: Every day | ORAL | 3 refills | Status: AC

## 2023-07-19 MED ORDER — GE100 BLOOD GLUCOSE TEST VI STRP: ORAL_STRIP | 2 refills | Status: AC

## 2023-07-19 MED ORDER — LISINOPRIL 10 MG PO TABS: 10.0000 mg | ORAL_TABLET | Freq: Every day | ORAL | 3 refills | Status: DC

## 2023-07-19 MED ORDER — AMLODIPINE BESYLATE 5 MG PO TABS: 5.0000 mg | ORAL_TABLET | Freq: Every day | ORAL | 3 refills | Status: DC

## 2023-07-19 NOTE — Assessment & Plan Note (Signed)
 Overall appears to be doing well.  Follow.

## 2023-07-19 NOTE — Assessment & Plan Note (Signed)
 Recent A1c improved 6.5. she has been watching her diet. Continue low carb diet and exercise. Has been walking more and doing more exercise. Continue jardiance, metformin and glipizide.

## 2023-07-19 NOTE — Assessment & Plan Note (Signed)
 Continues on eliquis and metoprolol. Stable. Rate controlled.

## 2023-07-19 NOTE — Progress Notes (Signed)
 Subjective:    [name], female    DOB: 09-12-1955, 68 y.o.   MRN: [id number]  Patient here for  Chief Complaint  Patient presents with   Medical Management of Chronic Issues    HPI Here for a scheduled follow up. Had colonoscopy 06/24/23 - diverticulosis in the sigmoid colon, stool in the entire colon. Recommended f/u colonoscopy in 6-12 months. Continues on metoprolol and eliquis - with history of afib. No increased heart rate or palpitations reported. Continues on jardiance and metformin. Started on low dose glipizide last visit. Recent A1c 6.5. improved. She has been watching her diet. Some allergy symptoms. Controlling with saline nasal spary. No chest congestion or sob. No abdominal pain or bowel change.    Past Medical History:  Diagnosis Date   Achilles rupture, right    Contact dermatitis and eczema due to plant    Diabetes mellitus without complication (HCC)    Environmental allergies    Glaucoma    History of chicken pox    Hyperlipidemia    Hypertension    Osteopenia    Paroxysmal A-fib (HCC)    Past Surgical History:  Procedure Laterality Date   ACHILLES TENDON REPAIR Right    BREAST BIOPSY Right 1999   neg   COLONOSCOPY WITH PROPOFOL N/A 12/12/2017   Procedure: COLONOSCOPY WITH PROPOFOL;  Surgeon: Scot Jun, MD;  Location: [facility] ENDOSCOPY;  Service: Endoscopy;  Laterality: N/A;   COLONOSCOPY WITH PROPOFOL N/A 06/24/2023   Procedure: COLONOSCOPY WITH PROPOFOL;  Surgeon: [name], DO;  Location: [facility] ENDOSCOPY;  Service: Gastroenterology;  Laterality: N/A;  DM, 1ST CASE, PLEASE   TUBAL LIGATION     Family History  Problem Relation Age of Onset   Colon polyps Mother    Alzheimer's disease Mother    Heart attack Father 59   Hypertension Father    Diabetes Father    Hypertension Sister    Diabetes Sister        x2   COPD Sister    Diabetes Sister    Hypertension Sister    Glaucoma Sister    Arrhythmia Son         Faster heart beat than normal   Breast cancer Neg Hx    Colon cancer Neg Hx    Social History   Socioeconomic History   Marital status: Married    Spouse name: Not on file   Number of children: 3   Years of education: Not on file   Highest education level: Some college, no degree  Occupational History    Employer: [employer]  Tobacco Use   Smoking status: Never   Smokeless tobacco: Never  Vaping Use   Vaping status: Never Used  Substance and Sexual Activity   Alcohol use: No    Alcohol/week: 0.0 standard drinks of alcohol   Drug use: No   Sexual activity: Not on file  Other Topics Concern   Not on file  Social History Narrative   Married   Social Drivers of Health   Financial Resource Strain: Low Risk  (04/11/2023)   Overall Financial Resource Strain (CARDIA)    Difficulty of Paying Living Expenses: Not very hard  Food Insecurity: No Food Insecurity (04/11/2023)   Hunger Vital Sign    Worried About Running Out of Food in the Last Year: Never true    Ran Out of Food in the Last Year: Never true  Transportation Needs: No Transportation Needs (04/11/2023)   PRAPARE -  Administrator, Civil Service (Medical): No    Lack of Transportation (Non-Medical): No  Physical Activity: Insufficiently Active (04/11/2023)   Exercise Vital Sign    Days of Exercise per Week: 4 days    Minutes of Exercise per Session: 30 min  Stress: No Stress Concern Present (04/11/2023)   [employer] of Occupational Health - Occupational Stress Questionnaire    Feeling of Stress : Not at all  Social Connections: Socially Integrated (04/11/2023)   Social Connection and Isolation Panel [NHANES]    Frequency of Communication with Friends and Family: More than three times a week    Frequency of Social Gatherings with Friends and Family: Twice a week    Attends Religious Services: More than 4 times per year    Active Member of [employer] or Organizations: Yes    Attends Engineer, structural: More  than 4 times per year    Marital Status: Married     Review of Systems  Constitutional:  Negative for appetite change and unexpected weight change.  HENT:  Negative for sinus pain.        Allergy symptoms.   Respiratory:  Negative for cough, chest tightness and shortness of breath.   Cardiovascular:  Negative for chest pain, palpitations and leg swelling.  Gastrointestinal:  Negative for abdominal pain, diarrhea, nausea and vomiting.  Genitourinary:  Negative for difficulty urinating and dysuria.  Musculoskeletal:  Negative for joint swelling and myalgias.  Skin:  Negative for color change and rash.  Neurological:  Negative for dizziness and headaches.  Psychiatric/Behavioral:  Negative for agitation and dysphoric mood.        Objective:     BP 110/68   Pulse 90   Temp 98 F (36.7 C)   Resp 16   Ht 5\' 2"  (1.575 m)   Wt 166 lb 6.4 oz (75.5 kg)   LMP 02/26/2008   SpO2 98%   BMI 30.43 kg/m  Wt Readings from Last 3 Encounters:  07/19/23 166 lb 6.4 oz (75.5 kg)  06/24/23 164 lb 12.8 oz (74.8 kg)  04/14/23 167 lb 6.4 oz (75.9 kg)    Physical Exam Vitals reviewed.  Constitutional:      General: She is not in acute distress.    Appearance: Normal appearance.  HENT:     Head: Normocephalic and atraumatic.     Right Ear: External ear normal.     Left Ear: External ear normal.     Mouth/Throat:     Pharynx: No oropharyngeal exudate or posterior oropharyngeal erythema.  Eyes:     General: No scleral icterus.       Right eye: No discharge.        Left eye: No discharge.     Conjunctiva/sclera: Conjunctivae normal.  Neck:     Thyroid: No thyromegaly.  Cardiovascular:     Rate and Rhythm: Normal rate and regular rhythm.  Pulmonary:     Effort: No respiratory distress.     Breath sounds: Normal breath sounds. No wheezing.  Abdominal:     General: Bowel sounds are normal.     Palpations: Abdomen is soft.     Tenderness: There is no abdominal tenderness.   Musculoskeletal:        General: No swelling or tenderness.     Cervical back: Neck supple. No tenderness.  Lymphadenopathy:     Cervical: No cervical adenopathy.  Skin:    Findings: No erythema or rash.  Neurological:  Mental Status: She is alert.  Psychiatric:        Mood and Affect: Mood normal.        Behavior: Behavior normal.      Diabetic foot exam was performed with the following findings:   No deformities, ulcerations, or other skin breakdown Normal sensation of 10g monofilament Intact posterior tibialis and dorsalis pedis pulses      Outpatient Encounter Medications as of 07/19/2023  Medication Sig   amLODipine (NORVASC) 5 MG tablet Take 1 tablet (5 mg total) by mouth daily.   apixaban (ELIQUIS) 5 MG TABS tablet Take 1 tablet (5 mg total) by mouth 2 (two) times daily.   dorzolamide-timolol (COSOPT) 22.3-6.8 MG/ML ophthalmic solution Place 1 drop into both eyes 2 (two) times daily.   doxycycline (ADOXA) 50 MG tablet Take 1-2 tabs PO QD PRN flares   empagliflozin (JARDIANCE) 25 MG TABS tablet Take 1 tablet (25 mg total) by mouth daily.   finasteride (PROSCAR) 5 MG tablet TAKE 1 TABLET BY MOUTH DAILY   Fluocinolone Acetonide Scalp 0.01 % OIL Apply to aa's scalp QD PRN flares.   glipiZIDE (GLUCOTROL XL) 2.5 MG 24 hr tablet Take 1 tablet (2.5 mg total) by mouth daily with breakfast.   glucose blood (GE100 BLOOD GLUCOSE TEST) test strip TEST TWICE DAILY   ketoconazole (NIZORAL) 2 % shampoo Shampoo into the scalp let sit several minutes then wash out. Use 2-3 days per week.   lisinopril (ZESTRIL) 10 MG tablet Take 1 tablet (10 mg total) by mouth daily.   metFORMIN (GLUCOPHAGE-XR) 500 MG 24 hr tablet TAKE TWO TABLETS BY MOUTH EVERY MORNING AND AT BEDTIME   metoprolol tartrate (LOPRESSOR) 25 MG tablet Take 1 tablet (25 mg total) by mouth 2 (two) times daily.   minoxidil (LONITEN) 2.5 MG tablet TAKE 1 TABLET BY MOUTH DAILY   Multiple Vitamin (MULTIVITAMIN) tablet Take 1  tablet by mouth daily.   nystatin cream (MYCOSTATIN) Apply 1 Application topically 2 (two) times daily.   rosuvastatin (CRESTOR) 10 MG tablet TAKE ONE TABLET EVERY DAY   timolol (BETIMOL) 0.25 % ophthalmic solution Place 1-2 drops into both eyes daily.   triamcinolone cream (KENALOG) 0.1 % APPLY TO THE AFFECTED AREA DAILY AS NEEDED   vitamin E 200 UNIT capsule Take 200 Units by mouth daily.   [DISCONTINUED] amLODipine (NORVASC) 5 MG tablet Take 1 tablet (5 mg total) by mouth daily.   [DISCONTINUED] ELIQUIS 5 MG TABS tablet TAKE ONE TABLET BY MOUTH TWICE DAILY   [DISCONTINUED] GE100 BLOOD GLUCOSE TEST test strip TEST TWICE DAILY   [DISCONTINUED] glipiZIDE (GLUCOTROL XL) 2.5 MG 24 hr tablet TAKE 1 TABLET BY MOUTH ONCE DAILY WITH BREAKFAST   [DISCONTINUED] JARDIANCE 25 MG TABS tablet TAKE ONE TABLET BY MOUTH ONCE DAILY BEFORE BREAKFAST   [DISCONTINUED] lisinopril (ZESTRIL) 10 MG tablet Take 1 tablet (10 mg total) by mouth daily.   [DISCONTINUED] metoprolol tartrate (LOPRESSOR) 25 MG tablet Take 1 tablet (25 mg total) by mouth 2 (two) times daily.   No facility-administered encounter medications on file as of 07/19/2023.     Lab Results  Component Value Date   WBC 7.0 01/04/2023   HGB 14.6 01/04/2023   HCT 44.1 01/04/2023   PLT 220.0 01/04/2023   GLUCOSE 151 (H) 07/15/2023   CHOL 114 07/15/2023   TRIG 178.0 (H) 07/15/2023   HDL 39.80 07/15/2023   LDLDIRECT 63.0 04/28/2022   LDLCALC 39 07/15/2023   ALT 29 07/15/2023   AST 23 07/15/2023  NA 139 07/15/2023   K 4.5 07/15/2023   CL 103 07/15/2023   CREATININE 0.90 07/15/2023   BUN 24 (H) 07/15/2023   CO2 27 07/15/2023   TSH 0.96 01/04/2023   HGBA1C 6.5 07/15/2023   MICROALBUR <0.7 08/31/2022       Assessment & Plan:  Encounter for screening mammogram for malignant neoplasm of breast -     3D Screening Mammogram, Left and Right; Future  Hyperlipidemia associated with type 2 diabetes mellitus (HCC) Assessment & Plan: On crestor.   Low cholesterol diet and exercise. Follow lipid panel and liver function tests.  Triglycerides improved.  Lab Results  Component Value Date   CHOL 114 07/15/2023   HDL 39.80 07/15/2023   LDLCALC 39 07/15/2023   LDLDIRECT 63.0 04/28/2022   TRIG 178.0 (H) 07/15/2023   CHOLHDL 3 07/15/2023    Orders: -     Lipid panel; Future -     Hepatic function panel; Future -     Basic metabolic panel with GFR; Future  Type 2 diabetes mellitus with hyperglycemia, without long-term current use of insulin (HCC) Assessment & Plan: Recent A1c improved 6.5. she has been watching her diet. Continue low carb diet and exercise. Has been walking more and doing more exercise. Continue jardiance, metformin and glipizide.   Orders: -     Hemoglobin A1c; Future  Hypertension associated with type 2 diabetes mellitus (HCC) Assessment & Plan: Currently on lopressor, amlodipine and lisinopril.  Follow pressures.  Follow metabolic panel. No changes in medication today    PAF (paroxysmal atrial fibrillation) (HCC) Assessment & Plan: Continues on eliquis and metoprolol. Stable. Rate controlled.    Stress Assessment & Plan: Overall appears to be doing well. Follow    Other orders -     amLODIPine Besylate; Take 1 tablet (5 mg total) by mouth daily.  Dispense: 90 tablet; Refill: 3 -     Apixaban; Take 1 tablet (5 mg total) by mouth 2 (two) times daily.  Dispense: 180 tablet; Refill: 3 -     GE100 Blood Glucose Test; TEST TWICE DAILY  Dispense: 100 each; Refill: 2 -     glipiZIDE ER; Take 1 tablet (2.5 mg total) by mouth daily with breakfast.  Dispense: 90 tablet; Refill: 3 -     Empagliflozin; Take 1 tablet (25 mg total) by mouth daily.  Dispense: 90 tablet; Refill: 3 -     Lisinopril; Take 1 tablet (10 mg total) by mouth daily.  Dispense: 90 tablet; Refill: 3 -     Metoprolol Tartrate; Take 1 tablet (25 mg total) by mouth 2 (two) times daily.  Dispense: 180 tablet; Refill: 3     Dellar Fenton, MD

## 2023-07-19 NOTE — Assessment & Plan Note (Signed)
 Currently on lopressor, amlodipine and lisinopril.  Follow pressures.  Follow metabolic panel. No changes in medication today

## 2023-07-19 NOTE — Assessment & Plan Note (Signed)
 On crestor.  Low cholesterol diet and exercise. Follow lipid panel and liver function tests.  Triglycerides improved.  Lab Results  Component Value Date   CHOL 114 07/15/2023   HDL 39.80 07/15/2023   LDLCALC 39 07/15/2023   LDLDIRECT 63.0 04/28/2022   TRIG 178.0 (H) 07/15/2023   CHOLHDL 3 07/15/2023

## 2023-08-02 ENCOUNTER — Ambulatory Visit: Payer: Medicare Other | Admitting: Dermatology

## 2023-08-02 ENCOUNTER — Encounter: Payer: Self-pay | Admitting: Dermatology

## 2023-08-02 DIAGNOSIS — L739 Follicular disorder, unspecified: Secondary | ICD-10-CM

## 2023-08-02 DIAGNOSIS — L570 Actinic keratosis: Secondary | ICD-10-CM | POA: Diagnosis not present

## 2023-08-02 DIAGNOSIS — Z79899 Other long term (current) drug therapy: Secondary | ICD-10-CM

## 2023-08-02 DIAGNOSIS — L649 Androgenic alopecia, unspecified: Secondary | ICD-10-CM | POA: Diagnosis not present

## 2023-08-02 DIAGNOSIS — L814 Other melanin hyperpigmentation: Secondary | ICD-10-CM

## 2023-08-02 DIAGNOSIS — W908XXA Exposure to other nonionizing radiation, initial encounter: Secondary | ICD-10-CM

## 2023-08-02 DIAGNOSIS — L578 Other skin changes due to chronic exposure to nonionizing radiation: Secondary | ICD-10-CM | POA: Diagnosis not present

## 2023-08-02 MED ORDER — FINASTERIDE 5 MG PO TABS: 5.0000 mg | ORAL_TABLET | Freq: Every day | ORAL | 3 refills | Status: AC

## 2023-08-02 MED ORDER — MINOXIDIL 2.5 MG PO TABS: 2.5000 mg | ORAL_TABLET | Freq: Every day | ORAL | 3 refills | Status: DC

## 2023-08-02 NOTE — Progress Notes (Signed)
 Follow-Up Visit   Subjective  April Solis is a 68 y.o. female who presents for the following:   States has an AK vs ISK on forehead to treat today. Deferred treatment at last visit.   Follow up on alopecia, currently taking finasteride  and minoxidil  for 1 1/2 years, been on full tab of each for past 12 months. Still has some mild hair shedding.  Feels like hair is getting thicker. Denies any side effects or adverse reactions to medications.  Scalp folliculitis. Using Ketoconazole  shampoo, fluocinolone  oil as directed. Taking Doxycycline  as needed for worse flares.    The following portions of the chart were reviewed this encounter and updated as appropriate: medications, allergies, medical history  Review of Systems:  No other skin or systemic complaints except as noted in HPI or Assessment and Plan.  Objective  Well appearing patient in no apparent distress; mood and affect are within normal limits.   A focused examination was performed of the following areas: Scalp and face   Relevant exam findings are noted in the Assessment and Plan.  R forehead x3 (3) Pink scaly macules    Assessment & Plan   ANDROGENETIC ALOPECIA (FEMALE PATTERN HAIR LOSS) Exam: Diffuse thinning of the crown and widening of the midline part with retention of the frontal hairline, improving compared to photos 10/13/2021, with narrowing of midline part and esp at BL temporal hairline with good regrowth.   Chronic and persistent condition with duration or expected duration over one year. Condition is symptomatic/ bothersome to patient. Not currently at goal, but improving.     Female Androgenic Alopecia is a chronic condition related to genetics and/or hormonal changes.  In women androgenetic alopecia is commonly associated with menopause but may occur any time after puberty.  It causes hair thinning primarily on the crown with widening of the part and temporal hairline recession.  Can use OTC  Rogaine  (minoxidil ) 5% solution/foam as directed.  Oral treatments in female patients who have no contraindication may include : - Low dose oral minoxidil  1.25 - 5mg  daily - Spironolactone 50 - 100mg  bid - Finasteride  2.5 - 5 mg daily Adjunctive therapies include: - Low Level Laser Light Therapy (LLLT) - Platelet-rich plasma injections (PRP) - Hair Transplants or scalp reduction    Treatment Plan: BP 99/62  Continue finasteride  5 mg 1 tablet once daily Continue minoxidil  2.5 mg  1  tablet once daily   Doses of minoxidil  for hair loss are considered 'low dose'. This is because the doses used for hair loss are a lot lower than the doses which are used for conditions such as high blood pressure (hypertension). The doses used for hypertension are 10-40mg  per day.  Side effects are uncommon at the low doses (up to 2.5 mg/day) used to treat hair loss. Potential side effects, more commonly seen at higher doses, include: Increase in hair growth (hypertrichosis) elsewhere on face and body Temporary hair shedding upon starting medication which may last up to 4 weeks Ankle swelling, fluid retention, rapid weight gain more than 5 pounds Low blood pressure and feeling lightheaded or dizzy when standing up quickly Fast or irregular heartbeat Headaches  Long term medication management.  Patient is using long term (months to years) prescription medication  to control their dermatologic condition.  These medications require periodic monitoring to evaluate for efficacy and side effects and may require periodic laboratory monitoring.    Folliculitis Exam: Scalp clear today   Chronic condition with duration or expected  duration over one year. Currently well-controlled.   Folliculitis occurs due to inflammation of the superficial hair follicle (pore), resulting in acne-like lesions (pus bumps). It can be infectious (bacterial, fungal) or noninfectious (shaving, tight clothing, heat/sweat, medications).   Folliculitis can be acute or chronic and recommended treatment depends on the underlying cause of folliculitis.    Treatment Plan:  Continue ketoconazole  2% shampoo qwk as needed Continue fluocinolone  oil qwk as needed Continue doxycycline  50 mg 1-2 times daily with food as needed for flares.    Doxycycline  should be taken with food to prevent nausea. Do not lay down for 30 minutes after taking. Be cautious with sun exposure and use good sun protection while on this medication. Pregnant women should not take this medication.     ACTINIC DAMAGE - chronic, secondary to cumulative UV radiation exposure/sun exposure over time - diffuse scaly erythematous macules with underlying dyspigmentation - Recommend daily broad spectrum sunscreen SPF 30+ to sun-exposed areas, reapply every 2 hours as needed.  - Recommend staying in the shade or wearing long sleeves, sun glasses (UVA+UVB protection) and wide brim hats (4-inch brim around the entire circumference of the hat). - Call for new or changing lesions.  LENTIGINES Exam: scattered tan macules face Due to sun exposure Treatment Plan: Benign-appearing, observe. Recommend daily broad spectrum sunscreen SPF 30+ to sun-exposed areas, reapply every 2 hours as needed.  Call for any changes   AK (ACTINIC KERATOSIS) (3) R forehead x3 (3) Actinic keratoses are precancerous spots that appear secondary to cumulative UV radiation exposure/sun exposure over time. They are chronic with expected duration over 1 year. A portion of actinic keratoses will progress to squamous cell carcinoma of the skin. It is not possible to reliably predict which spots will progress to skin cancer and so treatment is recommended to prevent development of skin cancer.  Recommend daily broad spectrum sunscreen SPF 30+ to sun-exposed areas, reapply every 2 hours as needed.  Recommend staying in the shade or wearing long sleeves, sun glasses (UVA+UVB protection) and wide brim hats  (4-inch brim around the entire circumference of the hat). Call for new or changing lesions. Destruction of lesion - R forehead x3 (3)  Destruction method: cryotherapy   Informed consent: discussed and consent obtained   Lesion destroyed using liquid nitrogen: Yes   Region frozen until ice ball extended beyond lesion: Yes   Outcome: patient tolerated procedure well with no complications   Post-procedure details: wound care instructions given   Additional details:  Prior to procedure, discussed risks of blister formation, small wound, skin dyspigmentation, or rare scar following cryotherapy. Recommend Vaseline ointment to treated areas while healing.  ANDROGENIC ALOPECIA   Related Medications minoxidil  (LONITEN ) 2.5 MG tablet Take 1 tablet (2.5 mg total) by mouth daily. finasteride  (PROSCAR ) 5 MG tablet Take 1 tablet (5 mg total) by mouth daily.   Return in about 1 year (around 08/01/2024) for AK Follow Up, Alopecia Follow Up, TBSE.  I, Randee Busing, CMA, am acting as scribe for Artemio Larry, MD.   Documentation: I have reviewed the above documentation for accuracy and completeness, and I agree with the above.  Artemio Larry, MD

## 2023-08-02 NOTE — Patient Instructions (Addendum)
 Cryotherapy Aftercare  Wash gently with soap and water everyday.   Apply Vaseline Jelly daily until healed.    Continue ketoconazole  2% shampoo qwk as needed Continue fluocinolone  oil qwk as needed Continue doxycycline  50 mg 1-2 times daily with food as needed for flares.    Doxycycline  should be taken with food to prevent nausea. Do not lay down for 30 minutes after taking. Be cautious with sun exposure and use good sun protection while on this medication. Pregnant women should not take this medication.     Continue finasteride  5 mg 1 tablet once daily Continue minoxidil  2.5 mg  1  tablet once daily   Doses of minoxidil  for hair loss are considered 'low dose'. This is because the doses used for hair loss are a lot lower than the doses which are used for conditions such as high blood pressure (hypertension). The doses used for hypertension are 10-40mg  per day.  Side effects are uncommon at the low doses (up to 2.5 mg/day) used to treat hair loss. Potential side effects, more commonly seen at higher doses, include: Increase in hair growth (hypertrichosis) elsewhere on face and body Temporary hair shedding upon starting medication which may last up to 4 weeks Ankle swelling, fluid retention, rapid weight gain more than 5 pounds Low blood pressure and feeling lightheaded or dizzy when standing up quickly Fast or irregular heartbeat Headaches    Recommend daily broad spectrum sunscreen SPF 30+ to sun-exposed areas, reapply every 2 hours as needed. Call for new or changing lesions.  Staying in the shade or wearing long sleeves, sun glasses (UVA+UVB protection) and wide brim hats (4-inch brim around the entire circumference of the hat) are also recommended for sun protection.     Due to recent changes in healthcare laws, you may see results of your pathology and/or laboratory studies on MyChart before the doctors have had a chance to review them. We understand that in some cases there may  be results that are confusing or concerning to you. Please understand that not all results are received at the same time and often the doctors may need to interpret multiple results in order to provide you with the best plan of care or course of treatment. Therefore, we ask that you please give us  2 business days to thoroughly review all your results before contacting the office for clarification. Should we see a critical lab result, you will be contacted sooner.   If You Need Anything After Your Visit  If you have any questions or concerns for your doctor, please call our main line at [phone] and press option 4 to reach your doctor's medical assistant. If no one answers, please leave a voicemail as directed and we will return your call as soon as possible. Messages left after 4 pm will be answered the following business day.   You may also send us  a message via MyChart. We typically respond to MyChart messages within 1-2 business days.  For prescription refills, please ask your pharmacy to contact our office. Our fax number is [phone].  If you have an urgent issue when the clinic is closed that cannot wait until the next business day, you can page your doctor at the number below.    Please note that while we do our best to be available for urgent issues outside of office hours, we are not available 24/7.   If you have an urgent issue and are unable to reach us , you may choose to seek medical  care at your doctor's office, retail clinic, urgent care center, or emergency room.  If you have a medical emergency, please immediately call 911 or go to the emergency department.  Pager Numbers  - Dr. Bary Likes: [phone]  - Dr. Annette Barters: [phone]  - Dr. Felipe Horton: [phone]   In the event of inclement weather, please call our main line at [phone] for an update on the status of any delays or closures.  Dermatology Medication Tips: Please keep the boxes that topical medications  come in in order to help keep track of the instructions about where and how to use these. Pharmacies typically print the medication instructions only on the boxes and not directly on the medication tubes.   If your medication is too expensive, please contact our office at [phone] option 4 or send us  a message through MyChart.   We are unable to tell what your co-pay for medications will be in advance as this is different depending on your insurance coverage. However, we may be able to find a substitute medication at lower cost or fill out paperwork to get insurance to cover a needed medication.   If a prior authorization is required to get your medication covered by your insurance company, please allow us  1-2 business days to complete this process.  Drug prices often vary depending on where the prescription is filled and some pharmacies may offer cheaper prices.  The website www.goodrx.com contains coupons for medications through different pharmacies. The prices here do not account for what the cost may be with help from insurance (it may be cheaper with your insurance), but the website can give you the price if you did not use any insurance.  - You can print the associated coupon and take it with your prescription to the pharmacy.  - You may also stop by our office during regular business hours and pick up a GoodRx coupon card.  - If you need your prescription sent electronically to a different pharmacy, notify our office through [facility] or by phone at [phone] option 4.     Si Usted Necesita Algo Despus de Su Visita  Tambin puede enviarnos un mensaje a travs de Clinical cytogeneticist. Por lo general respondemos a los mensajes de MyChart en el transcurso de 1 a 2 das hbiles.  Para renovar recetas, por favor pida a su farmacia que se ponga en contacto con nuestra oficina. Franz Jacks de fax es [place] [phone].  Si tiene un asunto urgente cuando la clnica est cerrada y que no  puede esperar hasta el siguiente da hbil, puede llamar/localizar a su doctor(a) al nmero que aparece a continuacin.   Por favor, tenga en cuenta que aunque hacemos todo lo posible para estar disponibles para asuntos urgentes fuera del horario de [place], no estamos disponibles las 24 horas del da, los 7 [address]  [address] de la [place].   Si tiene un problema urgente y no puede comunicarse con nosotros, puede optar por buscar atencin mdica  en el consultorio de su doctor(a), en una clnica privada, en un centro de atencin urgente o en una sala de emergencias.  Si tiene Engineer, drilling, por favor llame inmediatamente al 911 o vaya a la sala de emergencias.  Nmeros de bper  - Dr. Bary Likes: [phone]  - Dra. Annette Barters: [phone]  - Dr. Felipe Horton: [phone]   En caso de inclemencias del tiempo, por favor llame a Lajuan Pila principal al [phone] para una actualizacin sobre el [place] de cualquier retraso o cierre.  Consejos para  la medicacin en dermatologa: Por favor, guarde las cajas en las que vienen los medicamentos de uso tpico para ayudarle a seguir las instrucciones sobre dnde y cmo usarlos. Las farmacias generalmente imprimen las instrucciones del medicamento slo en las cajas y no directamente en los tubos del [place].   Si su medicamento es muy caro, por favor, pngase en contacto con Bettyjane Brunet llamando al [phone] y presione la opcin 4 o envenos un mensaje a travs de Clinical cytogeneticist.   No podemos decirle cul ser su copago por los medicamentos por adelantado ya que esto es diferente dependiendo de la cobertura de su seguro. Sin embargo, es posible que podamos encontrar un medicamento sustituto a Audiological scientist un formulario para que el seguro cubra el medicamento que se considera necesario.   Si se requiere una autorizacin previa para que su compaa de seguros [place] su medicamento, por favor permtanos de 1 a 2 das hbiles para completar este  proceso.  Los precios de los medicamentos varan con frecuencia dependiendo del Environmental consultant de dnde se surte la receta y alguna farmacias pueden ofrecer precios ms baratos.  El sitio web www.goodrx.com tiene cupones para medicamentos de Health and safety inspector. Los precios aqu no tienen en cuenta lo que podra costar con la ayuda del seguro (puede ser ms barato con su seguro), pero el sitio web puede darle el precio si no utiliz Tourist information centre manager.  - Puede imprimir el cupn correspondiente y llevarlo con su receta a la farmacia.  - Tambin puede pasar por nuestra oficina durante el horario de atencin regular y Education officer, museum una tarjeta de cupones de GoodRx.  - Si necesita que su receta se enve electrnicamente a una farmacia diferente, informe a nuestra oficina a travs de MyChart de [place] o por telfono llamando al [phone] y presione la opcin 4.

## 2023-08-08 ENCOUNTER — Ambulatory Visit (INDEPENDENT_AMBULATORY_CARE_PROVIDER_SITE_OTHER): Admitting: Dermatology

## 2023-08-08 DIAGNOSIS — L0100 Impetigo, unspecified: Secondary | ICD-10-CM | POA: Diagnosis not present

## 2023-08-08 MED ORDER — CEFDINIR 300 MG PO CAPS: 300.0000 mg | ORAL_CAPSULE | Freq: Two times a day (BID) | ORAL | 0 refills | Status: AC

## 2023-08-08 MED ORDER — MUPIROCIN 2 % EX OINT: 1.0000 | TOPICAL_OINTMENT | Freq: Two times a day (BID) | CUTANEOUS | 1 refills | Status: DC

## 2023-08-08 NOTE — Patient Instructions (Signed)

## 2023-08-08 NOTE — Progress Notes (Signed)
   Follow-Up Visit   Subjective  April Solis is a 68 y.o. female who presents for the following: swelling at LN2 site R forehead x 3 days, LN2 was 08/02/23, mild itching  The following portions of the chart were reviewed this encounter and updated as appropriate: medications, allergies, medical history  Review of Systems:  No other skin or systemic complaints except as noted in HPI or Assessment and Plan.  Objective  Well appearing patient in no apparent distress; mood and affect are within normal limits.   A focused examination was performed of the following areas: face  Relevant exam findings are noted in the Assessment and Plan.    Assessment & Plan   IMPETIGO R forehead Exam: periocular edema, pink patch with yellow crusting R upper forehead  Treatment Plan: Bacterial culture today Start Mupirocin oint bid to aa forehead Start Omnicef  300mg  1 po bid x 7 days   IMPETIGO   Related Procedures Aerobic culture  Return if symptoms worsen or fail to improve.  I, Rollie Clipper, RMA, am acting as scribe for Artemio Larry, MD .   Documentation: I have reviewed the above documentation for accuracy and completeness, and I agree with the above.  Artemio Larry, MD

## 2023-08-16 ENCOUNTER — Ambulatory Visit: Payer: Self-pay | Admitting: Dermatology

## 2023-08-16 LAB — AEROBIC CULTURE

## 2023-10-05 NOTE — Addendum Note (Signed)
 Addended by: Tait Balistreri on: 10/05/2023 11:24 AM   Modules accepted: Orders

## 2023-10-14 ENCOUNTER — Other Ambulatory Visit: Payer: Self-pay | Admitting: Internal Medicine

## 2023-11-18 ENCOUNTER — Other Ambulatory Visit (INDEPENDENT_AMBULATORY_CARE_PROVIDER_SITE_OTHER)

## 2023-11-18 DIAGNOSIS — E1169 Type 2 diabetes mellitus with other specified complication: Secondary | ICD-10-CM

## 2023-11-18 DIAGNOSIS — E785 Hyperlipidemia, unspecified: Secondary | ICD-10-CM | POA: Diagnosis not present

## 2023-11-18 DIAGNOSIS — E1165 Type 2 diabetes mellitus with hyperglycemia: Secondary | ICD-10-CM

## 2023-11-18 LAB — HEPATIC FUNCTION PANEL
ALT: 24 U/L (ref 0–35)
AST: 20 U/L (ref 0–37)
Albumin: 4.3 g/dL (ref 3.5–5.2)
Alkaline Phosphatase: 57 U/L (ref 39–117)
Bilirubin, Direct: 0.1 mg/dL (ref 0.0–0.3)
Total Bilirubin: 0.4 mg/dL (ref 0.2–1.2)
Total Protein: 6.6 g/dL (ref 6.0–8.3)

## 2023-11-18 LAB — BASIC METABOLIC PANEL WITH GFR
BUN: 16 mg/dL (ref 6–23)
CO2: 26 meq/L (ref 19–32)
Calcium: 9 mg/dL (ref 8.4–10.5)
Chloride: 104 meq/L (ref 96–112)
Creatinine, Ser: 0.75 mg/dL (ref 0.40–1.20)
GFR: 82.01 mL/min (ref >60.00)
Glucose, Bld: 151 mg/dL — ABNORMAL HIGH (ref 70–99)
Potassium: 4.5 meq/L (ref 3.5–5.1)
Sodium: 140 meq/L (ref 135–145)

## 2023-11-18 LAB — HEMOGLOBIN A1C: Hgb A1c MFr Bld: 6.7 % — ABNORMAL HIGH (ref 4.6–6.5)

## 2023-11-18 LAB — LIPID PANEL
Cholesterol: 101 mg/dL (ref 0–200)
HDL: 33.7 mg/dL — ABNORMAL LOW (ref >39.00)
LDL Cholesterol: 30 mg/dL (ref 0–99)
NonHDL: 66.91
Total CHOL/HDL Ratio: 3
Triglycerides: 185 mg/dL — ABNORMAL HIGH (ref 0.0–149.0)
VLDL: 37 mg/dL (ref 0.0–40.0)

## 2023-11-21 ENCOUNTER — Ambulatory Visit: Payer: Self-pay | Admitting: Internal Medicine

## 2023-11-22 ENCOUNTER — Ambulatory Visit (INDEPENDENT_AMBULATORY_CARE_PROVIDER_SITE_OTHER): Admitting: Internal Medicine

## 2023-11-22 ENCOUNTER — Encounter: Payer: Self-pay | Admitting: Internal Medicine

## 2023-11-22 VITALS — BP 112/70 | HR 80 | Resp 16 | Ht 62.0 in | Wt 168.6 lb

## 2023-11-22 DIAGNOSIS — E785 Hyperlipidemia, unspecified: Secondary | ICD-10-CM | POA: Diagnosis not present

## 2023-11-22 DIAGNOSIS — I480 Paroxysmal atrial fibrillation: Secondary | ICD-10-CM

## 2023-11-22 DIAGNOSIS — F439 Reaction to severe stress, unspecified: Secondary | ICD-10-CM

## 2023-11-22 DIAGNOSIS — Z7984 Long term (current) use of oral hypoglycemic drugs: Secondary | ICD-10-CM

## 2023-11-22 DIAGNOSIS — E1169 Type 2 diabetes mellitus with other specified complication: Secondary | ICD-10-CM

## 2023-11-22 DIAGNOSIS — E1159 Type 2 diabetes mellitus with other circulatory complications: Secondary | ICD-10-CM

## 2023-11-22 DIAGNOSIS — I152 Hypertension secondary to endocrine disorders: Secondary | ICD-10-CM

## 2023-11-22 DIAGNOSIS — E1165 Type 2 diabetes mellitus with hyperglycemia: Secondary | ICD-10-CM

## 2023-11-22 LAB — HM DIABETES FOOT EXAM

## 2023-11-22 NOTE — Assessment & Plan Note (Signed)
 Currently on lopressor, amlodipine and lisinopril.  Follow pressures.  Follow metabolic panel. No changes in medication today

## 2023-11-22 NOTE — Assessment & Plan Note (Signed)
 On crestor .  Low cholesterol diet and exercise. Follow lipid panel and liver function tests. Triglycerides increased slightly and HDL decreased. Plans to start exercising more. Follow.  Lab Results  Component Value Date   CHOL 101 11/18/2023   HDL 33.70 (L) 11/18/2023   LDLCALC 30 11/18/2023   LDLDIRECT 63.0 04/28/2022   TRIG 185.0 (H) 11/18/2023   CHOLHDL 3 11/18/2023

## 2023-11-22 NOTE — Progress Notes (Signed)
 Subjective:    [name], female    DOB: 1955/04/16, 68 y.o.   MRN: [id number]  Patient here for  Chief Complaint  Patient presents with   Medical Management of Chronic Issues    HPI Here for a scheduled follow up. Had colonoscopy 06/24/23 - diverticulosis in the sigmoid colon, stool in the entire colon. Recommended f/u colonoscopy in 6-12 months. Scheduled for f/u with GI in March. Continues on metoprolol  and eliquis  - with history of afib. No increased heart rate or palpitations reported. Continues on jardiance  and metformin . Also on low dose glipizide .  Reviewed outside sugar readings.  Some variation.  Most mornings sugars averaging around the 130s to 140s.  Recently she has had some 150s to 170s.  We discussed continuing low-carb diet and exercise.  She has not been able to exercise as much lately due to the heat.  She is trying to walk around her house.  Plans to start exercising more.  Had f/u with dermatology 08/02/23 - f/u alopecia. Continues on finasteride  and minoxidil .  She denies any shortness of breath.  She is scheduled to see her dentist in September.  She is also scheduled for her eyes checked in September.  She is scheduled to have a mammogram and bone density in October.   Past Medical History:  Diagnosis Date   Achilles rupture, right    Contact dermatitis and eczema due to plant    Diabetes mellitus without complication (HCC)    Environmental allergies    Glaucoma    History of chicken pox    Hyperlipidemia    Hypertension    Osteopenia    Paroxysmal A-fib (HCC)    Past Surgical History:  Procedure Laterality Date   ACHILLES TENDON REPAIR Right    BREAST BIOPSY Right 1999   neg   COLONOSCOPY WITH PROPOFOL  N/A 12/12/2017   Procedure: COLONOSCOPY WITH PROPOFOL ;  Surgeon: Viktoria Lamar DASEN, MD;  Location: [facility] ENDOSCOPY;  Service: Endoscopy;  Laterality: N/A;   COLONOSCOPY WITH PROPOFOL  N/A 06/24/2023   Procedure: COLONOSCOPY WITH  PROPOFOL ;  Surgeon: Onita Elspeth Sharper, DO;  Location[facility] ENDOSCOPY;  Service: Gastroenterology;  Laterality: N/A;  DM, 1ST CASE, PLEASE   TUBAL LIGATION     Family History  Problem Relation Age of Onset   Colon polyps Mother    Alzheimer's disease Mother    Heart attack Father 39   Hypertension Father    Diabetes Father    Hypertension Sister    Diabetes Sister        x2   COPD Sister    Diabetes Sister    Hypertension Sister    Glaucoma Sister    Arrhythmia Son        Faster heart beat than normal   Breast cancer Neg Hx    Colon cancer Neg Hx    Social History   Socioeconomic History   Marital status: Married    Spouse name: Not on file   Number of children: 3   Years of education: Not on file   Highest education level: Some college, no degree  Occupational History    Employer: [employer]  Tobacco Use   Smoking status: Never   Smokeless tobacco: Never  Vaping Use   Vaping status: Never Used  Substance and Sexual Activity   Alcohol use: No    Alcohol/week: 0.0 standard drinks of alcohol   Drug use: No   Sexual activity: Not on file  Other Topics Concern  Not on file  Social History Narrative   Married   Social Drivers of Health   Financial Resource Strain: Low Risk  (11/21/2023)   Overall Financial Resource Strain (CARDIA)    Difficulty of Paying Living Expenses: Not hard at all  Food Insecurity: No Food Insecurity (11/21/2023)   Hunger Vital Sign    Worried About Running Out of Food in the Last Year: Never true    Ran Out of Food in the Last Year: Never true  Transportation Needs: Unknown (11/21/2023)   PRAPARE - Administrator, Civil Service (Medical): Not on file    Lack of Transportation (Non-Medical): No  Physical Activity: Insufficiently Active (04/11/2023)   Exercise Vital Sign    Days of Exercise per Week: 4 days    Minutes of Exercise per Session: 30 min  Stress: No Stress Concern Present (11/21/2023)   [employer] of Occupational  Health - Occupational Stress Questionnaire    Feeling of Stress: Not at all  Social Connections: Socially Integrated (11/21/2023)   Social Connection and Isolation Panel    Frequency of Communication with Friends and Family: More than three times a week    Frequency of Social Gatherings with Friends and Family: More than three times a week    Attends Religious Services: More than 4 times per year    Active Member of [employer] or Organizations: Yes    Attends Engineer, structural: More than 4 times per year    Marital Status: Married     Review of Systems  Constitutional:  Negative for appetite change and unexpected weight change.  HENT:  Negative for congestion and sinus pressure.   Respiratory:  Negative for cough, chest tightness and shortness of breath.   Cardiovascular:  Negative for chest pain, palpitations and leg swelling.  Gastrointestinal:  Negative for abdominal pain, diarrhea, nausea and vomiting.  Genitourinary:  Negative for difficulty urinating and dysuria.  Musculoskeletal:  Negative for joint swelling and myalgias.  Skin:  Negative for color change and rash.  Neurological:  Negative for dizziness and headaches.  Psychiatric/Behavioral:  Negative for agitation and dysphoric mood.        Objective:     BP 112/70   Pulse 80   Resp 16   Ht 5' 2 (1.575 m)   Wt 168 lb 9.6 oz (76.5 kg)   LMP 02/26/2008   SpO2 98%   BMI 30.84 kg/m  Wt Readings from Last 3 Encounters:  11/22/23 168 lb 9.6 oz (76.5 kg)  07/19/23 166 lb 6.4 oz (75.5 kg)  06/24/23 164 lb 12.8 oz (74.8 kg)    Physical Exam Vitals reviewed.  Constitutional:      General: She is not in acute distress.    Appearance: Normal appearance.  HENT:     Head: Normocephalic and atraumatic.     Right Ear: External ear normal.     Left Ear: External ear normal.     Mouth/Throat:     Pharynx: No oropharyngeal exudate or posterior oropharyngeal erythema.  Eyes:     General: No scleral icterus.        Right eye: No discharge.        Left eye: No discharge.     Conjunctiva/sclera: Conjunctivae normal.  Neck:     Thyroid : No thyromegaly.  Cardiovascular:     Rate and Rhythm: Normal rate and regular rhythm.  Pulmonary:     Effort: No respiratory distress.     Breath sounds: Normal breath  sounds. No wheezing.  Abdominal:     General: Bowel sounds are normal.     Palpations: Abdomen is soft.     Tenderness: There is no abdominal tenderness.  Musculoskeletal:        General: No swelling or tenderness.     Cervical back: Neck supple. No tenderness.  Lymphadenopathy:     Cervical: No cervical adenopathy.  Skin:    Findings: No erythema or rash.  Neurological:     Mental Status: She is alert.  Psychiatric:        Mood and Affect: Mood normal.        Behavior: Behavior normal.      Diabetic foot exam was performed with the following findings:   No deformities, ulcerations, or other skin breakdown Normal sensation of 10g monofilament Intact posterior tibialis and dorsalis pedis pulses      Outpatient Encounter Medications as of 11/22/2023  Medication Sig   amLODipine  (NORVASC ) 5 MG tablet Take 1 tablet (5 mg total) by mouth daily.   apixaban  (ELIQUIS ) 5 MG TABS tablet Take 1 tablet (5 mg total) by mouth 2 (two) times daily.   dorzolamide-timolol (COSOPT) 22.3-6.8 MG/ML ophthalmic solution Place 1 drop into both eyes 2 (two) times daily.   doxycycline  (ADOXA) 50 MG tablet Take 1-2 tabs PO QD PRN flares   empagliflozin  (JARDIANCE ) 25 MG TABS tablet Take 1 tablet (25 mg total) by mouth daily.   finasteride  (PROSCAR ) 5 MG tablet Take 1 tablet (5 mg total) by mouth daily.   Fluocinolone  Acetonide Scalp 0.01 % OIL Apply to aa's scalp QD PRN flares.   glipiZIDE  (GLUCOTROL  XL) 2.5 MG 24 hr tablet Take 1 tablet (2.5 mg total) by mouth daily with breakfast.   glucose blood (GE100 BLOOD GLUCOSE TEST) test strip TEST TWICE DAILY   ketoconazole  (NIZORAL ) 2 % shampoo Shampoo into the scalp  let sit several minutes then wash out. Use 2-3 days per week.   lisinopril  (ZESTRIL ) 10 MG tablet Take 1 tablet (10 mg total) by mouth daily.   metFORMIN  (GLUCOPHAGE -XR) 500 MG 24 hr tablet TAKE TWO TABLETS BY MOUTH EVERY MORNING AND AT BEDTIME   metoprolol  tartrate (LOPRESSOR ) 25 MG tablet Take 1 tablet (25 mg total) by mouth 2 (two) times daily.   minoxidil  (LONITEN ) 2.5 MG tablet Take 1 tablet (2.5 mg total) by mouth daily.   Multiple Vitamin (MULTIVITAMIN) tablet Take 1 tablet by mouth daily.   mupirocin  ointment (BACTROBAN ) 2 % Apply 1 Application topically 2 (two) times daily. Bid to right forehead   nystatin  cream (MYCOSTATIN ) Apply 1 Application topically 2 (two) times daily.   rosuvastatin  (CRESTOR ) 10 MG tablet TAKE ONE TABLET EVERY DAY   timolol (BETIMOL) 0.25 % ophthalmic solution Place 1-2 drops into both eyes daily.   triamcinolone  cream (KENALOG ) 0.1 % APPLY TO THE AFFECTED AREA DAILY AS NEEDED   vitamin E 200 UNIT capsule Take 200 Units by mouth daily.   No facility-administered encounter medications on file as of 11/22/2023.     Lab Results  Component Value Date   WBC 7.0 01/04/2023   HGB 14.6 01/04/2023   HCT 44.1 01/04/2023   PLT 220.0 01/04/2023   GLUCOSE 151 (H) 11/18/2023   CHOL 101 11/18/2023   TRIG 185.0 (H) 11/18/2023   HDL 33.70 (L) 11/18/2023   LDLDIRECT 63.0 04/28/2022   LDLCALC 30 11/18/2023   ALT 24 11/18/2023   AST 20 11/18/2023   NA 140 11/18/2023   K 4.5 11/18/2023  CL 104 11/18/2023   CREATININE 0.75 11/18/2023   BUN 16 11/18/2023   CO2 26 11/18/2023   TSH 0.96 01/04/2023   HGBA1C 6.7 (H) 11/18/2023       Assessment & Plan:  PAF (paroxysmal atrial fibrillation) (HCC) Assessment & Plan: Continues on Eliquis  and metoprolol .  Appears to be in sinus rhythm today.  Currently without symptoms.  Follow.   Hyperlipidemia associated with type 2 diabetes mellitus (HCC) Assessment & Plan: On crestor .  Low cholesterol diet and exercise. Follow  lipid panel and liver function tests. Triglycerides increased slightly and HDL decreased. Plans to start exercising more. Follow.  Lab Results  Component Value Date   CHOL 101 11/18/2023   HDL 33.70 (L) 11/18/2023   LDLCALC 30 11/18/2023   LDLDIRECT 63.0 04/28/2022   TRIG 185.0 (H) 11/18/2023   CHOLHDL 3 11/18/2023    Orders: -     Lipid panel; Future -     Hepatic function panel; Future -     Basic metabolic panel with GFR; Future  Type 2 diabetes mellitus with hyperglycemia, without long-term current use of insulin (HCC) Assessment & Plan: Recent A1c increased - 6.7. low carb diet and exercise. Follow met b and A1c. Continue jardiance , metformin  and glipizide . No changes in medication today.   Orders: -     Hemoglobin A1c; Future -     Microalbumin / creatinine urine ratio; Future  Stress Assessment & Plan: Overall appears to be handling stress well.  Follow.   Hypertension associated with type 2 diabetes mellitus (HCC) Assessment & Plan: Currently on lopressor , amlodipine  and lisinopril .  Follow pressures.  Follow metabolic panel. No changes in medication today.       Allena Hamilton, MD

## 2023-11-22 NOTE — Assessment & Plan Note (Signed)
 Recent A1c increased - 6.7. low carb diet and exercise. Follow met b and A1c. Continue jardiance , metformin  and glipizide . No changes in medication today.

## 2023-11-22 NOTE — Assessment & Plan Note (Signed)
Overall appears to be handling stress well.  Follow.  

## 2023-11-22 NOTE — Assessment & Plan Note (Signed)
 Continues on Eliquis  and metoprolol .  Appears to be in sinus rhythm today.  Currently without symptoms.  Follow.

## 2023-12-27 DIAGNOSIS — H401133 Primary open-angle glaucoma, bilateral, severe stage: Secondary | ICD-10-CM | POA: Diagnosis not present

## 2024-01-04 NOTE — Progress Notes (Signed)
 April Solis                                          MRN: [id number]   01/04/2024   The [facility] Quality Team Specialist reviewed this patient medical record for the purposes of chart review for care gap closure. The following were reviewed: chart review for care gap closure-kidney health evaluation for diabetes:eGFR  and uACR.    [facility]I Quality Team

## 2024-01-10 ENCOUNTER — Ambulatory Visit
Admission: RE | Admit: 2024-01-10 | Discharge: 2024-01-10 | Disposition: A | Discharge status: Home / Self Care | Source: Ambulatory Visit | Attending: Internal Medicine | Admitting: Internal Medicine

## 2024-01-10 ENCOUNTER — Encounter: Payer: Self-pay | Admitting: Pharmacist

## 2024-01-10 DIAGNOSIS — Z780 Asymptomatic menopausal state: Secondary | ICD-10-CM | POA: Diagnosis not present

## 2024-01-10 DIAGNOSIS — Z1231 Encounter for screening mammogram for malignant neoplasm of breast: Secondary | ICD-10-CM | POA: Diagnosis not present

## 2024-01-10 DIAGNOSIS — M8589 Other specified disorders of bone density and structure, multiple sites: Secondary | ICD-10-CM | POA: Diagnosis not present

## 2024-01-11 ENCOUNTER — Ambulatory Visit: Payer: Self-pay | Admitting: Internal Medicine

## 2024-01-16 ENCOUNTER — Other Ambulatory Visit: Payer: Self-pay

## 2024-01-16 ENCOUNTER — Emergency Department

## 2024-01-16 ENCOUNTER — Inpatient Hospital Stay
Admission: EM | Admit: 2024-01-16 | Discharge: 2024-01-24 | DRG: 419 | Disposition: A | Discharge status: Home / Self Care | Attending: Internal Medicine | Admitting: Internal Medicine

## 2024-01-16 DIAGNOSIS — Z6831 Body mass index (BMI) 31.0-31.9, adult: Secondary | ICD-10-CM | POA: Diagnosis not present

## 2024-01-16 DIAGNOSIS — Z887 Allergy status to serum and vaccine status: Secondary | ICD-10-CM

## 2024-01-16 DIAGNOSIS — K8050 Calculus of bile duct without cholangitis or cholecystitis without obstruction: Principal | ICD-10-CM

## 2024-01-16 DIAGNOSIS — H409 Unspecified glaucoma: Secondary | ICD-10-CM | POA: Diagnosis not present

## 2024-01-16 DIAGNOSIS — Z79899 Other long term (current) drug therapy: Secondary | ICD-10-CM | POA: Diagnosis not present

## 2024-01-16 DIAGNOSIS — Z83511 Family history of glaucoma: Secondary | ICD-10-CM | POA: Diagnosis not present

## 2024-01-16 DIAGNOSIS — K8000 Calculus of gallbladder with acute cholecystitis without obstruction: Principal | ICD-10-CM | POA: Diagnosis present

## 2024-01-16 DIAGNOSIS — Z713 Dietary counseling and surveillance: Secondary | ICD-10-CM | POA: Diagnosis not present

## 2024-01-16 DIAGNOSIS — K8020 Calculus of gallbladder without cholecystitis without obstruction: Secondary | ICD-10-CM

## 2024-01-16 DIAGNOSIS — E1169 Type 2 diabetes mellitus with other specified complication: Secondary | ICD-10-CM | POA: Diagnosis present

## 2024-01-16 DIAGNOSIS — I959 Hypotension, unspecified: Secondary | ICD-10-CM | POA: Diagnosis not present

## 2024-01-16 DIAGNOSIS — Z7901 Long term (current) use of anticoagulants: Secondary | ICD-10-CM | POA: Diagnosis not present

## 2024-01-16 DIAGNOSIS — E782 Mixed hyperlipidemia: Secondary | ICD-10-CM | POA: Diagnosis present

## 2024-01-16 DIAGNOSIS — Z8249 Family history of ischemic heart disease and other diseases of the circulatory system: Secondary | ICD-10-CM

## 2024-01-16 DIAGNOSIS — Z833 Family history of diabetes mellitus: Secondary | ICD-10-CM

## 2024-01-16 DIAGNOSIS — Z794 Long term (current) use of insulin: Secondary | ICD-10-CM

## 2024-01-16 DIAGNOSIS — I10 Essential (primary) hypertension: Secondary | ICD-10-CM | POA: Diagnosis present

## 2024-01-16 DIAGNOSIS — K5730 Diverticulosis of large intestine without perforation or abscess without bleeding: Secondary | ICD-10-CM | POA: Diagnosis not present

## 2024-01-16 DIAGNOSIS — R079 Chest pain, unspecified: Secondary | ICD-10-CM | POA: Diagnosis not present

## 2024-01-16 DIAGNOSIS — E041 Nontoxic single thyroid nodule: Secondary | ICD-10-CM | POA: Diagnosis not present

## 2024-01-16 DIAGNOSIS — E66811 Obesity, class 1: Secondary | ICD-10-CM | POA: Diagnosis not present

## 2024-01-16 DIAGNOSIS — E876 Hypokalemia: Secondary | ICD-10-CM | POA: Diagnosis present

## 2024-01-16 DIAGNOSIS — E119 Type 2 diabetes mellitus without complications: Secondary | ICD-10-CM

## 2024-01-16 DIAGNOSIS — Z7984 Long term (current) use of oral hypoglycemic drugs: Secondary | ICD-10-CM

## 2024-01-16 DIAGNOSIS — Z91048 Other nonmedicinal substance allergy status: Secondary | ICD-10-CM | POA: Diagnosis not present

## 2024-01-16 DIAGNOSIS — R0789 Other chest pain: Secondary | ICD-10-CM | POA: Diagnosis not present

## 2024-01-16 DIAGNOSIS — I480 Paroxysmal atrial fibrillation: Secondary | ICD-10-CM | POA: Diagnosis not present

## 2024-01-16 DIAGNOSIS — R42 Dizziness and giddiness: Secondary | ICD-10-CM | POA: Diagnosis not present

## 2024-01-16 DIAGNOSIS — K8070 Calculus of gallbladder and bile duct without cholecystitis without obstruction: Secondary | ICD-10-CM | POA: Diagnosis not present

## 2024-01-16 DIAGNOSIS — I4891 Unspecified atrial fibrillation: Secondary | ICD-10-CM | POA: Diagnosis not present

## 2024-01-16 LAB — BASIC METABOLIC PANEL WITH GFR
Anion gap: 12 (ref 5–15)
BUN: 22 mg/dL (ref 8–23)
CO2: 24 mmol/L (ref 22–32)
Calcium: 9.3 mg/dL (ref 8.9–10.3)
Chloride: 100 mmol/L (ref 98–111)
Creatinine, Ser: 0.96 mg/dL (ref 0.44–1.00)
GFR, Estimated: >60 mL/min (ref >60)
Glucose, Bld: 162 mg/dL — ABNORMAL HIGH (ref 70–99)
Potassium: 4 mmol/L (ref 3.5–5.1)
Sodium: 136 mmol/L (ref 135–145)

## 2024-01-16 LAB — CBC
HCT: 41.6 % (ref 36.0–46.0)
Hemoglobin: 13.8 g/dL (ref 12.0–15.0)
MCH: 30.3 pg (ref 26.0–34.0)
MCHC: 33.2 g/dL (ref 30.0–36.0)
MCV: 91.4 fL (ref 80.0–100.0)
Platelets: 200 K/uL (ref 150–400)
RBC: 4.55 MIL/uL (ref 3.87–5.11)
RDW: 12.8 % (ref 11.5–15.5)
WBC: 7.4 K/uL (ref 4.0–10.5)
nRBC: 0 % (ref 0.0–0.2)

## 2024-01-16 LAB — TROPONIN I (HIGH SENSITIVITY): Troponin I (High Sensitivity): 5 ng/L (ref <18)

## 2024-01-16 NOTE — ED Triage Notes (Signed)
 Pt reports centralized chest pain that began tonight around 2000, pt reports hx afib. Pt is on blood thinner and metoprolol . Pt also reports some dizziness.

## 2024-01-16 NOTE — ED Provider Notes (Signed)
 [facility] Provider Note    Event Date/Time   First MD Initiated Contact with Patient 01/16/24 2322     (approximate)   History   Chest Pain   HPI  April Solis is a 68 y.o. female   Past medical history of hypertension hyperlipidemia, paroxysmal atrial fibrillation on Eliquis , here with substernal chest pain with radiation to the back.  Started last night.  No shortness of breath.  No respiratory infectious symptoms.  No other acute medical complaints.  Independent Historian contributed to assessment above: Family at bedside corroborates information above  External Medical Documents Reviewed: Prior outpatient notes      Physical Exam   Triage Vital Signs: ED Triage Vitals  Encounter Vitals Group     BP 01/16/24 2145 (!) 147/69     Girls Systolic BP Percentile --      Girls Diastolic BP Percentile --      Boys Systolic BP Percentile --      Boys Diastolic BP Percentile --      Pulse Rate 01/16/24 2145 (!) 55     Resp 01/16/24 2145 18     Temp 01/16/24 2145 98.4 F (36.9 C)     Temp Source 01/16/24 2145 Oral     SpO2 01/16/24 2145 98 %     Weight 01/16/24 2145 165 lb (74.8 kg)     Height 01/16/24 2145 5' 1 (1.549 m)     Head Circumference --      Peak Flow --      Pain Score 01/16/24 2144 8     Pain Loc --      Pain Education --      Exclude from Growth Chart --     Most recent vital signs: Vitals:   01/17/24 0141 01/17/24 0500  BP: (!) 144/69 136/67  Pulse: 75 (!) 57  Resp: 18   Temp: 98.2 F (36.8 C)   SpO2: 99%     General: Awake, no distress.  CV:  Good peripheral perfusion.  Resp:  Normal effort.  Abd:  No distention.  Other:  Uncomfortable appearing, pacing the room.  Clutching her chest.  No focal tenderness to palpation of the chest wall, but does have epigastric/right upper quadrant tenderness to palpation.  Nonperitoneal exam.  Radial pulses intact and equal bilaterally.  No fever.   ED Results /  Procedures / Treatments   Labs (all labs ordered are listed, but only abnormal results are displayed) Labs Reviewed  BASIC METABOLIC PANEL WITH GFR - Abnormal; Notable for the following components:      Result Value   Glucose, Bld 162 (*)    All other components within normal limits  CBC  HEPATIC FUNCTION PANEL  LIPASE, BLOOD  HIV ANTIBODY (ROUTINE TESTING W REFLEX)  TROPONIN I (HIGH SENSITIVITY)  TROPONIN I (HIGH SENSITIVITY)     I ordered and reviewed the above labs they are notable for cell counts electrolytes LFTs unremarkable.  Serial troponins have been flat.  EKG  ED ECG REPORT I, Ginnie Shams, the attending physician, personally viewed and interpreted this ECG.   Date: 01/16/2024  EKG Time: 2148  Rate: 59  Rhythm: sinus  Axis: nl  Intervals:nl  ST&T Change:no stemi    RADIOLOGY I independently reviewed and interpreted chest x-ray and see no obvious focality pneumothorax I also reviewed radiologist's formal read.   PROCEDURES:  Critical Care performed: No  Procedures   MEDICATIONS ORDERED IN ED: Medications  amLODipine  (NORVASC )  tablet 5 mg (has no administration in time range)  lisinopril  (ZESTRIL ) tablet 10 mg (has no administration in time range)  metoprolol  tartrate (LOPRESSOR ) tablet 25 mg (has no administration in time range)  rosuvastatin  (CRESTOR ) tablet 10 mg (has no administration in time range)  acetaminophen (TYLENOL) tablet 650 mg (has no administration in time range)    Or  acetaminophen (TYLENOL) suppository 650 mg (has no administration in time range)  ondansetron (ZOFRAN) tablet 4 mg (has no administration in time range)    Or  ondansetron (ZOFRAN) injection 4 mg (has no administration in time range)  insulin aspart (novoLOG) injection 0-15 Units (has no administration in time range)  insulin aspart (novoLOG) injection 0-5 Units (has no administration in time range)  oxyCODONE (Oxy IR/ROXICODONE) immediate release tablet 5 mg (has no  administration in time range)  HYDROmorphone (DILAUDID) injection 1 mg (1 mg Intravenous Given 01/17/24 0500)  promethazine (PHENERGAN) 12.5 mg in sodium chloride  0.9 % 50 mL IVPB (has no administration in time range)  morphine (PF) 2 MG/ML injection 2 mg (2 mg Intravenous Given 01/17/24 0052)  ondansetron (ZOFRAN) injection 4 mg (4 mg Intravenous Given 01/17/24 0051)  morphine (PF) 2 MG/ML injection 2 mg (2 mg Intravenous Given 01/17/24 0223)  iohexol  (OMNIPAQUE ) 350 MG/ML injection 100 mL (100 mLs Intravenous Contrast Given 01/17/24 0120)    External physician / consultants:  I spoke with hospital medicine for admission, Dr. Sheppard Endow of general surgery regarding care plan for this patient.   IMPRESSION / MDM / ASSESSMENT AND PLAN / ED COURSE  I reviewed the triage vital signs and the nursing notes.                                Patient's presentation is most consistent with acute presentation with potential threat to life or bodily function.  Differential diagnosis includes, but is not limited to, ACS, PE, dissection, GERD/gastritis/ulcer, pancreatitis, biliary colic   The patient is on the cardiac monitor to evaluate for evidence of arrhythmia and/or significant heart rate changes.  MDM:    Pain was quite severe in the chest rating to the back acute onset concerning for ACS or dissection.  Fortunately troponins flat EKG nonischemic and the CTA dissection protocol did not show dissection but did instead show gallstones which I think accounts for her pain.  She is tender in the right upper quadrant.  Her nausea and pain has been refractory to medications.    No signs of infection fortunately.    She did take Eliquis  last night show she is not a surgical candidate anytime soon.    I talked with Dr. Sheppard Endow to let him know about the biliary colic admission, advised admission to hospital service.      FINAL CLINICAL IMPRESSION(S) / ED DIAGNOSES   Final diagnoses:   Biliary colic  Calculus of gallbladder without cholecystitis without obstruction  Nonspecific chest pain     Rx / DC Orders   ED Discharge Orders     None        Note:  This document was prepared using Dragon voice recognition software and may include unintentional dictation errors.    Cyrena Mylar, MD 01/17/24 [phone]

## 2024-01-17 ENCOUNTER — Emergency Department

## 2024-01-17 DIAGNOSIS — E782 Mixed hyperlipidemia: Secondary | ICD-10-CM | POA: Diagnosis present

## 2024-01-17 DIAGNOSIS — E1169 Type 2 diabetes mellitus with other specified complication: Secondary | ICD-10-CM | POA: Diagnosis present

## 2024-01-17 DIAGNOSIS — I4891 Unspecified atrial fibrillation: Secondary | ICD-10-CM | POA: Diagnosis not present

## 2024-01-17 DIAGNOSIS — Z7984 Long term (current) use of oral hypoglycemic drugs: Secondary | ICD-10-CM | POA: Diagnosis not present

## 2024-01-17 DIAGNOSIS — Z7901 Long term (current) use of anticoagulants: Secondary | ICD-10-CM | POA: Diagnosis not present

## 2024-01-17 DIAGNOSIS — Z83511 Family history of glaucoma: Secondary | ICD-10-CM | POA: Diagnosis not present

## 2024-01-17 DIAGNOSIS — E876 Hypokalemia: Secondary | ICD-10-CM | POA: Diagnosis present

## 2024-01-17 DIAGNOSIS — Z887 Allergy status to serum and vaccine status: Secondary | ICD-10-CM | POA: Diagnosis not present

## 2024-01-17 DIAGNOSIS — Z91048 Other nonmedicinal substance allergy status: Secondary | ICD-10-CM | POA: Diagnosis not present

## 2024-01-17 DIAGNOSIS — K8000 Calculus of gallbladder with acute cholecystitis without obstruction: Secondary | ICD-10-CM | POA: Diagnosis present

## 2024-01-17 DIAGNOSIS — E66811 Obesity, class 1: Secondary | ICD-10-CM | POA: Diagnosis present

## 2024-01-17 DIAGNOSIS — R079 Chest pain, unspecified: Secondary | ICD-10-CM | POA: Diagnosis not present

## 2024-01-17 DIAGNOSIS — Z8249 Family history of ischemic heart disease and other diseases of the circulatory system: Secondary | ICD-10-CM | POA: Diagnosis not present

## 2024-01-17 DIAGNOSIS — I480 Paroxysmal atrial fibrillation: Secondary | ICD-10-CM | POA: Diagnosis not present

## 2024-01-17 DIAGNOSIS — H409 Unspecified glaucoma: Secondary | ICD-10-CM | POA: Diagnosis present

## 2024-01-17 DIAGNOSIS — Z6831 Body mass index (BMI) 31.0-31.9, adult: Secondary | ICD-10-CM | POA: Diagnosis not present

## 2024-01-17 DIAGNOSIS — K8050 Calculus of bile duct without cholangitis or cholecystitis without obstruction: Secondary | ICD-10-CM | POA: Diagnosis present

## 2024-01-17 DIAGNOSIS — E1165 Type 2 diabetes mellitus with hyperglycemia: Secondary | ICD-10-CM | POA: Diagnosis not present

## 2024-01-17 DIAGNOSIS — Z713 Dietary counseling and surveillance: Secondary | ICD-10-CM | POA: Diagnosis not present

## 2024-01-17 DIAGNOSIS — I10 Essential (primary) hypertension: Secondary | ICD-10-CM | POA: Diagnosis present

## 2024-01-17 DIAGNOSIS — K8020 Calculus of gallbladder without cholecystitis without obstruction: Secondary | ICD-10-CM | POA: Diagnosis not present

## 2024-01-17 DIAGNOSIS — Z833 Family history of diabetes mellitus: Secondary | ICD-10-CM | POA: Diagnosis not present

## 2024-01-17 DIAGNOSIS — I959 Hypotension, unspecified: Secondary | ICD-10-CM | POA: Diagnosis not present

## 2024-01-17 DIAGNOSIS — Z794 Long term (current) use of insulin: Secondary | ICD-10-CM | POA: Diagnosis not present

## 2024-01-17 DIAGNOSIS — Z79899 Other long term (current) drug therapy: Secondary | ICD-10-CM | POA: Diagnosis not present

## 2024-01-17 DIAGNOSIS — K810 Acute cholecystitis: Secondary | ICD-10-CM | POA: Diagnosis not present

## 2024-01-17 LAB — HEPATIC FUNCTION PANEL
ALT: 25 U/L (ref 0–44)
AST: 25 U/L (ref 15–41)
Albumin: 4.4 g/dL (ref 3.5–5.0)
Alkaline Phosphatase: 52 U/L (ref 38–126)
Bilirubin, Direct: <0.1 mg/dL (ref 0.0–0.2)
Total Bilirubin: 0.4 mg/dL (ref 0.0–1.2)
Total Protein: 7.4 g/dL (ref 6.5–8.1)

## 2024-01-17 LAB — GLUCOSE, CAPILLARY
Glucose-Capillary: 157 mg/dL — ABNORMAL HIGH (ref 70–99)
Glucose-Capillary: 179 mg/dL — ABNORMAL HIGH (ref 70–99)

## 2024-01-17 LAB — CBG MONITORING, ED
Glucose-Capillary: 147 mg/dL — ABNORMAL HIGH (ref 70–99)
Glucose-Capillary: 129 mg/dL — ABNORMAL HIGH (ref 70–99)

## 2024-01-17 LAB — LIPASE, BLOOD: Lipase: 45 U/L (ref 11–51)

## 2024-01-17 LAB — HIV ANTIBODY (ROUTINE TESTING W REFLEX): HIV Screen 4th Generation wRfx: NONREACTIVE

## 2024-01-17 LAB — TROPONIN I (HIGH SENSITIVITY): Troponin I (High Sensitivity): 4 ng/L (ref <18)

## 2024-01-17 MED ORDER — OXYCODONE HCL 5 MG PO TABS
5.0000 mg | ORAL_TABLET | ORAL | Status: AC | PRN
  Administered 2024-01-17 – 2024-01-18 (×2): 5 mg via ORAL
  Filled 2024-01-17 (×2): qty 1

## 2024-01-17 MED ORDER — LISINOPRIL 10 MG PO TABS
10.0000 mg | ORAL_TABLET | Freq: Every day | ORAL | Status: DC
Start: 2024-01-17 — End: 2024-01-18
  Administered 2024-01-17: 10 mg via ORAL
  Filled 2024-01-17: qty 1

## 2024-01-17 MED ORDER — ONDANSETRON HCL 4 MG/2ML IJ SOLN
4.0000 mg | Freq: Once | INTRAMUSCULAR | Status: AC
Start: 2024-01-17 — End: 2024-01-17
  Administered 2024-01-17: 4 mg via INTRAVENOUS
  Filled 2024-01-17: qty 2

## 2024-01-17 MED ORDER — PIPERACILLIN-TAZOBACTAM 3.375 G IVPB
3.3750 g | Freq: Three times a day (TID) | INTRAVENOUS | Status: DC
  Administered 2024-01-17 – 2024-01-18 (×3): 3.375 g via INTRAVENOUS
  Filled 2024-01-17 (×3): qty 50

## 2024-01-17 MED ORDER — ONDANSETRON HCL 4 MG/2ML IJ SOLN
4.0000 mg | Freq: Four times a day (QID) | INTRAMUSCULAR | Status: DC | PRN
  Filled 2024-01-17: qty 2

## 2024-01-17 MED ORDER — METOPROLOL TARTRATE 25 MG PO TABS
25.0000 mg | ORAL_TABLET | Freq: Two times a day (BID) | ORAL | Status: DC
Start: 2024-01-17 — End: 2024-01-19
  Administered 2024-01-17 – 2024-01-19 (×4): 25 mg via ORAL
  Filled 2024-01-17 (×4): qty 1

## 2024-01-17 MED ORDER — LACTATED RINGERS IV SOLN: INTRAVENOUS | Status: AC

## 2024-01-17 MED ORDER — ROSUVASTATIN CALCIUM 10 MG PO TABS
10.0000 mg | ORAL_TABLET | Freq: Every day | ORAL | Status: DC
  Administered 2024-01-17 – 2024-01-24 (×7): 10 mg via ORAL
  Filled 2024-01-17 (×8): qty 1

## 2024-01-17 MED ORDER — MORPHINE SULFATE (PF) 2 MG/ML IV SOLN
2.0000 mg | Freq: Once | INTRAVENOUS | Status: AC
  Administered 2024-01-17: 2 mg via INTRAVENOUS
  Filled 2024-01-17: qty 1

## 2024-01-17 MED ORDER — ACETAMINOPHEN 650 MG RE SUPP
650.0000 mg | Freq: Four times a day (QID) | RECTAL | Status: DC | PRN
Start: 2024-01-17 — End: 2024-01-24

## 2024-01-17 MED ORDER — AMLODIPINE BESYLATE 5 MG PO TABS
5.0000 mg | ORAL_TABLET | Freq: Every day | ORAL | Status: DC
  Administered 2024-01-17: 5 mg via ORAL
  Filled 2024-01-17: qty 1

## 2024-01-17 MED ORDER — IOHEXOL 350 MG/ML SOLN
100.0000 mL | Freq: Once | INTRAVENOUS | Status: AC | PRN
  Administered 2024-01-17: 100 mL via INTRAVENOUS

## 2024-01-17 MED ORDER — MORPHINE SULFATE (PF) 2 MG/ML IV SOLN
2.0000 mg | Freq: Once | INTRAVENOUS | Status: AC | PRN
  Administered 2024-01-17: 2 mg via INTRAVENOUS
  Filled 2024-01-17: qty 1

## 2024-01-17 MED ORDER — INSULIN ASPART 100 UNIT/ML IJ SOLN
0.0000 [IU] | Freq: Three times a day (TID) | INTRAMUSCULAR | Status: DC
  Administered 2024-01-17 – 2024-01-19 (×4): 3 [IU] via SUBCUTANEOUS
  Administered 2024-01-19: 2 [IU] via SUBCUTANEOUS
  Administered 2024-01-20 – 2024-01-21 (×2): 3 [IU] via SUBCUTANEOUS
  Administered 2024-01-21: 2 [IU] via SUBCUTANEOUS
  Administered 2024-01-21: 5 [IU] via SUBCUTANEOUS
  Administered 2024-01-22: 3 [IU] via SUBCUTANEOUS
  Administered 2024-01-22: 2 [IU] via SUBCUTANEOUS
  Administered 2024-01-22: 5 [IU] via SUBCUTANEOUS
  Administered 2024-01-23 (×3): 3 [IU] via SUBCUTANEOUS
  Administered 2024-01-24: 2 [IU] via SUBCUTANEOUS
  Filled 2024-01-17 (×17): qty 1

## 2024-01-17 MED ORDER — ONDANSETRON HCL 4 MG PO TABS: 4.0000 mg | ORAL_TABLET | Freq: Four times a day (QID) | ORAL | Status: DC | PRN

## 2024-01-17 MED ORDER — HYDROMORPHONE HCL 1 MG/ML IJ SOLN
1.0000 mg | INTRAMUSCULAR | Status: DC | PRN
  Administered 2024-01-17 (×2): 1 mg via INTRAVENOUS
  Filled 2024-01-17 (×2): qty 1

## 2024-01-17 MED ORDER — HYDROMORPHONE HCL 1 MG/ML IJ SOLN
1.0000 mg | INTRAMUSCULAR | Status: AC | PRN
  Administered 2024-01-17 – 2024-01-18 (×2): 1 mg via INTRAVENOUS
  Filled 2024-01-17 (×2): qty 1

## 2024-01-17 MED ORDER — OXYCODONE HCL 5 MG PO TABS
5.0000 mg | ORAL_TABLET | ORAL | Status: DC | PRN
  Administered 2024-01-17: 5 mg via ORAL
  Filled 2024-01-17: qty 1

## 2024-01-17 MED ORDER — HYDRALAZINE HCL 20 MG/ML IJ SOLN: 5.0000 mg | Freq: Four times a day (QID) | INTRAMUSCULAR | Status: AC | PRN

## 2024-01-17 MED ORDER — INSULIN ASPART 100 UNIT/ML IJ SOLN
0.0000 [IU] | Freq: Every day | INTRAMUSCULAR | Status: DC
  Administered 2024-01-18: 3 [IU] via SUBCUTANEOUS
  Administered 2024-01-21: 2 [IU] via SUBCUTANEOUS
  Filled 2024-01-17 (×3): qty 1

## 2024-01-17 MED ORDER — ACETAMINOPHEN 325 MG PO TABS
650.0000 mg | ORAL_TABLET | Freq: Four times a day (QID) | ORAL | Status: DC | PRN
  Administered 2024-01-20 – 2024-01-22 (×4): 650 mg via ORAL
  Filled 2024-01-17 (×4): qty 2

## 2024-01-17 MED ORDER — SODIUM CHLORIDE 0.9 % IV SOLN: 12.5000 mg | Freq: Four times a day (QID) | INTRAVENOUS | Status: DC | PRN

## 2024-01-17 NOTE — Assessment & Plan Note (Addendum)
 With suspected cholecystitis CT showing 3 cm gallstone in gallbladder without evidence of acute cholecystitis Pain control, antiemetics Hold Eliquis  Surgery aware and formal consult placed  01/17/2024: Zosyn IV antibiotic per pharmacy initiated.  LR 100 mL/h, 1 day ordered

## 2024-01-17 NOTE — Plan of Care (Signed)

## 2024-01-17 NOTE — Assessment & Plan Note (Addendum)
 Home metformin  500 mg daily, glipizide  2.5 mg daily will not be resumed on admission Insulin SSI with at bedtime coverage

## 2024-01-17 NOTE — Assessment & Plan Note (Addendum)
 Holding Eliquis  in anticipation of surgery Patient took Eliquis  yesterday prior to ED presentation. Home metoprolol  25 mg p.o. twice daily resumed

## 2024-01-17 NOTE — Hospital Course (Addendum)
 Ms. April Solis is a 68 year old female with history of hypertension, hyperlipidemia, paroxysmal atrial fibrillation on Eliquis , non-insulin-dependent diabetes mellitus, who presents ED for chief concerns of epigastric pain and centralized chest pain and dizziness. CT scan showed gallstone, patient has biliary colic. Patient was started started on Zosyn, cholecystectomy was performed on 10/15, gallbladder appeared inflated. Postoperatively, patient converted to atrial fibrillation with RVR.  Started on diltiazem. Atrial fibrillation did not convert on 10/16, switched to amiodarone and heparin, cardiology consulted.  Decision was made for rate control only, Eliquis  was restarted on 10/18 after discussion with general surgery. Also followed by cardiology/EP, medication adjusted, heart rate better controlled, decision is made not to cardiovert, EP has cleared patient for discharge.

## 2024-01-17 NOTE — Assessment & Plan Note (Signed)
Rosuvastatin 10 mg daily

## 2024-01-17 NOTE — Consult Note (Signed)
 [facility] SURGICAL CONSULTATION NOTE (initial) - cpt: 00756   HISTORY OF PRESENT ILLNESS (HPI):  68 y.o. female presented to [facility] ED last night for evaluation of abdominal pain. She reports the acute onset of epigastric abdominal around 8 PM last night. This onset suddenly. She reports associated nausea and emesis. No fever, chills, CP, SOB, urinary changes, or bowel changes. No history of similar in the past. No reports of post-prandial pain. Only previous abdominal surgeries positive for laparoscopic tubal ligation. Of note, she has a history of atrial fibrillation on Eliquis , which she last took on 10/13 at 9PM. Work up in the ED revealed a normal WBC to 7.4K, Hgb to 13.8, sCr - 0.96, LFTs normal, Bilirubin normal at 0.4. She did get CT which was concerning for 3 cm gallstone in gallbladder neck. She was admitted to the medicine service.   Surgery is consulted by emergency medicine physician Dr. Ginnie Shams, DO in this context for evaluation and management of symptomatic cholelithiasis.  PAST MEDICAL HISTORY (PMH):  Past Medical History:  Diagnosis Date   Achilles rupture, right    Contact dermatitis and eczema due to plant    Diabetes mellitus without complication (HCC)    Environmental allergies    Glaucoma    History of chicken pox    Hyperlipidemia    Hypertension    Osteopenia    Paroxysmal A-fib (HCC)      PAST SURGICAL HISTORY (PSH):  Past Surgical History:  Procedure Laterality Date   ACHILLES TENDON REPAIR Right    BREAST BIOPSY Right 1999   neg   COLONOSCOPY WITH PROPOFOL  N/A 12/12/2017   Procedure: COLONOSCOPY WITH PROPOFOL ;  Surgeon: Viktoria Lamar DASEN, MD;  Location: [facility] ENDOSCOPY;  Service: Endoscopy;  Laterality: N/A;   COLONOSCOPY WITH PROPOFOL  N/A 06/24/2023   Procedure: COLONOSCOPY WITH PROPOFOL ;  Surgeon: Onita Elspeth Sharper, DO;  Location: [facility] ENDOSCOPY;  Service: Gastroenterology;  Laterality: N/A;  DM, 1ST CASE, PLEASE   TUBAL LIGATION        MEDICATIONS:  Prior to Admission medications   Medication Sig Start Date End Date Taking? Authorizing Provider  amLODipine  (NORVASC ) 5 MG tablet Take 1 tablet (5 mg total) by mouth daily. 07/19/23   Glendia Shad, MD  apixaban  (ELIQUIS ) 5 MG TABS tablet Take 1 tablet (5 mg total) by mouth 2 (two) times daily. 07/19/23   Glendia Shad, MD  dorzolamide-timolol (COSOPT) 22.3-6.8 MG/ML ophthalmic solution Place 1 drop into both eyes 2 (two) times daily. 07/05/14   [provider]  doxycycline  (ADOXA) 50 MG tablet Take 1-2 tabs PO QD PRN flares 04/11/23   Jackquline Sawyer, MD  empagliflozin  (JARDIANCE ) 25 MG TABS tablet Take 1 tablet (25 mg total) by mouth daily. 07/19/23   Glendia Shad, MD  finasteride  (PROSCAR ) 5 MG tablet Take 1 tablet (5 mg total) by mouth daily. 08/02/23   Jackquline Sawyer, MD  Fluocinolone  Acetonide Scalp 0.01 % OIL Apply to aa's scalp QD PRN flares. 10/13/21   Jackquline Sawyer, MD  glipiZIDE  (GLUCOTROL  XL) 2.5 MG 24 hr tablet Take 1 tablet (2.5 mg total) by mouth daily with breakfast. 07/19/23   Glendia Shad, MD  glucose blood (GE100 BLOOD GLUCOSE TEST) test strip TEST TWICE DAILY 07/19/23   Glendia Shad, MD  ketoconazole  (NIZORAL ) 2 % shampoo Shampoo into the scalp let sit several minutes then wash out. Use 2-3 days per week. 10/13/21   Jackquline Sawyer, MD  lisinopril  (ZESTRIL ) 10 MG tablet Take 1 tablet (10 mg total) by  mouth daily. 07/19/23   Glendia Shad, MD  metFORMIN  (GLUCOPHAGE -XR) 500 MG 24 hr tablet TAKE TWO TABLETS BY MOUTH EVERY MORNING AND AT BEDTIME 10/14/23   Glendia Shad, MD  metoprolol  tartrate (LOPRESSOR ) 25 MG tablet Take 1 tablet (25 mg total) by mouth 2 (two) times daily. 07/19/23   Glendia Shad, MD  minoxidil  (LONITEN ) 2.5 MG tablet Take 1 tablet (2.5 mg total) by mouth daily. 08/02/23   Jackquline Sawyer, MD  Multiple Vitamin (MULTIVITAMIN) tablet Take 1 tablet by mouth daily.    [provider]  mupirocin  ointment (BACTROBAN ) 2 % Apply 1  Application topically 2 (two) times daily. Bid to right forehead 08/08/23   Jackquline Sawyer, MD  nystatin  cream (MYCOSTATIN ) Apply 1 Application topically 2 (two) times daily. 01/07/23   Glendia Shad, MD  rosuvastatin  (CRESTOR ) 10 MG tablet TAKE ONE TABLET EVERY DAY 10/14/23   Glendia Shad, MD  timolol (BETIMOL) 0.25 % ophthalmic solution Place 1-2 drops into both eyes daily.    [provider]  triamcinolone  cream (KENALOG ) 0.1 % APPLY TO THE AFFECTED AREA DAILY AS NEEDED 01/21/20   Glendia Shad, MD  vitamin E 200 UNIT capsule Take 200 Units by mouth daily.    [provider]     ALLERGIES:  Allergies  Allergen Reactions   Influenza Vaccines Other (See Comments)    Flu symptoms two hours after injections   Rubbing Alcohol [Alcohol]    Other Rash    Poison ivy     SOCIAL HISTORY:  Social History   Socioeconomic History   Marital status: Married    Spouse name: Not on file   Number of children: 3   Years of education: Not on file   Highest education level: Some college, no degree  Occupational History    Employer: [employer]  Tobacco Use   Smoking status: Never   Smokeless tobacco: Never  Vaping Use   Vaping status: Never Used  Substance and Sexual Activity   Alcohol use: No    Alcohol/week: 0.0 standard drinks of alcohol   Drug use: No   Sexual activity: Not on file  Other Topics Concern   Not on file  Social History Narrative   Married   Social Drivers of Health   Financial Resource Strain: Low Risk  (11/21/2023)   Overall Financial Resource Strain (CARDIA)    Difficulty of Paying Living Expenses: Not hard at all  Food Insecurity: No Food Insecurity (11/21/2023)   Hunger Vital Sign    Worried About Running Out of Food in the Last Year: Never true    Ran Out of Food in the Last Year: Never true  Transportation Needs: Unknown (11/21/2023)   PRAPARE - Administrator, Civil Service (Medical): Not on file    Lack of Transportation  (Non-Medical): No  Physical Activity: Insufficiently Active (04/11/2023)   Exercise Vital Sign    Days of Exercise per Week: 4 days    Minutes of Exercise per Session: 30 min  Stress: No Stress Concern Present (11/21/2023)   [employer] of Occupational Health - Occupational Stress Questionnaire    Feeling of Stress: Not at all  Social Connections: Socially Integrated (11/21/2023)   Social Connection and Isolation Panel    Frequency of Communication with Friends and Family: More than three times a week    Frequency of Social Gatherings with Friends and Family: More than three times a week    Attends Religious Services: More than 4 times per year  Active Member of Clubs or Organizations: Yes    Attends Banker Meetings: More than 4 times per year    Marital Status: Married  Catering manager Violence: Not At Risk (02/22/2023)   Humiliation, Afraid, Rape, and Kick questionnaire    Fear of Current or Ex-Partner: No    Emotionally Abused: No    Physically Abused: No    Sexually Abused: No     FAMILY HISTORY:  Family History  Problem Relation Age of Onset   Colon polyps Mother    Alzheimer's disease Mother    Heart attack Father 3   Hypertension Father    Diabetes Father    Hypertension Sister    Diabetes Sister        x2   COPD Sister    Diabetes Sister    Hypertension Sister    Glaucoma Sister    Arrhythmia Son        Faster heart beat than normal   Breast cancer Neg Hx    Colon cancer Neg Hx       REVIEW OF SYSTEMS:  Review of Systems  Constitutional:  Negative for chills and fever.  Respiratory:  Negative for cough and shortness of breath.   Cardiovascular:  Negative for chest pain and palpitations.  Gastrointestinal:  Positive for abdominal pain, nausea and vomiting. Negative for blood in stool, constipation and diarrhea.  Genitourinary:  Negative for dysuria and urgency.  All other systems reviewed and are negative.   VITAL SIGNS:  Temp:   [98.2 F (36.8 C)-98.6 F (37 C)] 98.6 F (37 C) (10/14 0757) Pulse Rate:  [55-75] 75 (10/14 0932) Resp:  [18] 18 (10/14 0757) BP: (125-147)/(67-82) 132/76 (10/14 0932) SpO2:  [98 %-99 %] 99 % (10/14 0757) Weight:  [74.8 kg] 74.8 kg (10/13 2145)     Height: 5' 1 (154.9 cm) Weight: 74.8 kg BMI (Calculated): 31.19   INTAKE/OUTPUT:  No intake/output data recorded.  PHYSICAL EXAM:  Physical Exam Vitals and nursing note reviewed. Exam conducted with a chaperone present.  Constitutional:      General: She is not in acute distress.    Appearance: Normal appearance. She is obese. She is not ill-appearing.     Comments: Resting in bed; NAD  HENT:     Head: Normocephalic and atraumatic.  Eyes:     General: No scleral icterus.    Conjunctiva/sclera: Conjunctivae normal.  Cardiovascular:     Rate and Rhythm: Normal rate.     Pulses: Normal pulses.     Heart sounds: No murmur heard. Pulmonary:     Effort: Pulmonary effort is normal. No respiratory distress.  Abdominal:     General: Abdomen is flat. A surgical scar is present. There is no distension.     Palpations: Abdomen is soft.     Tenderness: There is abdominal tenderness in the right upper quadrant and epigastric area. There is no guarding or rebound. Positive signs include Murphy's sign.  Genitourinary:    Comments: Deferred Skin:    General: Skin is warm and dry.     Coloration: Skin is not jaundiced.  Neurological:     General: No focal deficit present.     Mental Status: She is alert and oriented to person, place, and time. Mental status is at baseline.  Psychiatric:        Mood and Affect: Mood normal.        Behavior: Behavior normal.      Labs:     Latest  Ref Rng & Units 01/16/2024    9:46 PM 01/04/2023    7:19 AM 12/01/2021    7:33 AM  CBC  WBC 4.0 - 10.5 K/uL 7.4  7.0  5.3   Hemoglobin 12.0 - 15.0 g/dL 86.1  85.3  86.1   Hematocrit 36.0 - 46.0 % 41.6  44.1  41.0   Platelets 150 - 400 K/uL 200  220.0  193.0        Latest Ref Rng & Units 01/17/2024   12:00 AM 01/16/2024    9:46 PM 11/18/2023    7:46 AM  CMP  Glucose 70 - 99 mg/dL  837  848   BUN 8 - 23 mg/dL  22  16   Creatinine 9.55 - 1.00 mg/dL  9.03  9.24   Sodium 864 - 145 mmol/L  136  140   Potassium 3.5 - 5.1 mmol/L  4.0  4.5   Chloride 98 - 111 mmol/L  100  104   CO2 22 - 32 mmol/L  24  26   Calcium  8.9 - 10.3 mg/dL  9.3  9.0   Total Protein 6.5 - 8.1 g/dL 7.4   6.6   Total Bilirubin 0.0 - 1.2 mg/dL 0.4   0.4   Alkaline Phos 38 - 126 U/L 52   57   AST 15 - 41 U/L 25   20   ALT 0 - 44 U/L 25   24      Imaging studies:   CT Abdomen/Pelvis (01/17/2024) personally reviewed with large gallstone in gallbladder neck, andr radiologist report reviewed below:  IMPRESSION: No evidence of aortic aneurysm or dissection. No evidence of pulmonary embolus.   No acute findings in the chest, abdomen or pelvis.   Cholelithiasis.   Scattered colonic diverticulosis.   Assessment/Plan: (ICD-10's: K81.0) 68 y.o. female with biliary colic.   - Appreciate medicine admission - Given her recent Eliquis  use, we will plan for interval cholecystectomy to allow for this to washout. Anticipate this on Thursday (10/16) with Dr Marinda GLENWOOD [place] for CLD - No need for Abx at this time - Monitor abdominal examination; on-going bowel function   - Pain control prn; Antiemetics prn    - Hold Eliquis   - Further management per primary service; we will follow   All of the above findings and recommendations were discussed with the patient and her family (daughter at bedside), and all of their questions were answered to their expressed satisfaction.  Thank you for the opportunity to participate in this patient's care.   -- Arthea Platt, PA-C [facility] 01/17/2024, 11:42 AM M-F: 7am - 4pm

## 2024-01-17 NOTE — H&P (Signed)
 History and Physical    Patient: April Solis FMW:[id number] DOB: 02-Aug-1955 DOA: 01/16/2024 DOS: the patient was seen and examined on 01/17/2024 PCP: Glendia Shad, MD  Patient coming from: Home  Chief Complaint:  Chief Complaint  Patient presents with   Chest Pain    HPI: April Solis is a 68 y.o. female with medical history significant for PAF on Eliquis , HTN, HLD, DM 2, no history of CAD based on coronary calcium  score of 0 in the recent past, being admitted for biliary colic after presenting with epigastric and centralized chest pain starting at around 8 PM on 01/16/2024.  The pain radiated to the back and to the right abdomen.  She had no fever or chills, nausea or vomiting. In the ED, vitals unremarkable.  Labs which included CBC, BMP, hepatic function panel, lipase.Troponin 5->4 Limits all within normal EKG, showed sinus bradycardia at 59 with no ischemic changes CTA aorta showed no acute findings in the chest abdomen or pelvis.  Did show cholelithiasis without evidence of obstruction or acute infection 3 cm gallstone within the gallbladder. No CT evidence of acute cholecystitis...fatty infiltration. No focal hepatic abnormality.. The ED provider spoke with on-call surgeon Dr. Marinda who advised awaiting Eliquis  washout prior to any operative procedure. Patient was treated with multiple pain meds and IV antiemetics while in the ED but continued to have pain Admission requested     Review of Systems: As mentioned in the history of present illness. All other systems reviewed and are negative.  Past Medical History:  Diagnosis Date   Achilles rupture, right    Contact dermatitis and eczema due to plant    Diabetes mellitus without complication (HCC)    Environmental allergies    Glaucoma    History of chicken pox    Hyperlipidemia    Hypertension    Osteopenia    Paroxysmal A-fib (HCC)    Past Surgical History:  Procedure Laterality Date    ACHILLES TENDON REPAIR Right    BREAST BIOPSY Right 1999   neg   COLONOSCOPY WITH PROPOFOL  N/A 12/12/2017   Procedure: COLONOSCOPY WITH PROPOFOL ;  Surgeon: Viktoria Lamar DASEN, MD;  Location: [facility] ENDOSCOPY;  Service: Endoscopy;  Laterality: N/A;   COLONOSCOPY WITH PROPOFOL  N/A 06/24/2023   Procedure: COLONOSCOPY WITH PROPOFOL ;  Surgeon: Onita Elspeth Sharper, DO;  Location: [facility] ENDOSCOPY;  Service: Gastroenterology;  Laterality: N/A;  DM, 1ST CASE, PLEASE   TUBAL LIGATION     Social History:  reports that she has never smoked. She has never used smokeless tobacco. She reports that she does not drink alcohol and does not use drugs.  Allergies  Allergen Reactions   Influenza Vaccines Other (See Comments)    Flu symptoms two hours after injections   Rubbing Alcohol [Alcohol]    Other Rash    Poison ivy    Family History  Problem Relation Age of Onset   Colon polyps Mother    Alzheimer's disease Mother    Heart attack Father 43   Hypertension Father    Diabetes Father    Hypertension Sister    Diabetes Sister        x2   COPD Sister    Diabetes Sister    Hypertension Sister    Glaucoma Sister    Arrhythmia Son        Faster heart beat than normal   Breast cancer Neg Hx    Colon cancer Neg Hx     Prior to Admission medications  Medication Sig Start Date End Date Taking? Authorizing Provider  amLODipine  (NORVASC ) 5 MG tablet Take 1 tablet (5 mg total) by mouth daily. 07/19/23   Glendia Shad, MD  apixaban  (ELIQUIS ) 5 MG TABS tablet Take 1 tablet (5 mg total) by mouth 2 (two) times daily. 07/19/23   Glendia Shad, MD  dorzolamide-timolol (COSOPT) 22.3-6.8 MG/ML ophthalmic solution Place 1 drop into both eyes 2 (two) times daily. 07/05/14   [provider]  doxycycline  (ADOXA) 50 MG tablet Take 1-2 tabs PO QD PRN flares 04/11/23   Jackquline Sawyer, MD  empagliflozin  (JARDIANCE ) 25 MG TABS tablet Take 1 tablet (25 mg total) by mouth daily. 07/19/23   Glendia Shad, MD   finasteride  (PROSCAR ) 5 MG tablet Take 1 tablet (5 mg total) by mouth daily. 08/02/23   Jackquline Sawyer, MD  Fluocinolone  Acetonide Scalp 0.01 % OIL Apply to aa's scalp QD PRN flares. 10/13/21   Jackquline Sawyer, MD  glipiZIDE  (GLUCOTROL  XL) 2.5 MG 24 hr tablet Take 1 tablet (2.5 mg total) by mouth daily with breakfast. 07/19/23   Glendia Shad, MD  glucose blood (GE100 BLOOD GLUCOSE TEST) test strip TEST TWICE DAILY 07/19/23   Glendia Shad, MD  ketoconazole  (NIZORAL ) 2 % shampoo Shampoo into the scalp let sit several minutes then wash out. Use 2-3 days per week. 10/13/21   Jackquline Sawyer, MD  lisinopril  (ZESTRIL ) 10 MG tablet Take 1 tablet (10 mg total) by mouth daily. 07/19/23   Glendia Shad, MD  metFORMIN  (GLUCOPHAGE -XR) 500 MG 24 hr tablet TAKE TWO TABLETS BY MOUTH EVERY MORNING AND AT BEDTIME 10/14/23   Glendia Shad, MD  metoprolol  tartrate (LOPRESSOR ) 25 MG tablet Take 1 tablet (25 mg total) by mouth 2 (two) times daily. 07/19/23   Glendia Shad, MD  minoxidil  (LONITEN ) 2.5 MG tablet Take 1 tablet (2.5 mg total) by mouth daily. 08/02/23   Jackquline Sawyer, MD  Multiple Vitamin (MULTIVITAMIN) tablet Take 1 tablet by mouth daily.    [provider]  mupirocin  ointment (BACTROBAN ) 2 % Apply 1 Application topically 2 (two) times daily. Bid to right forehead 08/08/23   Jackquline Sawyer, MD  nystatin  cream (MYCOSTATIN ) Apply 1 Application topically 2 (two) times daily. 01/07/23   Glendia Shad, MD  rosuvastatin  (CRESTOR ) 10 MG tablet TAKE ONE TABLET EVERY DAY 10/14/23   Glendia Shad, MD  timolol (BETIMOL) 0.25 % ophthalmic solution Place 1-2 drops into both eyes daily.    [provider]  triamcinolone  cream (KENALOG ) 0.1 % APPLY TO THE AFFECTED AREA DAILY AS NEEDED 01/21/20   Glendia Shad, MD  vitamin E 200 UNIT capsule Take 200 Units by mouth daily.    [provider]    Physical Exam: Vitals:   01/16/24 2145 01/17/24 0141  BP: (!) 147/69 (!) 144/69  Pulse: (!) 55 75   Resp: 18 18  Temp: 98.4 F (36.9 C) 98.2 F (36.8 C)  TempSrc: Oral Oral  SpO2: 98% 99%  Weight: 74.8 kg   Height: 5' 1 (1.549 m)    Physical Exam Vitals and nursing note reviewed.  Constitutional:      General: She is not in acute distress. HENT:     Head: Normocephalic and atraumatic.  Cardiovascular:     Rate and Rhythm: Normal rate and regular rhythm.     Heart sounds: Normal heart sounds.  Pulmonary:     Effort: Pulmonary effort is normal.     Breath sounds: Normal breath sounds.  Abdominal:     Palpations: Abdomen is  soft.     Tenderness: There is abdominal tenderness in the right upper quadrant.  Neurological:     Mental Status: Mental status is at baseline.     Labs on Admission: I have personally reviewed following labs and imaging studies  CBC: Recent Labs  Lab 01/16/24 2146  WBC 7.4  HGB 13.8  HCT 41.6  MCV 91.4  PLT 200   Basic Metabolic Panel: Recent Labs  Lab 01/16/24 2146  NA 136  K 4.0  CL 100  CO2 24  GLUCOSE 162*  BUN 22  CREATININE 0.96  CALCIUM  9.3   GFR: Estimated Creatinine Clearance: 51.9 mL/min (by C-G formula based on SCr of 0.96 mg/dL). Liver Function Tests: Recent Labs  Lab 01/17/24 0000  AST 25  ALT 25  ALKPHOS 52  BILITOT 0.4  PROT 7.4  ALBUMIN 4.4   Recent Labs  Lab 01/17/24 0000  LIPASE 45   No results for input(s): AMMONIA in the last 168 hours. Coagulation Profile: No results for input(s): INR, PROTIME in the last 168 hours. Cardiac Enzymes: No results for input(s): CKTOTAL, CKMB, CKMBINDEX, TROPONINI in the last 168 hours. BNP (last 3 results) No results for input(s): PROBNP in the last 8760 hours. HbA1C: No results for input(s): HGBA1C in the last 72 hours. CBG: No results for input(s): GLUCAP in the last 168 hours. Lipid Profile: No results for input(s): CHOL, HDL, LDLCALC, TRIG, CHOLHDL, LDLDIRECT in the last 72 hours. Thyroid  Function Tests: No results for  input(s): TSH, T4TOTAL, FREET4, T3FREE, THYROIDAB in the last 72 hours. Anemia Panel: No results for input(s): VITAMINB12, FOLATE, FERRITIN, TIBC, IRON, RETICCTPCT in the last 72 hours. Urine analysis: No results found for: COLORURINE, APPEARANCEUR, LABSPEC, PHURINE, GLUCOSEU, HGBUR, BILIRUBINUR, KETONESUR, PROTEINUR, UROBILINOGEN, NITRITE, LEUKOCYTESUR  Radiological Exams on Admission: CT Angio Chest/Abd/Pel for Dissection W and/or Wo Contrast Result Date: 01/17/2024 CLINICAL DATA:  Acute aortic syndrome (AAS) suspected.  Chest pain EXAM: CT ANGIOGRAPHY CHEST, ABDOMEN AND PELVIS TECHNIQUE: Non-contrast CT of the chest was initially obtained. Multidetector CT imaging through the chest, abdomen and pelvis was performed using the standard protocol during bolus administration of intravenous contrast. Multiplanar reconstructed images and MIPs were obtained and reviewed to evaluate the vascular anatomy. RADIATION DOSE REDUCTION: This exam was performed according to the departmental dose-optimization program which includes automated exposure control, adjustment of the mA and/or kV according to patient size and/or use of iterative reconstruction technique. CONTRAST:  OMNIPAQUE  IOHEXOL  350 MG/ML SOLN COMPARISON:  05/28/2021 FINDINGS: CTA CHEST FINDINGS Cardiovascular: Heart is normal size. Aorta is normal caliber. No dissection. No filling defects in the pulmonary arteries to suggest pulmonary emboli. Mediastinum/Nodes: No mediastinal, hilar, or axillary adenopathy. Trachea and esophagus are unremarkable. Substernal left thyroid  nodule measures up to 2.3 cm. Lungs/Pleura: Lungs are clear. No focal airspace opacities or suspicious nodules. No effusions. No pneumothorax. Musculoskeletal: Chest wall soft tissues are unremarkable. No acute bony abnormality. Review of the MIP images confirms the above findings. CTA ABDOMEN AND PELVIS FINDINGS VASCULAR Aorta: Normal  caliber aorta without aneurysm, dissection, vasculitis or significant stenosis. Celiac: Patent without evidence of aneurysm, dissection, vasculitis or significant stenosis. SMA: Patent without evidence of aneurysm, dissection, vasculitis or significant stenosis. Renals: Both renal arteries are patent without evidence of aneurysm, dissection, vasculitis, fibromuscular dysplasia or significant stenosis. IMA: Patent without evidence of aneurysm, dissection, vasculitis or significant stenosis. Inflow: Patent without evidence of aneurysm, dissection, vasculitis or significant stenosis. Veins: No obvious venous abnormality within the limitations of this arterial phase  study. Review of the MIP images confirms the above findings. NON-VASCULAR Hepatobiliary: 3 cm gallstone within the gallbladder. No CT evidence of acute cholecystitis. Mild low-density throughout the liver compatible with fatty infiltration. No focal hepatic abnormality. Pancreas: No focal abnormality or ductal dilatation. Spleen: No focal abnormality.  Normal size. Adrenals/Urinary Tract: No adrenal abnormality. No focal renal abnormality. No stones or hydronephrosis. Urinary bladder is unremarkable. Stomach/Bowel: Normal appendix. Scattered colonic diverticula. No active diverticulitis. Stomach and small bowel decompressed. Lymphatic: No adenopathy Reproductive: Uterus and adnexa unremarkable.  No mass. Other: No free fluid or free air. Musculoskeletal: No acute bony abnormality. Review of the MIP images confirms the above findings. IMPRESSION: No evidence of aortic aneurysm or dissection. No evidence of pulmonary embolus. No acute findings in the chest, abdomen or pelvis. Cholelithiasis. Scattered colonic diverticulosis. Electronically Signed   By: Franky Crease M.D.   On: 01/17/2024 01:33   DG Chest 2 View Result Date: 01/16/2024 EXAM: 2 VIEW(S) XRAY OF THE CHEST 01/16/2024 10:14:30 PM COMPARISON: None available. CLINICAL HISTORY: cp. PER ER NOTE; Pt  reports centralized chest pain that began tonight around 2000, pt reports hx afib. Pt is on blood thinner and metoprolol . Pt also reports some dizziness. FINDINGS: LUNGS AND PLEURA: No focal pulmonary opacity. No pulmonary edema. No pleural effusion. No pneumothorax. HEART AND MEDIASTINUM: No acute abnormality of the cardiac and mediastinal silhouettes. BONES AND SOFT TISSUES: Degenerative changes of the thoracic spine with mild dextrocurvature. IMPRESSION: 1. No acute cardiopulmonary process. Electronically signed by: Pinkie Pebbles MD 01/16/2024 10:21 PM EDT RP Workstation: [id number]   Data Reviewed for HPI: Relevant notes from primary care and specialist visits, past discharge summaries as available in EHR, including Care Everywhere. Prior diagnostic testing as pertinent to current admission diagnoses Updated medications and problem lists for reconciliation ED course, including vitals, labs, imaging, treatment and response to treatment Triage notes, nursing and pharmacy notes and ED provider's notes Notable results as noted above in HPI      Assessment and Plan: Biliary colic CT showing 3 cm gallstone in gallbladder without evidence of acute cholecystitis Pain control, antiemetics Hold Eliquis  Surgery aware and formal consult placed  PAF (paroxysmal atrial fibrillation) (HCC) Holding Eliquis  in anticipation of surgery Continue metoprolol   Diabetes mellitus (HCC) Sliding scale insulin coverage  Hypertension Continue amlodipine  lisinopril  and metoprolol    DVT prophylaxis: SCD  Consults: surgery, Dr Marinda  Advance Care Planning: full code  Family Communication: Daughter at bedside  Disposition Plan: Back to previous home environment  Severity of Illness: The appropriate patient status for this patient is OBSERVATION. Observation status is judged to be reasonable and necessary in order to provide the required intensity of service to ensure the patient's safety. The  patient's presenting symptoms, physical exam findings, and initial radiographic and laboratory data in the context of their medical condition is felt to place them at decreased risk for further clinical deterioration. Furthermore, it is anticipated that the patient will be medically stable for discharge from the hospital within 2 midnights of admission.   Author: Delayne LULLA Solian, MD 01/17/2024 3:52 AM  For on call review [email or web address].

## 2024-01-17 NOTE — Progress Notes (Signed)
 PROGRESS NOTE  April Solis  FMW:[id number] DOB: [birth year] DOA: 01/16/2024 PCP: Glendia Shad, MD   Ms. April Solis is a 68 year old female with history of hypertension, hyperlipidemia, paroxysmal atrial fibrillation on Eliquis , non-insulin-dependent diabetes mellitus, who presents ED for chief concerns of epigastric pain and centralized chest pain and dizziness.  On 01/16/2024: Patient was admitted for biliary colic under hospitalist service.  Per admitting hospitalist note, general surgery has been formally consulted and is aware.  01/17/2024: Follow-up with general surgery ensure that Dr. Marinda is aware he has been consulted.  Continue as needed pain medication.  General surgery is aware and possible surgery tomorrow or Thursday.  Zosyn per pharmacy initiated.  Continue as needed pain medication. LR 100 mL/h, 1 day ordered  N.p.o. after midnight.  Assessment & Plan:   Principal Problem:   Biliary colic Active Problems:   Hypertension   Diabetes mellitus (HCC)   Hyperlipidemia associated with type 2 diabetes mellitus (HCC)   PAF (paroxysmal atrial fibrillation) (HCC)   Calculus of gallbladder without cholecystitis without obstruction   Assessment and Plan:  * Biliary colic With suspected cholecystitis CT showing 3 cm gallstone in gallbladder without evidence of acute cholecystitis Pain control, antiemetics Hold Eliquis  Surgery aware and formal consult placed  01/17/2024: Zosyn IV antibiotic per pharmacy initiated.  LR 100 mL/h, 1 day ordered  PAF (paroxysmal atrial fibrillation) (HCC) Holding Eliquis  in anticipation of surgery Patient took Eliquis  yesterday prior to ED presentation. Home metoprolol  25 mg p.o. twice daily resumed  Hyperlipidemia associated with type 2 diabetes mellitus (HCC) Rosuvastatin  10 mg daily  Diabetes mellitus (HCC) Home metformin  500 mg daily, glipizide  2.5 mg daily will not be resumed on admission Insulin SSI with at bedtime  coverage   Hypertension Continue home amlodipine  5 mg daily, lisinopril  10 mg daily, metoprolol  tartrate 25 mg p.o. twice daily Hydralazine 5 mg IV every 6 hours as needed for SBP > 165, 5 days ordered  DVT prophylaxis: Pharmacologic DVT not initiated.  AM team to initiate pharmacologic DVT when the benefits outweigh the risk. Code Status: Full code Family Communication: Updated son April Solis), daughter April Solis), and pastor [place]) at bedside with patient's permission Disposition Plan: Pending general surgery Level of care: Med-Surg  Consultants:  General surgery  Procedures:  Per general surgery  Antimicrobials: Zosyn  Subjective:  At bedside, she patient was awake alert and oriented to self, age, location, current calendar year.  She reports that at approximately 7:30 PM yesterday, she developed stabbing right upper quadrant and epigastric abdominal pain.  She reports she has never had this pain before.  She denies trauma to her person.  She reports she has not had dinner yet and had lunch many hours ago.  She denies chest pain, shortness of breath, abdominal pain, dysuria, hematuria, diarrhea, blood in her stool, syncope, loss of consciousness, double vision, blurry vision.  Objective: Vitals:   01/17/24 0932 01/17/24 1130 01/17/24 1214 01/17/24 1240  BP: 132/76 128/74 129/69 123/72  Pulse: 75 67 66 66  Resp:   17 18  Temp:   98.5 F (36.9 C) 97.6 F (36.4 C)  TempSrc:   Oral Oral  SpO2:   100% 98%  Weight:      Height:       Intake/Output Summary (Last 24 hours) at 01/17/2024 1514 Last data filed at 01/17/2024 1200 Gross per 24 hour  Intake 118 ml  Output 0 ml  Net 118 ml   Filed Weights   01/16/24 2145  Weight:  74.8 kg   Examination:  General exam: Appears calm and comfortable  Respiratory system: Clear to auscultation. Respiratory effort normal. Cardiovascular system: S1 & S2 heard, RRR. No JVD, murmurs, rubs, gallops or clicks. No pedal  edema. Gastrointestinal system: Right upper quadrant and epigastric tenderness with mild referral causing diffuse abdominal tenderness. Normal bowel sounds heard.  Obese abdomen Central nervous system: Alert and oriented. No focal neurological deficits. Extremities: Symmetric 5 x 5 power. Skin: No rashes, lesions or ulcers Psychiatry: Judgement and insight appear normal. Mood & affect appropriate.   Data Reviewed: I have personally reviewed following labs and imaging studies  CBC: Recent Labs  Lab 01/16/24 2146  WBC 7.4  HGB 13.8  HCT 41.6  MCV 91.4  PLT 200   Basic Metabolic Panel: Recent Labs  Lab 01/16/24 2146  NA 136  K 4.0  CL 100  CO2 24  GLUCOSE 162*  BUN 22  CREATININE 0.96  CALCIUM  9.3   GFR: Estimated Creatinine Clearance: 51.9 mL/min (by C-G formula based on SCr of 0.96 mg/dL).  Liver Function Tests: Recent Labs  Lab 01/17/24 0000  AST 25  ALT 25  ALKPHOS 52  BILITOT 0.4  PROT 7.4  ALBUMIN 4.4   Recent Labs  Lab 01/17/24 0000  LIPASE 45   CBG: Recent Labs  Lab 01/17/24 0756 01/17/24 1213  GLUCAP 147* 129*   Radiology Studies: CT Angio Chest/Abd/Pel for Dissection W and/or Wo Contrast Result Date: 01/17/2024 CLINICAL DATA:  Acute aortic syndrome (AAS) suspected.  Chest pain EXAM: CT ANGIOGRAPHY CHEST, ABDOMEN AND PELVIS TECHNIQUE: Non-contrast CT of the chest was initially obtained. Multidetector CT imaging through the chest, abdomen and pelvis was performed using the standard protocol during bolus administration of intravenous contrast. Multiplanar reconstructed images and MIPs were obtained and reviewed to evaluate the vascular anatomy. RADIATION DOSE REDUCTION: This exam was performed according to the departmental dose-optimization program which includes automated exposure control, adjustment of the mA and/or kV according to patient size and/or use of iterative reconstruction technique. CONTRAST:  OMNIPAQUE  IOHEXOL  350 MG/ML SOLN  COMPARISON:  05/28/2021 FINDINGS: CTA CHEST FINDINGS Cardiovascular: Heart is normal size. Aorta is normal caliber. No dissection. No filling defects in the pulmonary arteries to suggest pulmonary emboli. Mediastinum/Nodes: No mediastinal, hilar, or axillary adenopathy. Trachea and esophagus are unremarkable. Substernal left thyroid  nodule measures up to 2.3 cm. Lungs/Pleura: Lungs are clear. No focal airspace opacities or suspicious nodules. No effusions. No pneumothorax. Musculoskeletal: Chest wall soft tissues are unremarkable. No acute bony abnormality. Review of the MIP images confirms the above findings. CTA ABDOMEN AND PELVIS FINDINGS VASCULAR Aorta: Normal caliber aorta without aneurysm, dissection, vasculitis or significant stenosis. Celiac: Patent without evidence of aneurysm, dissection, vasculitis or significant stenosis. SMA: Patent without evidence of aneurysm, dissection, vasculitis or significant stenosis. Renals: Both renal arteries are patent without evidence of aneurysm, dissection, vasculitis, fibromuscular dysplasia or significant stenosis. IMA: Patent without evidence of aneurysm, dissection, vasculitis or significant stenosis. Inflow: Patent without evidence of aneurysm, dissection, vasculitis or significant stenosis. Veins: No obvious venous abnormality within the limitations of this arterial phase study. Review of the MIP images confirms the above findings. NON-VASCULAR Hepatobiliary: 3 cm gallstone within the gallbladder. No CT evidence of acute cholecystitis. Mild low-density throughout the liver compatible with fatty infiltration. No focal hepatic abnormality. Pancreas: No focal abnormality or ductal dilatation. Spleen: No focal abnormality.  Normal size. Adrenals/Urinary Tract: No adrenal abnormality. No focal renal abnormality. No stones or hydronephrosis. Urinary bladder is unremarkable. Stomach/Bowel:  Normal appendix. Scattered colonic diverticula. No active diverticulitis. Stomach  and small bowel decompressed. Lymphatic: No adenopathy Reproductive: Uterus and adnexa unremarkable.  No mass. Other: No free fluid or free air. Musculoskeletal: No acute bony abnormality. Review of the MIP images confirms the above findings. IMPRESSION: No evidence of aortic aneurysm or dissection. No evidence of pulmonary embolus. No acute findings in the chest, abdomen or pelvis. Cholelithiasis. Scattered colonic diverticulosis. Electronically Signed   By: Franky Crease M.D.   On: 01/17/2024 01:33   DG Chest 2 View Result Date: 01/16/2024 EXAM: 2 VIEW(S) XRAY OF THE CHEST 01/16/2024 10:14:30 PM COMPARISON: None available. CLINICAL HISTORY: cp. PER ER NOTE; Pt reports centralized chest pain that began tonight around 2000, pt reports hx afib. Pt is on blood thinner and metoprolol . Pt also reports some dizziness. FINDINGS: LUNGS AND PLEURA: No focal pulmonary opacity. No pulmonary edema. No pleural effusion. No pneumothorax. HEART AND MEDIASTINUM: No acute abnormality of the cardiac and mediastinal silhouettes. BONES AND SOFT TISSUES: Degenerative changes of the thoracic spine with mild dextrocurvature. IMPRESSION: 1. No acute cardiopulmonary process. Electronically signed by: Pinkie Pebbles MD 01/16/2024 10:21 PM EDT RP Workstation: [id number]   Scheduled Meds:  amLODipine   5 mg Oral Daily   insulin aspart  0-15 Units Subcutaneous TID WC   insulin aspart  0-5 Units Subcutaneous QHS   lisinopril   10 mg Oral Daily   metoprolol  tartrate  25 mg Oral BID   rosuvastatin   10 mg Oral Daily   Continuous Infusions:  lactated ringers     piperacillin-tazobactam (ZOSYN)  IV     promethazine (PHENERGAN) injection (IM or IVPB)      LOS: 0 days   Time spent: 35 minutes  Dr. Sherre [facility] If 7PM-7AM, please contact night-coverage  01/17/2024, 3:14 PM

## 2024-01-17 NOTE — Assessment & Plan Note (Addendum)
 Continue home amlodipine  5 mg daily, lisinopril  10 mg daily, metoprolol  tartrate 25 mg p.o. twice daily Hydralazine 5 mg IV every 6 hours as needed for SBP > 165, 5 days ordered

## 2024-01-17 NOTE — TOC CM/SW Note (Signed)
 Transition of Care [facility]) - Inpatient Brief Assessment   Patient Details  Name: Fiorella Hanahan MRN: [id number] Date of Birth: [birth year]  Transition of Care [facility]) CM/SW Contact:    Corean ONEIDA Haddock, RN Phone Number: 01/17/2024, 4:24 PM   Clinical Narrative:  Transition of Care [facility]) Screening Note   Patient Details  Name: Emalynn Clewis Date of Birth: Aug 28, 1955   Transition of Care [facility]) CM/SW Contact:    Corean ONEIDA Haddock, RN Phone Number: 01/17/2024, 4:24 PM    Transition of Care Department [facility]) has reviewed patient and no TOC needs have been identified at this time.  If new patient transition needs arise, please place a TOC consult.     Transition of Care Asessment: Insurance and Status: Insurance coverage has been reviewed Patient has primary care physician: Yes     Prior/Current Home Services: No current home services Social Drivers of Health Review: SDOH reviewed no interventions necessary Readmission risk has been reviewed: Yes Transition of care needs: no transition of care needs at this time

## 2024-01-18 ENCOUNTER — Inpatient Hospital Stay: Admitting: Certified Registered Nurse Anesthetist

## 2024-01-18 ENCOUNTER — Encounter
Admission: EM | Disposition: A | Discharge status: Home / Self Care | Payer: Self-pay | Source: Home / Self Care | Attending: Internal Medicine

## 2024-01-18 DIAGNOSIS — I480 Paroxysmal atrial fibrillation: Secondary | ICD-10-CM | POA: Diagnosis not present

## 2024-01-18 DIAGNOSIS — E1165 Type 2 diabetes mellitus with hyperglycemia: Secondary | ICD-10-CM

## 2024-01-18 DIAGNOSIS — K810 Acute cholecystitis: Secondary | ICD-10-CM | POA: Diagnosis not present

## 2024-01-18 DIAGNOSIS — K8050 Calculus of bile duct without cholangitis or cholecystitis without obstruction: Secondary | ICD-10-CM | POA: Diagnosis not present

## 2024-01-18 HISTORY — PX: CHOLECYSTECTOMY: SHX55

## 2024-01-18 LAB — GLUCOSE, CAPILLARY
Glucose-Capillary: 154 mg/dL — ABNORMAL HIGH (ref 70–99)
Glucose-Capillary: 192 mg/dL — ABNORMAL HIGH (ref 70–99)
Glucose-Capillary: 159 mg/dL — ABNORMAL HIGH (ref 70–99)
Glucose-Capillary: 260 mg/dL — ABNORMAL HIGH (ref 70–99)

## 2024-01-18 LAB — CBC
HCT: 43.2 % (ref 36.0–46.0)
Hemoglobin: 14.6 g/dL (ref 12.0–15.0)
MCH: 30.5 pg (ref 26.0–34.0)
MCHC: 33.8 g/dL (ref 30.0–36.0)
MCV: 90.2 fL (ref 80.0–100.0)
Platelets: 181 K/uL (ref 150–400)
RBC: 4.79 MIL/uL (ref 3.87–5.11)
RDW: 13.1 % (ref 11.5–15.5)
WBC: 16.8 K/uL — ABNORMAL HIGH (ref 4.0–10.5)
nRBC: 0 % (ref 0.0–0.2)

## 2024-01-18 LAB — BASIC METABOLIC PANEL WITH GFR
Anion gap: 12 (ref 5–15)
BUN: 11 mg/dL (ref 8–23)
CO2: 24 mmol/L (ref 22–32)
Calcium: 8.6 mg/dL — ABNORMAL LOW (ref 8.9–10.3)
Chloride: 99 mmol/L (ref 98–111)
Creatinine, Ser: 0.96 mg/dL (ref 0.44–1.00)
GFR, Estimated: >60 mL/min (ref >60)
Glucose, Bld: 161 mg/dL — ABNORMAL HIGH (ref 70–99)
Potassium: 3.7 mmol/L (ref 3.5–5.1)
Sodium: 135 mmol/L (ref 135–145)

## 2024-01-18 SURGERY — CHOLECYSTECTOMY, ROBOT-ASSISTED, LAPAROSCOPIC
Anesthesia: General

## 2024-01-18 MED ORDER — PHENYLEPHRINE HCL-NACL 20-0.9 MG/250ML-% IV SOLN
INTRAVENOUS | Status: AC
Start: 2024-01-18 — End: 2024-01-18
  Filled 2024-01-18: qty 250

## 2024-01-18 MED ORDER — PHENYLEPHRINE HCL-NACL 20-0.9 MG/250ML-% IV SOLN
INTRAVENOUS | Status: DC | PRN
  Administered 2024-01-18: 80 ug/min via INTRAVENOUS

## 2024-01-18 MED ORDER — PHENYLEPHRINE 80 MCG/ML (10ML) SYRINGE FOR IV PUSH (FOR BLOOD PRESSURE SUPPORT)
PREFILLED_SYRINGE | INTRAVENOUS | Status: DC | PRN
  Administered 2024-01-18 (×4): 200 ug via INTRAVENOUS

## 2024-01-18 MED ORDER — DEXAMETHASONE SOD PHOSPHATE PF 10 MG/ML IJ SOLN
INTRAMUSCULAR | Status: DC | PRN
  Administered 2024-01-18: 5 mg via INTRAVENOUS

## 2024-01-18 MED ORDER — DILTIAZEM HCL-DEXTROSE 125-5 MG/125ML-% IV SOLN (PREMIX)
5.0000 mg/h | INTRAVENOUS | Status: DC
  Filled 2024-01-18: qty 125

## 2024-01-18 MED ORDER — HEMOSTATIC AGENTS (NO CHARGE) OPTIME
TOPICAL | Status: DC | PRN
  Administered 2024-01-18: 1 via TOPICAL

## 2024-01-18 MED ORDER — OXYCODONE HCL 5 MG PO TABS
5.0000 mg | ORAL_TABLET | Freq: Once | ORAL | Status: AC | PRN
  Administered 2024-01-18: 5 mg via ORAL

## 2024-01-18 MED ORDER — MIDAZOLAM HCL 2 MG/2ML IJ SOLN
INTRAMUSCULAR | Status: AC
  Filled 2024-01-18: qty 2

## 2024-01-18 MED ORDER — FENTANYL CITRATE (PF) 100 MCG/2ML IJ SOLN
INTRAMUSCULAR | Status: AC
  Filled 2024-01-18: qty 2

## 2024-01-18 MED ORDER — ROCURONIUM BROMIDE 100 MG/10ML IV SOLN
INTRAVENOUS | Status: DC | PRN
  Administered 2024-01-18: 10 mg via INTRAVENOUS
  Administered 2024-01-18: 50 mg via INTRAVENOUS

## 2024-01-18 MED ORDER — FENTANYL CITRATE (PF) 100 MCG/2ML IJ SOLN
25.0000 ug | INTRAMUSCULAR | Status: AC | PRN
  Administered 2024-01-18 (×8): 25 ug via INTRAVENOUS

## 2024-01-18 MED ORDER — PROPOFOL 10 MG/ML IV BOLUS
INTRAVENOUS | Status: AC
  Filled 2024-01-18: qty 20

## 2024-01-18 MED ORDER — OXYCODONE HCL 5 MG PO TABS
ORAL_TABLET | ORAL | Status: AC
  Filled 2024-01-18: qty 1

## 2024-01-18 MED ORDER — SODIUM CHLORIDE 0.9 % IR SOLN
Status: DC | PRN
  Administered 2024-01-18: 1000 mL

## 2024-01-18 MED ORDER — ACETAMINOPHEN 10 MG/ML IV SOLN
1000.0000 mg | Freq: Once | INTRAVENOUS | Status: DC | PRN
  Administered 2024-01-18: 1000 mg via INTRAVENOUS

## 2024-01-18 MED ORDER — MIDAZOLAM HCL 2 MG/2ML IJ SOLN
INTRAMUSCULAR | Status: DC | PRN
  Administered 2024-01-18: 2 mg via INTRAVENOUS

## 2024-01-18 MED ORDER — METOPROLOL TARTRATE 5 MG/5ML IV SOLN
INTRAVENOUS | Status: DC | PRN
  Administered 2024-01-18 (×2): 2.5 mg via INTRAVENOUS

## 2024-01-18 MED ORDER — LIDOCAINE HCL (PF) 2 % IJ SOLN
INTRAMUSCULAR | Status: AC
  Filled 2024-01-18: qty 5

## 2024-01-18 MED ORDER — PROPOFOL 10 MG/ML IV BOLUS
INTRAVENOUS | Status: DC | PRN
Start: 2024-01-18 — End: 2024-01-18
  Administered 2024-01-18: 90 mg via INTRAVENOUS

## 2024-01-18 MED ORDER — LACTATED RINGERS IV SOLN: INTRAVENOUS | Status: AC

## 2024-01-18 MED ORDER — SEVOFLURANE IN SOLN
RESPIRATORY_TRACT | Status: AC
  Filled 2024-01-18: qty 250

## 2024-01-18 MED ORDER — BUPIVACAINE-EPINEPHRINE (PF) 0.5% -1:200000 IJ SOLN
INTRAMUSCULAR | Status: DC | PRN
  Administered 2024-01-18: 30 mL via PERINEURAL

## 2024-01-18 MED ORDER — BUPIVACAINE-EPINEPHRINE (PF) 0.5% -1:200000 IJ SOLN
INTRAMUSCULAR | Status: AC
  Filled 2024-01-18: qty 30

## 2024-01-18 MED ORDER — DILTIAZEM HCL 25 MG/5ML IV SOLN
5.0000 mg | Freq: Once | INTRAVENOUS | Status: AC
  Administered 2024-01-18: 5 mg via INTRAVENOUS
  Filled 2024-01-18 (×2): qty 5

## 2024-01-18 MED ORDER — ROCURONIUM BROMIDE 10 MG/ML (PF) SYRINGE
PREFILLED_SYRINGE | INTRAVENOUS | Status: AC
  Filled 2024-01-18: qty 10

## 2024-01-18 MED ORDER — LIDOCAINE HCL (CARDIAC) PF 100 MG/5ML IV SOSY
PREFILLED_SYRINGE | INTRAVENOUS | Status: DC | PRN
  Administered 2024-01-18: 60 mg via INTRAVENOUS

## 2024-01-18 MED ORDER — DROPERIDOL 2.5 MG/ML IJ SOLN: 0.6250 mg | Freq: Once | INTRAMUSCULAR | Status: DC | PRN

## 2024-01-18 MED ORDER — FENTANYL CITRATE (PF) 100 MCG/2ML IJ SOLN
INTRAMUSCULAR | Status: DC | PRN
  Administered 2024-01-18 (×4): 50 ug via INTRAVENOUS

## 2024-01-18 MED ORDER — DILTIAZEM HCL-DEXTROSE 125-5 MG/125ML-% IV SOLN (PREMIX)
5.0000 mg/h | INTRAVENOUS | Status: DC
  Administered 2024-01-18 (×2): 10 mg/h via INTRAVENOUS
  Administered 2024-01-19: 12.5 mg/h via INTRAVENOUS
  Filled 2024-01-18 (×3): qty 125

## 2024-01-18 MED ORDER — 0.9 % SODIUM CHLORIDE (POUR BTL) OPTIME
TOPICAL | Status: DC | PRN
  Administered 2024-01-18: 500 mL

## 2024-01-18 MED ORDER — OXYCODONE HCL 5 MG/5ML PO SOLN: 5.0000 mg | Freq: Once | ORAL | Status: AC | PRN

## 2024-01-18 MED ORDER — PIPERACILLIN-TAZOBACTAM 3.375 G IVPB
3.3750 g | Freq: Three times a day (TID) | INTRAVENOUS | Status: AC
  Administered 2024-01-19 (×3): 3.375 g via INTRAVENOUS
  Filled 2024-01-18 (×3): qty 50

## 2024-01-18 MED ORDER — ACETAMINOPHEN 10 MG/ML IV SOLN
INTRAVENOUS | Status: AC
  Filled 2024-01-18: qty 100

## 2024-01-18 MED ORDER — INDOCYANINE GREEN 25 MG IV SOLR
1.2500 mg | Freq: Once | INTRAVENOUS | Status: AC
  Administered 2024-01-18: 1.25 mg via TOPICAL

## 2024-01-18 MED ORDER — ONDANSETRON HCL 4 MG/2ML IJ SOLN
INTRAMUSCULAR | Status: DC | PRN
  Administered 2024-01-18: 4 mg via INTRAVENOUS

## 2024-01-18 MED ORDER — SUGAMMADEX SODIUM 200 MG/2ML IV SOLN
INTRAVENOUS | Status: DC | PRN
  Administered 2024-01-18: 200 mg via INTRAVENOUS

## 2024-01-18 MED ORDER — ONDANSETRON HCL 4 MG/2ML IJ SOLN
INTRAMUSCULAR | Status: AC
  Filled 2024-01-18: qty 2

## 2024-01-18 SURGICAL SUPPLY — 40 items
BAG PRESSURE INF REUSE 1000 (BAG) IMPLANT
CANNULA REDUCER 12-8 DVNC XI (CANNULA) ×1 IMPLANT
CAUTERY HOOK MNPLR 1.6 DVNC XI (INSTRUMENTS) ×1 IMPLANT
CLIP LIGATING HEMO O LOK GREEN (MISCELLANEOUS) ×1 IMPLANT
DEFOGGER SCOPE WARM SEASHARP (MISCELLANEOUS) ×1 IMPLANT
DERMABOND ADVANCED .7 DNX12 (GAUZE/BANDAGES/DRESSINGS) ×1 IMPLANT
DRAPE ARM DVNC X/XI (DISPOSABLE) ×4 IMPLANT
DRAPE COLUMN DVNC XI (DISPOSABLE) ×1 IMPLANT
ELECTRODE REM PT RTRN 9FT ADLT (ELECTROSURGICAL) ×1 IMPLANT
FORCEPS BPLR 8 MD DVNC XI (FORCEP) IMPLANT
FORCEPS BPLR R/ABLATION 8 DVNC (INSTRUMENTS) ×1 IMPLANT
FORCEPS PROGRASP DVNC XI (FORCEP) ×1 IMPLANT
GLOVE BIOGEL PI IND STRL 7.5 (GLOVE) ×2 IMPLANT
GLOVE SURG SYN 7.0 PF PI (GLOVE) ×2 IMPLANT
GOWN STRL REUS W/ TWL LRG LVL3 (GOWN DISPOSABLE) ×3 IMPLANT
GRASPER SUT TROCAR 14GX15 (MISCELLANEOUS) ×1 IMPLANT
IRRIGATOR SUCT 8 DISP DVNC XI (IRRIGATION / IRRIGATOR) IMPLANT
IV 0.9% NACL 1000 ML (IV SOLUTION) IMPLANT
KIT PINK PAD W/HEAD ARM REST (MISCELLANEOUS) ×1 IMPLANT
LABEL OR SOLS (LABEL) ×1 IMPLANT
MANIFOLD NEPTUNE II (INSTRUMENTS) ×1 IMPLANT
NDL HYPO 22X1.5 SAFETY MO (MISCELLANEOUS) ×1 IMPLANT
NDL INSUFFLATION 14GA 120MM (NEEDLE) ×1 IMPLANT
NEEDLE HYPO 22X1.5 SAFETY MO (MISCELLANEOUS) ×1 IMPLANT
NEEDLE INSUFFLATION 14GA 120MM (NEEDLE) ×1 IMPLANT
NS IRRIG 500ML POUR BTL (IV SOLUTION) ×1 IMPLANT
OBTURATOR OPTICALSTD 8 DVNC (TROCAR) ×1 IMPLANT
PACK LAP CHOLECYSTECTOMY (MISCELLANEOUS) ×1 IMPLANT
POWDER SURGICEL 3.0 GRAM (HEMOSTASIS) IMPLANT
SEAL UNIV 5-12 XI (MISCELLANEOUS) ×4 IMPLANT
SET TUBE SMOKE EVAC HIGH FLOW (TUBING) ×1 IMPLANT
SOLUTION ELECTROSURG ANTI STCK (MISCELLANEOUS) ×1 IMPLANT
SPIKE FLUID TRANSFER (MISCELLANEOUS) ×2 IMPLANT
SPONGE T-LAP 4X18 ~~LOC~~+RFID (SPONGE) IMPLANT
SUT VICRYL 0 UR6 27IN ABS (SUTURE) ×1 IMPLANT
SUTURE MNCRL 4-0 27XMF (SUTURE) ×1 IMPLANT
SYR 20ML LL LF (SYRINGE) IMPLANT
SYSTEM BAG RETRIEVAL 10MM (BASKET) ×1 IMPLANT
TIP ENDOSCOPIC SURGICEL (TIP) IMPLANT
WATER STERILE IRR 500ML POUR (IV SOLUTION) ×1 IMPLANT

## 2024-01-18 NOTE — Anesthesia Preprocedure Evaluation (Signed)
 Anesthesia Evaluation  Patient identified by MRN, date of birth, ID band Patient awake    Reviewed: Allergy & Precautions, H&P , NPO status , Patient's Chart, lab work & pertinent test results, reviewed documented beta blocker date and time   Airway Mallampati: II  TM Distance: >3 FB Neck ROM: full    Dental  (+) Teeth Intact   Pulmonary neg pulmonary ROS   Pulmonary exam normal        Cardiovascular Exercise Tolerance: Good hypertension, On Medications negative cardio ROS Atrial Fibrillation  Rhythm:regular Rate:Normal     Neuro/Psych negative neurological ROS  negative psych ROS   GI/Hepatic negative GI ROS, Neg liver ROS,,,  Endo/Other  negative endocrine ROSdiabetes, Well Controlled, Type 2, Oral Hypoglycemic Agents    Renal/GU negative Renal ROS  negative genitourinary   Musculoskeletal   Abdominal   Peds  Hematology negative hematology ROS (+)   Anesthesia Other Findings Past Medical History: No date: Achilles rupture, right No date: Contact dermatitis and eczema due to plant No date: Diabetes mellitus without complication (HCC) No date: Environmental allergies No date: Glaucoma No date: History of chicken pox No date: Hyperlipidemia No date: Hypertension No date: Osteopenia No date: Paroxysmal A-fib [facility]) Past Surgical History: No date: ACHILLES TENDON REPAIR; Right 1999: BREAST BIOPSY; Right     Comment:  neg 12/12/2017: COLONOSCOPY WITH PROPOFOL ; N/A     Comment:  Procedure: COLONOSCOPY WITH PROPOFOL ;  Surgeon: Viktoria Lamar DASEN, MD;  Location: [facility] ENDOSCOPY;  Service:               Endoscopy;  Laterality: N/A; 06/24/2023: COLONOSCOPY WITH PROPOFOL ; N/A     Comment:  Procedure: COLONOSCOPY WITH PROPOFOL ;  Surgeon: Onita Elspeth Sharper, DO;  Location: [facility] ENDOSCOPY;  Service:               Gastroenterology;  Laterality: N/A;  DM, 1ST CASE, PLEASE No date: TUBAL  LIGATION BMI    Body Mass Index: 31.18 kg/m     Reproductive/Obstetrics negative OB ROS                              Anesthesia Physical Anesthesia Plan  ASA: 3 and emergent  Anesthesia Plan: General ETT   Post-op Pain Management:    Induction:   PONV Risk Score and Plan: 4 or greater  Airway Management Planned:   Additional Equipment:   Intra-op Plan:   Post-operative Plan:   Informed Consent: I have reviewed the patients History and Physical, chart, labs and discussed the procedure including the risks, benefits and alternatives for the proposed anesthesia with the patient or authorized representative who has indicated his/her understanding and acceptance.     Dental Advisory Given  Plan Discussed with: CRNA  Anesthesia Plan Comments:         Anesthesia Quick Evaluation

## 2024-01-18 NOTE — Progress Notes (Signed)
 Patient s/p robo chole and did have acute cholecystitis. Okay for CLD today and if feeling fine this evening can advance. Will need three doses of perioperative abx (zosyn).

## 2024-01-18 NOTE — Plan of Care (Incomplete)
  Problem: Activity: Goal: Risk for activity intolerance will decrease Outcome: Progressing   Problem: Pain Managment: Goal: General experience of comfort will improve and/or be controlled Outcome: Progressing   Problem: Safety: Goal: Ability to remain free from injury will improve Outcome: Progressing   Problem: Skin Integrity: Goal: Risk for impaired skin integrity will decrease Outcome: Progressing

## 2024-01-18 NOTE — Progress Notes (Signed)
 Patient is doing well, waiting for a room assignment, patient able to talk to her son on the phone.

## 2024-01-18 NOTE — Anesthesia Procedure Notes (Signed)
 Procedure Name: Intubation Date/Time: 01/18/2024 9:09 AM  Performed by: Niki Manus SAUNDERS, CRNAPre-anesthesia Checklist: Patient identified, Patient being monitored, Timeout performed, Emergency Drugs available and Suction available Patient Re-evaluated:Patient Re-evaluated prior to induction Oxygen Delivery Method: Circle system utilized Preoxygenation: Pre-oxygenation with 100% oxygen Induction Type: IV induction Ventilation: Mask ventilation without difficulty Laryngoscope Size: 3 and Glidescope Grade View: Grade I Tube type: Oral Tube size: 7.0 mm Number of attempts: 1 Airway Equipment and Method: Stylet Placement Confirmation: ETT inserted through vocal cords under direct vision, positive ETCO2 and breath sounds checked- equal and bilateral Secured at: 21 cm Tube secured with: Tape Dental Injury: Teeth and Oropharynx as per pre-operative assessment

## 2024-01-18 NOTE — Progress Notes (Signed)
  Progress Note   Patient: April Solis FMW:[id number] DOB: 10-05-1955 DOA: 01/16/2024     1 DOS: the patient was seen and examined on 01/18/2024   Brief hospital course: Ms. Cloris Flippo is a 68 year old female with history of hypertension, hyperlipidemia, paroxysmal atrial fibrillation on Eliquis , non-insulin-dependent diabetes mellitus, who presents ED for chief concerns of epigastric pain and centralized chest pain and dizziness. CT scan showed gallstone, patient has biliary colic. Patient was started started on Zosyn, cholecystectomy was performed on 10/15, gallbladder appeared inflated. Postoperatively, patient converted to atrial fibrillation with RVR.  Started on diltiazem.    Principal Problem:   Biliary colic Active Problems:   Hypertension   Diabetes mellitus (HCC)   Hyperlipidemia associated with type 2 diabetes mellitus (HCC)   Paroxysmal atrial fibrillation with rapid ventricular response (HCC)   Calculus of gallbladder without cholecystitis without obstruction   Assessment and Plan: Paroxysmal atrial fibrillation with rapid ventricular response (HCC) Patient developed significant tachycardia after surgery, heart rate went up to 130s, diltiazem drip was started. Patient will be sent to progressive unit for further monitoring. Eliquis  will be On hold for today, potentially can restart tomorrow.  * Biliary colic Acute cholecystitis ruled in Patient is status post cholecystectomy, gallbladder appeared to be inflated during surgery. Patient has a confirmed cholecystitis. Per general surgery, patient need to complete 3 doses of Zosyn, then discontinue.  Hyperlipidemia associated with type 2 diabetes mellitus (HCC) Rosuvastatin  10 mg daily  Diabetes mellitus (HCC) Home metformin  500 mg daily, glipizide  2.5 mg daily will not be resumed on admission Insulin SSI with at bedtime coverage   Hypertension Patient be continued on diltiazem drip, also on  metoprolol .  Hold off lisinopril  to allow more medicine for heart rate control.     Subjective:  She is still sleepy postop.  No abdominal pain.  Physical Exam: Vitals:   01/18/24 1146 01/18/24 1159 01/18/24 1200 01/18/24 1208  BP: 122/80  130/86   Pulse: (!) 131 (!) 122 93 (!) 119  Resp: 20 17 17 17   Temp:      TempSrc:      SpO2: 96% 93% 94% 94%  Weight:      Height:       General exam: Appears calm and comfortable  Respiratory system: Clear to auscultation. Respiratory effort normal. Cardiovascular system: Irregularly irregular and tachycardic no JVD, murmurs, rubs, gallops or clicks. No pedal edema. Gastrointestinal system: Abdomen is nondistended, soft and nontender. No organomegaly or masses felt. Normal bowel sounds heard. Central nervous system: Drowsy and oriented. No focal neurological deficits. Extremities: Symmetric 5 x 5 power. Skin: No rashes, lesions or ulcers Psychiatry: Judgement and insight appear normal. Mood & affect appropriate.    Data Reviewed:  Reviewed CT scan result and the lab results  Family Communication: None  Disposition: Status is: Inpatient Remains inpatient appropriate because: Severity of disease, IV treatment.     Time spent: 50 minutes  Author: Murvin Mana, MD 01/18/2024 12:29 PM  For on call review [email or web address].

## 2024-01-18 NOTE — Plan of Care (Signed)
  Problem: Skin Integrity: Goal: Risk for impaired skin integrity will decrease Outcome: Progressing   Problem: Activity: Goal: Risk for activity intolerance will decrease 01/18/2024 0445 by Tita Olam BROCKS, RN Outcome: Progressing 01/18/2024 0444 by Tita Olam BROCKS, RN Outcome: Progressing   Problem: Nutrition: Goal: Adequate nutrition will be maintained Outcome: Progressing   Problem: Pain Managment: Goal: General experience of comfort will improve and/or be controlled 01/18/2024 0445 by Tita Olam BROCKS, RN Outcome: Progressing 01/18/2024 0444 by Tita Olam BROCKS, RN Outcome: Progressing   Problem: Safety: Goal: Ability to remain free from injury will improve 01/18/2024 0445 by Tita Olam BROCKS, RN Outcome: Progressing 01/18/2024 0444 by Tita Olam BROCKS, RN Outcome: Progressing   Problem: Skin Integrity: Goal: Risk for impaired skin integrity will decrease Outcome: Progressing

## 2024-01-18 NOTE — Op Note (Signed)
 Robotic assisted laparoscopic Cholecystectomy  Pre-operative Diagnosis: Acute CHolecystitis  Post-operative Diagnosis: acute cholecystitis  Procedure:  Robotic assisted laparoscopic Cholecystectomy  Surgeon: Jayson Endow, MD  Anesthesia: Gen. with endotracheal tube  Findings: Critical view of safety obtained, gallbladder inflamed and needed decompression with aspiration needle  Estimated Blood Loss: 20 cc       Specimens: Gallbladder           Complications: none   Procedure Details  The patient was seen again in the Holding Room. The benefits, complications, treatment options, and expected outcomes were discussed with the patient. The risks of bleeding, infection, recurrence of symptoms, failure to resolve symptoms, bile duct damage, bile duct leak, retained common bile duct stone, bowel injury, any of which could require further surgery and/or ERCP, stent, or papillotomy were reviewed with the patient. The likelihood of improving the patient's symptoms with return to their baseline status is good.  The patient and/or family concurred with the proposed plan, giving informed consent.  The patient was taken to Operating Room, identified  and the procedure verified as robotic Cholecystectomy.  A Time Out was held and the above information confirmed.  Prior to the induction of general anesthesia, antibiotic prophylaxis was administered. VTE prophylaxis was in place. General endotracheal anesthesia was then administered and tolerated well. After the induction, the abdomen was prepped with Chloraprep and draped in the sterile fashion. The patient was positioned in the supine position.  A veress needle was inserted into the abdomen using standard drop technique. An 8mm infra-umbilical robotic port was then placed under direct visualization. There was no injury noted at the site of veress needle insertion. Two right sided abdominal 8mm ports followed by an 8mm left abdominal robotic ports were  placed under direct visualization. The left sided abdominal port was then upsized to a 12mm robotic port.  The patient was positioned  in reverse Trendelenburg, robot was brought to the surgical field and docked in the standard fashion.  We made sure all the instrumentation was kept indirect view at all times and that there were no collision between the arms. I scrubbed out and went to the console.  The gallbladder was identified, the fundus grasped and retracted cephalad. Adhesions were lysed bluntly. The infundibulum was grasped and retracted laterally, exposing the peritoneum overlying the triangle of Calot. This was then divided and exposed in a blunt fashion. An extended critical view of the cystic duct and cystic artery was obtained.  The cystic duct was clearly identified and bluntly dissected.   Artery and duct were double clipped and divided. Using ICG cholangiography we visualized the cystic duct. The gallbladder was taken from the gallbladder fossa in a retrograde fashion with the electrocautery.  Hemostasis was achieved with the electrocautery. nspection of the right upper quadrant was performed. No bleeding, bile duct injury or leak, or bowel injury was noted. Robotic instruments and robotic arms were undocked in the standard fashion.  I scrubbed back in.  The gallbladder was removed and placed in an Endocatch bag.   The left lower quadrant fascia was then closed with a 0 vicryl using a suture needle passer. The pre-peritoneal space was then infiltrated with liposomal bupivicaine and marcaine solution. Pneumoperitoneum was released.  4-0 subcuticular Monocryl was used to close the skin. Dermabond was  applied.  The patient was then extubated and brought to the recovery room in stable condition. Sponge, lap, and needle counts were correct at closure and at the conclusion of the case.  Jayson Endow, M.D. [facility]

## 2024-01-18 NOTE — Transfer of Care (Signed)
 Immediate Anesthesia Transfer of Care Note  Patient: April Solis  Procedure(s) Performed: CHOLECYSTECTOMY, ROBOT-ASSISTED, LAPAROSCOPIC  Patient Location: PACU  Anesthesia Type:MAC  Level of Consciousness: awake and alert   Airway & Oxygen Therapy: Patient Spontanous Breathing and Patient connected to nasal cannula oxygen  Post-op Assessment: Report given to RN and Post -op Vital signs reviewed and stable  Post vital signs: Reviewed and stable  Last Vitals:  Vitals Value Taken Time  BP 137/80 01/18/24 11:01  Temp    Pulse 147 01/18/24 11:05  Resp 16 01/18/24 11:05  SpO2 91 % 01/18/24 11:05  Vitals shown include unfiled device data.  Last Pain:  Vitals:   01/18/24 0805  TempSrc: Temporal  PainSc: 4       Patients Stated Pain Goal: 0 (01/18/24 0556)  Complications: No notable events documented.

## 2024-01-18 NOTE — Progress Notes (Signed)
 Updated patient family by messaging service.

## 2024-01-18 NOTE — Progress Notes (Signed)
 Patient with biliary colic, possibly acute cholecystitis. Discussed r/b/a of robotic assisted cholecystectomy with ICG. She understands and we will proceed with surgery.

## 2024-01-19 DIAGNOSIS — I10 Essential (primary) hypertension: Secondary | ICD-10-CM

## 2024-01-19 DIAGNOSIS — R079 Chest pain, unspecified: Secondary | ICD-10-CM

## 2024-01-19 DIAGNOSIS — I480 Paroxysmal atrial fibrillation: Secondary | ICD-10-CM

## 2024-01-19 DIAGNOSIS — K8020 Calculus of gallbladder without cholecystitis without obstruction: Secondary | ICD-10-CM

## 2024-01-19 DIAGNOSIS — E1165 Type 2 diabetes mellitus with hyperglycemia: Secondary | ICD-10-CM | POA: Diagnosis not present

## 2024-01-19 DIAGNOSIS — K8050 Calculus of bile duct without cholangitis or cholecystitis without obstruction: Secondary | ICD-10-CM | POA: Diagnosis not present

## 2024-01-19 DIAGNOSIS — E1169 Type 2 diabetes mellitus with other specified complication: Secondary | ICD-10-CM

## 2024-01-19 DIAGNOSIS — E785 Hyperlipidemia, unspecified: Secondary | ICD-10-CM

## 2024-01-19 LAB — GLUCOSE, CAPILLARY
Glucose-Capillary: 147 mg/dL — ABNORMAL HIGH (ref 70–99)
Glucose-Capillary: 178 mg/dL — ABNORMAL HIGH (ref 70–99)
Glucose-Capillary: 189 mg/dL — ABNORMAL HIGH (ref 70–99)
Glucose-Capillary: 131 mg/dL — ABNORMAL HIGH (ref 70–99)

## 2024-01-19 LAB — PROTIME-INR
INR: 1.5 — ABNORMAL HIGH (ref 0.8–1.2)
Prothrombin Time: 18.6 s — ABNORMAL HIGH (ref 11.4–15.2)

## 2024-01-19 LAB — SURGICAL PATHOLOGY

## 2024-01-19 LAB — HEPARIN LEVEL (UNFRACTIONATED): Heparin Unfractionated: 0.34 [IU]/mL (ref 0.30–0.70)

## 2024-01-19 LAB — APTT
aPTT: 42 s — ABNORMAL HIGH (ref 24–36)
aPTT: 59 s — ABNORMAL HIGH (ref 24–36)

## 2024-01-19 MED ORDER — AMIODARONE HCL IN DEXTROSE 360-4.14 MG/200ML-% IV SOLN
30.0000 mg/h | INTRAVENOUS | Status: AC
  Administered 2024-01-19 – 2024-01-23 (×8): 30 mg/h via INTRAVENOUS
  Filled 2024-01-19 (×7): qty 200

## 2024-01-19 MED ORDER — METOPROLOL TARTRATE 25 MG PO TABS
12.5000 mg | ORAL_TABLET | Freq: Two times a day (BID) | ORAL | Status: DC
  Administered 2024-01-19: 12.5 mg via ORAL
  Filled 2024-01-19: qty 1

## 2024-01-19 MED ORDER — HEPARIN (PORCINE) 25000 UT/250ML-% IV SOLN
1100.0000 [IU]/h | INTRAVENOUS | Status: DC
  Administered 2024-01-19: 950 [IU]/h via INTRAVENOUS
  Filled 2024-01-19: qty 250

## 2024-01-19 MED ORDER — METOPROLOL TARTRATE 5 MG/5ML IV SOLN: 5.0000 mg | Freq: Four times a day (QID) | INTRAVENOUS | Status: DC | PRN

## 2024-01-19 MED ORDER — HEPARIN BOLUS VIA INFUSION
1000.0000 [IU] | Freq: Once | INTRAVENOUS | Status: AC
  Administered 2024-01-19: 1000 [IU] via INTRAVENOUS
  Filled 2024-01-19: qty 1000

## 2024-01-19 MED ORDER — HEPARIN BOLUS VIA INFUSION
4000.0000 [IU] | Freq: Once | INTRAVENOUS | Status: AC
  Administered 2024-01-19: 4000 [IU] via INTRAVENOUS
  Filled 2024-01-19: qty 4000

## 2024-01-19 MED ORDER — AMIODARONE LOAD VIA INFUSION
150.0000 mg | Freq: Once | INTRAVENOUS | Status: AC
  Administered 2024-01-19: 150 mg via INTRAVENOUS
  Filled 2024-01-19: qty 83.34

## 2024-01-19 MED ORDER — AMIODARONE HCL IN DEXTROSE 360-4.14 MG/200ML-% IV SOLN
60.0000 mg/h | INTRAVENOUS | Status: AC
  Administered 2024-01-19 (×2): 60 mg/h via INTRAVENOUS
  Filled 2024-01-19 (×3): qty 200

## 2024-01-19 NOTE — Progress Notes (Addendum)
 23Noticed that Pt had converted to NSR, verified with CCMD and requested a rhythm strip.   0439 Pt walked 2 laps around the nursing station, HR up to the 120's ST. No SOB reported. Pt tolerated well. Incentive spirometer provided with teach back demonstrated.  0519 HR up to 156 and Pt went into A-fib since that time. Drip increased to 10mg /hr @0538 .

## 2024-01-19 NOTE — Progress Notes (Signed)
  Progress Note   Patient: April Solis FMW:[id number] DOB: 1955/06/14 DOA: 01/16/2024     2 DOS: the patient was seen and examined on 01/19/2024   Brief hospital course: Ms. Theda Payer is a 68 year old female with history of hypertension, hyperlipidemia, paroxysmal atrial fibrillation on Eliquis , non-insulin-dependent diabetes mellitus, who presents ED for chief concerns of epigastric pain and centralized chest pain and dizziness. CT scan showed gallstone, patient has biliary colic. Patient was started started on Zosyn, cholecystectomy was performed on 10/15, gallbladder appeared inflated. Postoperatively, patient converted to atrial fibrillation with RVR.  Started on diltiazem. Atrial fibrillation did not convert on 10/16, switched to amiodarone and heparin, cardiology consult for possible cardioversion    Principal Problem:   Biliary colic Active Problems:   Hypertension   Diabetes mellitus (HCC)   Hyperlipidemia associated with type 2 diabetes mellitus (HCC)   Paroxysmal atrial fibrillation with rapid ventricular response (HCC)   Calculus of gallbladder without cholecystitis without obstruction   Assessment and Plan: Paroxysmal atrial fibrillation with rapid ventricular response (HCC) Patient developed significant tachycardia after surgery, heart rate went up to 130s, diltiazem drip was started. Patient continued to have atrial fibrillation on 10/16, changed to amiodarone and heparin.  Consult cardiology for possible cardioversion if does not convert spontaneously.  IV metoprolol  as needed.  * Biliary colic Acute cholecystitis ruled in Patient is status post cholecystectomy, gallbladder appeared to be inflated during surgery. Patient has a confirmed cholecystitis. Per general surgery, patient need to complete 3 doses of Zosyn, then discontinue. Patient doing well, no significant abdominal pain or nausea vomiting.   Hyperlipidemia associated with type 2 diabetes  mellitus (HCC) Rosuvastatin  10 mg daily   Diabetes mellitus (HCC) Continue sliding scale insulin.   Hypertension Continue metoprolol .  hold off lisinopril  to allow more medicine for heart rate control.        Subjective:  Feels well today, no significant abdominal pain or nausea vomiting.  Physical Exam: Vitals:   01/19/24 0826 01/19/24 0856 01/19/24 1000 01/19/24 1103  BP: 113/71 113/71  94/64  Pulse: (!) 113 (!) 124  99  Resp:   (!) 29   Temp: 98.3 F (36.8 C)   99 F (37.2 C)  TempSrc: Oral   Oral  SpO2: 94%   95%  Weight:      Height:       General exam: Appears calm and comfortable  Respiratory system: Clear to auscultation. Respiratory effort normal. Cardiovascular system: Irregularly irregular and tachycardic.  No JVD, murmurs, rubs, gallops or clicks. No pedal edema. Gastrointestinal system: Abdomen is nondistended, soft and nontender. No organomegaly or masses felt. Normal bowel sounds heard. Central nervous system: Alert and oriented. No focal neurological deficits. Extremities: Symmetric 5 x 5 power. Skin: No rashes, lesions or ulcers Psychiatry: Judgement and insight appear normal. Mood & affect appropriate.    Data Reviewed:  Lab results reviewed  Family Communication: None  Disposition: Status is: Inpatient Remains inpatient appropriate because: Severity of disease, IV treatment.     Time spent: 35 minutes  Author: Murvin Mana, MD 01/19/2024 1:45 PM  For on call review [email or web address].

## 2024-01-19 NOTE — Care Management Important Message (Signed)
 Important Message  Patient Details  Name: April Solis MRN: [id number] Date of Birth: [birth year]   Important Message Given:  Yes - Medicare IM     Rojelio SHAUNNA Rattler 01/19/2024, 12:13 PM

## 2024-01-19 NOTE — Consult Note (Signed)
 PHARMACY - ANTICOAGULATION CONSULT NOTE  Pharmacy Consult for Heparin Indication: atrial fibrillation  Allergies  Allergen Reactions   Influenza Vaccines Other (See Comments)    Flu symptoms two hours after injections   Rubbing Alcohol [Alcohol]    Other Rash    Poison ivy    Patient Measurements: Height: 5' 1 (154.9 cm) Weight: 74.8 kg (165 lb) IBW/kg (Calculated) : 47.8 HEPARIN DW (KG): 64.3  Vital Signs: Temp: 99 F (37.2 C) (10/16 1103) Temp Source: Oral (10/16 1103) BP: 94/64 (10/16 1103) Pulse Rate: 99 (10/16 1103)  Labs: Recent Labs    01/16/24 2146 01/17/24 0000 01/18/24 0556  HGB 13.8  --  14.6  HCT 41.6  --  43.2  PLT 200  --  181  CREATININE 0.96  --  0.96  TROPONINIHS 5 4  --     Estimated Creatinine Clearance: 51.9 mL/min (by C-G formula based on SCr of 0.96 mg/dL).   Medical History: Past Medical History:  Diagnosis Date   Achilles rupture, right    Contact dermatitis and eczema due to plant    Diabetes mellitus without complication (HCC)    Environmental allergies    Glaucoma    History of chicken pox    Hyperlipidemia    Hypertension    Osteopenia    Paroxysmal A-fib (HCC)     Medications:  Apixaban  5mg  twice daily. Last dose: unknown.  Assessment: 68 year old female with history of hypertension, hyperlipidemia, paroxysmal atrial fibrillation on Eliquis , non-insulin-dependent diabetes mellitus, who presents ED for chief concerns of epigastric pain and centralized chest pain and dizziness. Patient unsure of when she last took her apixaban  dose, but has been greater than 72 hours ago.  S/p laparoscopic cholecystectomy on 10/15, patient developed afib w/RVR that then converted back to normal sinus rhythm with a cardizem continuous infusion but then came back into RVR overnight. Pharmacy has been consulted to initiate a continuous heparin infusion while apixaban  is being held for possible procedures.  Baseline labs: aPTT 42 sec, INR 1.5, HL  0.34, Hgb 14.6, Plts 181  Goal of Therapy:  Heparin level 0.3-0.7 units/ml aPTT 66-102 seconds Monitor platelets by anticoagulation protocol: Yes   Plan:  Give 4000 units bolus x 1 Start heparin infusion at 950 units/hr Since baseline heparin level and aPTT are not correlating, will use aPTT to guide dosing for now Check aPTT in 6 hours after initiation and anti-Xa level daily Will transition to anti-Xa level dosing once both levels correlate Continue to monitor H&H and platelets  Elian Gloster A Daleiza Bacchi 01/19/2024,1:31 PM

## 2024-01-19 NOTE — Consult Note (Signed)
 Cardiology Consultation   [name] MRN: [id number]; DOB: 08/28/1955  Admit date: 01/16/2024 Date of Consult: 01/19/2024  PCP:  Glendia Shad, MD   [facility] Providers Cardiologist:  Lonni Hanson, MD  Physician requesting consult: Dr. Laurita Reason for consult: Atrial fibrillation with RVR  Patient Profile: April Solis is a 68 y.o. female with a hx of paroxysmal atrial fibrillation, diabetes, hypertension, hyperlipidemia presenting to the ER January 16, 2024 with pain right upper quadrant extending to her back, status post cholecystectomy, postoperative atrial fibrillation   History of Present Illness: Ms. April Solis presented January 16, 2024 with chest, right upper quadrant pain extending through to her back Cardiac enzymes negative, CTA chest abdomen detailing cholelithiasis without evidence of obstruction, 3 cm gallstone within the gallbladder Seen by general surgery, Eliquis  held Underwent laparoscopic cholecystectomy October 15 Post procedure October 15 in the morning converting to atrial fibrillation started on diltiazem infusion Converted back to normal sinus rhythm per nursing notes with recurrence of her atrial fibrillation 5 AM Diltiazem infusion increased 10 mg/h Given poor rate control and hypotension, started on amiodarone bolus and infusion On rounds 3 PM with heart rate 110 bpm No recent blood pressure available since 11 AM Sitting up in chair, asymptomatic Denies chest pain or shortness of breath Reports mild abdominal distention  Past Medical History:  Diagnosis Date   Achilles rupture, right    Contact dermatitis and eczema due to plant    Diabetes mellitus without complication (HCC)    Environmental allergies    Glaucoma    History of chicken pox    Hyperlipidemia    Hypertension    Osteopenia    Paroxysmal A-fib (HCC)     Past Surgical History:  Procedure Laterality Date   ACHILLES TENDON  REPAIR Right    BREAST BIOPSY Right 1999   neg   COLONOSCOPY WITH PROPOFOL  N/A 12/12/2017   Procedure: COLONOSCOPY WITH PROPOFOL ;  Surgeon: Viktoria Lamar DASEN, MD;  Location: [facility] ENDOSCOPY;  Service: Endoscopy;  Laterality: N/A;   COLONOSCOPY WITH PROPOFOL  N/A 06/24/2023   Procedure: COLONOSCOPY WITH PROPOFOL ;  Surgeon: Onita Elspeth Sharper, DO;  Location: [facility] ENDOSCOPY;  Service: Gastroenterology;  Laterality: N/A;  DM, 1ST CASE, PLEASE   TUBAL LIGATION       Home Medications:  Prior to Admission medications   Medication Sig Start Date End Date Taking? Authorizing Provider  amLODipine  (NORVASC ) 5 MG tablet Take 1 tablet (5 mg total) by mouth daily. 07/19/23   Glendia Shad, MD  apixaban  (ELIQUIS ) 5 MG TABS tablet Take 1 tablet (5 mg total) by mouth 2 (two) times daily. 07/19/23   Glendia Shad, MD  dorzolamide-timolol (COSOPT) 22.3-6.8 MG/ML ophthalmic solution Place 1 drop into both eyes 2 (two) times daily. 07/05/14   [provider]  doxycycline  (ADOXA) 50 MG tablet Take 1-2 tabs PO QD PRN flares 04/11/23   Jackquline Sawyer, MD  empagliflozin  (JARDIANCE ) 25 MG TABS tablet Take 1 tablet (25 mg total) by mouth daily. 07/19/23   Glendia Shad, MD  finasteride  (PROSCAR ) 5 MG tablet Take 1 tablet (5 mg total) by mouth daily. 08/02/23   Jackquline Sawyer, MD  Fluocinolone  Acetonide Scalp 0.01 % OIL Apply to aa's scalp QD PRN flares. 10/13/21   Jackquline Sawyer, MD  glipiZIDE  (GLUCOTROL  XL) 2.5 MG 24 hr tablet Take 1 tablet (2.5 mg total) by mouth daily with breakfast. 07/19/23   Glendia Shad, MD  glucose blood (GE100 BLOOD GLUCOSE TEST) test strip TEST TWICE DAILY  07/19/23   Glendia Shad, MD  ketoconazole  (NIZORAL ) 2 % shampoo Shampoo into the scalp let sit several minutes then wash out. Use 2-3 days per week. 10/13/21   Jackquline Sawyer, MD  lisinopril  (ZESTRIL ) 10 MG tablet Take 1 tablet (10 mg total) by mouth daily. 07/19/23   Glendia Shad, MD  metFORMIN  (GLUCOPHAGE -XR) 500 MG 24 hr tablet  TAKE TWO TABLETS BY MOUTH EVERY MORNING AND AT BEDTIME 10/14/23   Glendia Shad, MD  metoprolol  tartrate (LOPRESSOR ) 25 MG tablet Take 1 tablet (25 mg total) by mouth 2 (two) times daily. 07/19/23   Glendia Shad, MD  minoxidil  (LONITEN ) 2.5 MG tablet Take 1 tablet (2.5 mg total) by mouth daily. 08/02/23   Jackquline Sawyer, MD  Multiple Vitamin (MULTIVITAMIN) tablet Take 1 tablet by mouth daily.    [provider]  mupirocin  ointment (BACTROBAN ) 2 % Apply 1 Application topically 2 (two) times daily. Bid to right forehead 08/08/23   Jackquline Sawyer, MD  nystatin  cream (MYCOSTATIN ) Apply 1 Application topically 2 (two) times daily. 01/07/23   Glendia Shad, MD  rosuvastatin  (CRESTOR ) 10 MG tablet TAKE ONE TABLET EVERY DAY 10/14/23   Glendia Shad, MD  timolol (BETIMOL) 0.25 % ophthalmic solution Place 1-2 drops into both eyes daily.    [provider]  triamcinolone  cream (KENALOG ) 0.1 % APPLY TO THE AFFECTED AREA DAILY AS NEEDED 01/21/20   Glendia Shad, MD  vitamin E 200 UNIT capsule Take 200 Units by mouth daily.    [provider]    Scheduled Meds:  insulin aspart  0-15 Units Subcutaneous TID WC   insulin aspart  0-5 Units Subcutaneous QHS   metoprolol  tartrate  25 mg Oral BID   rosuvastatin   10 mg Oral Daily   Continuous Infusions:  amiodarone 60 mg/hr (01/19/24 1300)   Followed by   amiodarone     heparin 950 Units/hr (01/19/24 1506)   lactated ringers 75 mL/hr at 01/19/24 0450   piperacillin-tazobactam (ZOSYN)  IV 3.375 g (01/19/24 0856)   promethazine (PHENERGAN) injection (IM or IVPB)     PRN Meds: acetaminophen **OR** acetaminophen, hydrALAZINE, metoprolol  tartrate, ondansetron **OR** ondansetron (ZOFRAN) IV, promethazine (PHENERGAN) injection (IM or IVPB)  Allergies:    Allergies  Allergen Reactions   Influenza Vaccines Other (See Comments)    Flu symptoms two hours after injections   Rubbing Alcohol [Alcohol]    Other Rash    Poison ivy     Social History:   Social History   Socioeconomic History   Marital status: Married    Spouse name: Not on file   Number of children: 3   Years of education: Not on file   Highest education level: Some college, no degree  Occupational History    Employer: [employer]  Tobacco Use   Smoking status: Never   Smokeless tobacco: Never  Vaping Use   Vaping status: Never Used  Substance and Sexual Activity   Alcohol use: No    Alcohol/week: 0.0 standard drinks of alcohol   Drug use: No   Sexual activity: Not on file  Other Topics Concern   Not on file  Social History Narrative   Married   Social Drivers of Health   Financial Resource Strain: Low Risk  (11/21/2023)   Overall Financial Resource Strain (CARDIA)    Difficulty of Paying Living Expenses: Not hard at all  Food Insecurity: No Food Insecurity (01/17/2024)   Hunger Vital Sign    Worried About Running Out of Food in the  Last Year: Never true    Ran Out of Food in the Last Year: Never true  Transportation Needs: No Transportation Needs (01/17/2024)   PRAPARE - Administrator, Civil Service (Medical): No    Lack of Transportation (Non-Medical): No  Physical Activity: Insufficiently Active (04/11/2023)   Exercise Vital Sign    Days of Exercise per Week: 4 days    Minutes of Exercise per Session: 30 min  Stress: No Stress Concern Present (11/21/2023)   [employer] of Occupational Health - Occupational Stress Questionnaire    Feeling of Stress: Not at all  Social Connections: Socially Integrated (01/17/2024)   Social Connection and Isolation Panel    Frequency of Communication with Friends and Family: More than three times a week    Frequency of Social Gatherings with Friends and Family: More than three times a week    Attends Religious Services: More than 4 times per year    Active Member of [employer] or Organizations: Yes    Attends Engineer, structural: More than 4 times per year    Marital Status:  Married  Catering manager Violence: Not At Risk (01/17/2024)   Humiliation, Afraid, Rape, and Kick questionnaire    Fear of Current or Ex-Partner: No    Emotionally Abused: No    Physically Abused: No    Sexually Abused: No    Family History:    Family History  Problem Relation Age of Onset   Colon polyps Mother    Alzheimer's disease Mother    Heart attack Father 29   Hypertension Father    Diabetes Father    Hypertension Sister    Diabetes Sister        x2   COPD Sister    Diabetes Sister    Hypertension Sister    Glaucoma Sister    Arrhythmia Son        Faster heart beat than normal   Breast cancer Neg Hx    Colon cancer Neg Hx      ROS:  Please see the history of present illness.  Review of Systems  Constitutional: Negative.   HENT: Negative.    Respiratory: Negative.    Cardiovascular: Negative.   Gastrointestinal: Negative.   Musculoskeletal: Negative.   Neurological: Negative.   Psychiatric/Behavioral: Negative.    All other systems reviewed and are negative.   Physical Exam/Data: Vitals:   01/19/24 0826 01/19/24 0856 01/19/24 1000 01/19/24 1103  BP: 113/71 113/71  94/64  Pulse: (!) 113 (!) 124  99  Resp:   (!) 29   Temp: 98.3 F (36.8 C)   99 F (37.2 C)  TempSrc: Oral   Oral  SpO2: 94%   95%  Weight:      Height:        Intake/Output Summary (Last 24 hours) at 01/19/2024 1521 Last data filed at 01/19/2024 1020 Gross per 24 hour  Intake 445.66 ml  Output --  Net 445.66 ml      01/16/2024    9:45 PM 11/22/2023    8:27 AM 07/19/2023    8:37 AM  Last 3 Weights  Weight (lbs) 165 lb 168 lb 9.6 oz 166 lb 6.4 oz  Weight (kg) 74.844 kg 76.476 kg 75.479 kg     Body mass index is 31.18 kg/m.  General:  Well nourished, well developed, in no acute distress HEENT: normal Neck: no JVD Vascular: No carotid bruits; Distal pulses 2+ bilaterally Cardiac: Irregularly irregular no murmur  Lungs:  clear to auscultation bilaterally, no wheezing,  rhonchi or rales  Abd: soft, nontender, no hepatomegaly  Ext: no edema Musculoskeletal:  No deformities, BUE and BLE strength normal and equal Skin: warm and dry  Neuro:  CNs 2-12 intact, no focal abnormalities noted Psych:  Normal affect   EKG:  The EKG was personally reviewed and demonstrates:   EKG January 16, 2024 normal sinus rhythm rate 59 bpm  Telemetry:  Telemetry was personally reviewed and demonstrates: Atrial fibrillation rate 110 bpm  Relevant CV Studies: Echocardiogram November 2022 EF 60 to 65%  Laboratory Data: High Sensitivity Troponin:   Recent Labs  Lab 01/16/24 2146 01/17/24 0000  TROPONINIHS 5 4     Chemistry Recent Labs  Lab 01/16/24 2146 01/18/24 0556  NA 136 135  K 4.0 3.7  CL 100 99  CO2 24 24  GLUCOSE 162* 161*  BUN 22 11  CREATININE 0.96 0.96  CALCIUM  9.3 8.6*  GFRNONAA >60 >60  ANIONGAP 12 12    Recent Labs  Lab 01/17/24 0000  PROT 7.4  ALBUMIN 4.4  AST 25  ALT 25  ALKPHOS 52  BILITOT 0.4   Lipids No results for input(s): CHOL, TRIG, HDL, LABVLDL, LDLCALC, CHOLHDL in the last 168 hours.  Hematology Recent Labs  Lab 01/16/24 2146 01/18/24 0556  WBC 7.4 16.8*  RBC 4.55 4.79  HGB 13.8 14.6  HCT 41.6 43.2  MCV 91.4 90.2  MCH 30.3 30.5  MCHC 33.2 33.8  RDW 12.8 13.1  PLT 200 181   Thyroid  No results for input(s): TSH, FREET4 in the last 168 hours.  BNPNo results for input(s): BNP, PROBNP in the last 168 hours.  DDimer No results for input(s): DDIMER in the last 168 hours.  Radiology/Studies:  CT Angio Chest/Abd/Pel for Dissection W and/or Wo Contrast Result Date: 01/17/2024 CLINICAL DATA:  Acute aortic syndrome (AAS) suspected.  Chest pain EXAM: CT ANGIOGRAPHY CHEST, ABDOMEN AND PELVIS TECHNIQUE: Non-contrast CT of the chest was initially obtained. Multidetector CT imaging through the chest, abdomen and pelvis was performed using the standard protocol during bolus administration of intravenous  contrast. Multiplanar reconstructed images and MIPs were obtained and reviewed to evaluate the vascular anatomy. RADIATION DOSE REDUCTION: This exam was performed according to the departmental dose-optimization program which includes automated exposure control, adjustment of the mA and/or kV according to patient size and/or use of iterative reconstruction technique. CONTRAST:  OMNIPAQUE  IOHEXOL  350 MG/ML SOLN COMPARISON:  05/28/2021 FINDINGS: CTA CHEST FINDINGS Cardiovascular: Heart is normal size. Aorta is normal caliber. No dissection. No filling defects in the pulmonary arteries to suggest pulmonary emboli. Mediastinum/Nodes: No mediastinal, hilar, or axillary adenopathy. Trachea and esophagus are unremarkable. Substernal left thyroid  nodule measures up to 2.3 cm. Lungs/Pleura: Lungs are clear. No focal airspace opacities or suspicious nodules. No effusions. No pneumothorax. Musculoskeletal: Chest wall soft tissues are unremarkable. No acute bony abnormality. Review of the MIP images confirms the above findings. CTA ABDOMEN AND PELVIS FINDINGS VASCULAR Aorta: Normal caliber aorta without aneurysm, dissection, vasculitis or significant stenosis. Celiac: Patent without evidence of aneurysm, dissection, vasculitis or significant stenosis. SMA: Patent without evidence of aneurysm, dissection, vasculitis or significant stenosis. Renals: Both renal arteries are patent without evidence of aneurysm, dissection, vasculitis, fibromuscular dysplasia or significant stenosis. IMA: Patent without evidence of aneurysm, dissection, vasculitis or significant stenosis. Inflow: Patent without evidence of aneurysm, dissection, vasculitis or significant stenosis. Veins: No obvious venous abnormality within the limitations of this arterial phase study. Review of the MIP images  confirms the above findings. NON-VASCULAR Hepatobiliary: 3 cm gallstone within the gallbladder. No CT evidence of acute cholecystitis. Mild low-density  throughout the liver compatible with fatty infiltration. No focal hepatic abnormality. Pancreas: No focal abnormality or ductal dilatation. Spleen: No focal abnormality.  Normal size. Adrenals/Urinary Tract: No adrenal abnormality. No focal renal abnormality. No stones or hydronephrosis. Urinary bladder is unremarkable. Stomach/Bowel: Normal appendix. Scattered colonic diverticula. No active diverticulitis. Stomach and small bowel decompressed. Lymphatic: No adenopathy Reproductive: Uterus and adnexa unremarkable.  No mass. Other: No free fluid or free air. Musculoskeletal: No acute bony abnormality. Review of the MIP images confirms the above findings. IMPRESSION: No evidence of aortic aneurysm or dissection. No evidence of pulmonary embolus. No acute findings in the chest, abdomen or pelvis. Cholelithiasis. Scattered colonic diverticulosis. Electronically Signed   By: Franky Crease M.D.   On: 01/17/2024 01:33   DG Chest 2 View Result Date: 01/16/2024 EXAM: 2 VIEW(S) XRAY OF THE CHEST 01/16/2024 10:14:30 PM COMPARISON: None available. CLINICAL HISTORY: cp. PER ER NOTE; Pt reports centralized chest pain that began tonight around 2000, pt reports hx afib. Pt is on blood thinner and metoprolol . Pt also reports some dizziness. FINDINGS: LUNGS AND PLEURA: No focal pulmonary opacity. No pulmonary edema. No pleural effusion. No pneumothorax. HEART AND MEDIASTINUM: No acute abnormality of the cardiac and mediastinal silhouettes. BONES AND SOFT TISSUES: Degenerative changes of the thoracic spine with mild dextrocurvature. IMPRESSION: 1. No acute cardiopulmonary process. Electronically signed by: Pinkie Pebbles MD 01/16/2024 10:21 PM EDT RP Workstation: [id number]     Assessment and Plan: Paroxysmal atrial fibrillation Postop arrhythmia following cholecystectomy Difficult to control rate and rhythm with diltiazem infusion -Heparin infusion - Currently on amiodarone infusion - No recent blood pressure numbers  available - Consider adding low-dose metoprolol   tartrate 25 twice daily as blood pressure tolerates (outpatient dose) - No plans for cardioversion, high risk of recurrent arrhythmia in the postoperative period - Eliquis  5 twice daily at discharge as she takes outpatient  2.  Cholecystitis, status postcholecystectomy Surgery October 13 WBC yesterday 16.8 up from 7.4 No recent fevers on exam, mild discomfort in abdomen Advancing diet Surgery following  3.  Diabetes type 2 A1c 6.7 in August 2025 On metformin , Jardiance  as outpatient  4.  Essential hypertension Amlodipine , metoprolol  currently on hold  5.  Hyperlipidemia As outpatient takes Crestor  10  For questions or updates, please contact [facility] Please consult [email or web address] for contact info under     Signed, Bo Teicher, MD  01/19/2024 3:21 PM

## 2024-01-19 NOTE — Consult Note (Signed)
 PHARMACY - ANTICOAGULATION CONSULT NOTE  Pharmacy Consult for Heparin Indication: atrial fibrillation  Allergies  Allergen Reactions   Influenza Vaccines Other (See Comments)    Flu symptoms two hours after injections   Rubbing Alcohol [Alcohol]    Other Rash    Poison ivy    Patient Measurements: Height: 5' 1 (154.9 cm) Weight: 74.8 kg (165 lb) IBW/kg (Calculated) : 47.8 HEPARIN DW (KG): 64.3  Vital Signs: Temp: 98.7 F (37.1 C) (10/16 2057) Temp Source: Oral (10/16 2057) BP: 105/77 (10/16 2057) Pulse Rate: 112 (10/16 2057)  Labs: Recent Labs    01/17/24 0000 01/18/24 0556 01/19/24 1323 01/19/24 2200  HGB  --  14.6  --   --   HCT  --  43.2  --   --   PLT  --  181  --   --   APTT  --   --  42* 59*  LABPROT  --   --  18.6*  --   INR  --   --  1.5*  --   HEPARINUNFRC  --   --  0.34  --   CREATININE  --  0.96  --   --   TROPONINIHS 4  --   --   --     Estimated Creatinine Clearance: 51.9 mL/min (by C-G formula based on SCr of 0.96 mg/dL).   Medical History: Past Medical History:  Diagnosis Date   Achilles rupture, right    Contact dermatitis and eczema due to plant    Diabetes mellitus without complication (HCC)    Environmental allergies    Glaucoma    History of chicken pox    Hyperlipidemia    Hypertension    Osteopenia    Paroxysmal A-fib (HCC)     Medications:  Apixaban  5mg  twice daily. Last dose: unknown.  Assessment: 68 year old female with history of hypertension, hyperlipidemia, paroxysmal atrial fibrillation on Eliquis , non-insulin-dependent diabetes mellitus, who presents ED for chief concerns of epigastric pain and centralized chest pain and dizziness. Patient unsure of when she last took her apixaban  dose, but has been greater than 72 hours ago.  S/p laparoscopic cholecystectomy on 10/15, patient developed afib w/RVR that then converted back to normal sinus rhythm with a cardizem continuous infusion but then came back into RVR overnight.  Pharmacy has been consulted to initiate a continuous heparin infusion while apixaban  is being held for possible procedures.  Baseline labs: aPTT 42 sec, INR 1.5, HL 0.34, Hgb 14.6, Plts 181  Goal of Therapy:  Heparin level 0.3-0.7 units/ml aPTT 66-102 seconds Monitor platelets by anticoagulation protocol: Yes  10/16 2200 aPTT 59, subtherapeutic    Plan:  Give 1000 units bolus x 1 Increase heparin infusion to 1100 units/hr Since baseline heparin level and aPTT are not correlating, will use aPTT to guide dosing for now Recheck aPTT w/ AM labs after rate change Recheck HL daily Will transition to anti-Xa level dosing once both levels correlate Continue to monitor H&H and platelets  Rankin CANDIE Dills, PharmD, [facility] 01/19/2024 10:45 PM

## 2024-01-19 NOTE — Progress Notes (Signed)
 [facility] SUR[facility]ICAL PROGRESS NOTE  Hospital Day(s): 2.   Post op day(s): 1 Day Post-Op.   Interval History:  Patient seen and examined Overnight, converted back to afib RVR on Cardizem Patient reports she is feeling better Abdomen is sore No fever, chills, nausea, emesis No new labs this AM CLD; no issues  Vital signs in last 24 hours: [min-max] current  Temp:  [98 F (36.7 C)-99.4 F (37.4 C)] 98.7 F (37.1 C) (10/16 0613) Pulse Rate:  [50-132] 86 (10/16 0339) Resp:  [14-29] 23 (10/16 0613) BP: (95-139)/(58-88) 103/60 (10/16 0613) SpO2:  [90 %-96 %] 94 % (10/16 0613)     Height: 5' 1 (154.9 cm) Weight: 74.8 kg BMI (Calculated): 31.19   Intake/Output last 2 shifts:  10/15 0701 - 10/16 0700 In: 1505.7 [I.V.:1371.2; IV Piggyback:134.5] Out: -    Physical Exam:  Constitutional: alert, cooperative and no distress  Respiratory: breathing non-labored at rest  Cardiovascular: irregularly irregular Gastrointestinal: soft, incisional soreness, and non-distended Integumentary: Laparoscopic incisions are CDI with dermabond, no erythema   Labs:     Latest Ref Rng & Units 01/18/2024    5:56 AM 01/16/2024    9:46 PM 01/04/2023    7:19 AM  CBC  WBC 4.0 - 10.5 K/uL 16.8  7.4  7.0   Hemoglobin 12.0 - 15.0 g/dL 85.3  86.1  85.3   Hematocrit 36.0 - 46.0 % 43.2  41.6  44.1   Platelets 150 - 400 K/uL 181  200  220.0       Latest Ref Rng & Units 01/18/2024    5:56 AM 01/17/2024   12:00 AM 01/16/2024    9:46 PM  CMP  Glucose 70 - 99 mg/dL 838   837   BUN 8 - 23 mg/dL 11   22   Creatinine 9.55 - 1.00 mg/dL 9.03   9.03   Sodium 864 - 145 mmol/L 135   136   Potassium 3.5 - 5.1 mmol/L 3.7   4.0   Chloride 98 - 111 mmol/L 99   100   CO2 22 - 32 mmol/L 24   24   Calcium  8.9 - 10.3 mg/dL 8.6   9.3   Total Protein 6.5 - 8.1 g/dL  7.4    Total Bilirubin 0.0 - 1.2 mg/dL  0.4    Alkaline Phos 38 - 126 U/L  52    AST 15 - 41 U/L  25    ALT 0 - 44 U/L  25        Imaging studies: No new pertinent imaging studies   Assessment/Plan:  68 y.o. female 1 Day Post-Op s/p robotic assisted laparoscopic cholecystectomy for acute cholecystitis, complicated by pertinent comorbidities including atrial fibrillation with RVR.   - Okay to advance diet as tolerated; order placed - Reviewed dietary recommendations for home  - Complete perioperative Abx x3 doses    - Monitor abdominal examination; on-going bowel function   - Pain control prn; antiemetics prn - Mobilize as tolerated - Further management per primary service     - Discharge Planning: No issues from surgical perspective. Anticipate HR will be barrier to DC. Okay to advance diet as tolerated. Will update follow up and instructions this morning.   All of the above findings and recommendations were discussed with the patient, and the medical team, and all of patient's questions were answered to her expressed satisfaction.  -- April Platt, PA-C [facility] 01/19/2024, 7:26 AM M-F: 7am - 4pm

## 2024-01-19 NOTE — Discharge Instructions (Signed)
In addition to included general post-operative instructions,  Diet: Resume home diet. Recommend avoiding or limiting fatty/greasy foods over the next few days/week. If you do eat these, you may (or may not) notice diarrhea. This is expected while your body adjusts to not having a gallbladder, and it typically resolves with time.    Activity: No heavy lifting >20 pounds (children, pets, laundry, garbage) or strenuous activity for 4 weeks, but light activity and walking are encouraged. Do not drive or drink alcohol if taking narcotic pain medications or having pain that might distract from driving.  Wound care: You may shower/get incision wet with soapy water and pat dry (do not rub incisions), but no baths or submerging incision underwater until follow-up.   Medications: Resume all home medications. For mild to moderate pain: acetaminophen (Tylenol) or ibuprofen/naproxen (if no kidney disease). Combining Tylenol with alcohol can substantially increase your risk of causing liver disease. Narcotic pain medications, if prescribed, can be used for severe pain, though may cause nausea, constipation, and drowsiness. Do not combine Tylenol and Percocet (or similar) within a 6 hour period as Percocet (and similar) contain(s) Tylenol. If you do not need the narcotic pain medication, you do not need to fill the prescription.  Call office [phone] / [phone]) at any time if any questions, worsening pain, fevers/chills, bleeding, drainage from incision site, or other concerns.

## 2024-01-20 DIAGNOSIS — I480 Paroxysmal atrial fibrillation: Secondary | ICD-10-CM | POA: Diagnosis not present

## 2024-01-20 DIAGNOSIS — I10 Essential (primary) hypertension: Secondary | ICD-10-CM | POA: Diagnosis not present

## 2024-01-20 DIAGNOSIS — E782 Mixed hyperlipidemia: Secondary | ICD-10-CM

## 2024-01-20 DIAGNOSIS — K8050 Calculus of bile duct without cholangitis or cholecystitis without obstruction: Secondary | ICD-10-CM | POA: Diagnosis not present

## 2024-01-20 DIAGNOSIS — E1165 Type 2 diabetes mellitus with hyperglycemia: Secondary | ICD-10-CM | POA: Diagnosis not present

## 2024-01-20 LAB — CBC
HCT: 38.4 % (ref 36.0–46.0)
Hemoglobin: 12.6 g/dL (ref 12.0–15.0)
MCH: 30.4 pg (ref 26.0–34.0)
MCHC: 32.8 g/dL (ref 30.0–36.0)
MCV: 92.8 fL (ref 80.0–100.0)
Platelets: 160 K/uL (ref 150–400)
RBC: 4.14 MIL/uL (ref 3.87–5.11)
RDW: 13.2 % (ref 11.5–15.5)
WBC: 10.9 K/uL — ABNORMAL HIGH (ref 4.0–10.5)
nRBC: 0 % (ref 0.0–0.2)

## 2024-01-20 LAB — APTT
aPTT: 66 s — ABNORMAL HIGH (ref 24–36)
aPTT: 37 s — ABNORMAL HIGH (ref 24–36)

## 2024-01-20 LAB — GLUCOSE, CAPILLARY
Glucose-Capillary: 172 mg/dL — ABNORMAL HIGH (ref 70–99)
Glucose-Capillary: 106 mg/dL — ABNORMAL HIGH (ref 70–99)
Glucose-Capillary: 104 mg/dL — ABNORMAL HIGH (ref 70–99)
Glucose-Capillary: 178 mg/dL — ABNORMAL HIGH (ref 70–99)

## 2024-01-20 LAB — HEPARIN LEVEL (UNFRACTIONATED): Heparin Unfractionated: 0.47 [IU]/mL (ref 0.30–0.70)

## 2024-01-20 MED ORDER — METOPROLOL TARTRATE 50 MG PO TABS
50.0000 mg | ORAL_TABLET | Freq: Two times a day (BID) | ORAL | Status: DC
  Administered 2024-01-20: 50 mg via ORAL
  Filled 2024-01-20: qty 1

## 2024-01-20 MED ORDER — APIXABAN 5 MG PO TABS
5.0000 mg | ORAL_TABLET | Freq: Two times a day (BID) | ORAL | Status: DC
  Administered 2024-01-21 – 2024-01-24 (×7): 5 mg via ORAL
  Filled 2024-01-20 (×7): qty 1

## 2024-01-20 MED ADMIN — Metoprolol Tartrate Tab 25 MG: 25 mg | ORAL | NDC 51079025501

## 2024-01-20 MED FILL — Metoprolol Tartrate Tab 25 MG: 25.0000 mg | ORAL | Qty: 1 | Status: AC

## 2024-01-20 NOTE — Progress Notes (Signed)
  Progress Note   Patient: April Solis FMW:[id number] DOB: 05/13/1955 DOA: 01/16/2024     3 DOS: the patient was seen and examined on 01/20/2024   Brief hospital course: Ms. April Solis is a 68 year old female with history of hypertension, hyperlipidemia, paroxysmal atrial fibrillation on Eliquis , non-insulin-dependent diabetes mellitus, who presents ED for chief concerns of epigastric pain and centralized chest pain and dizziness. CT scan showed gallstone, patient has biliary colic. Patient was started started on Zosyn, cholecystectomy was performed on 10/15, gallbladder appeared inflated. Postoperatively, patient converted to atrial fibrillation with RVR.  Started on diltiazem. Atrial fibrillation did not convert on 10/16, switched to amiodarone and heparin, cardiology consult for possible cardioversion    Principal Problem:   Biliary colic Active Problems:   Hypertension   Diabetes mellitus (HCC)   Hyperlipidemia associated with type 2 diabetes mellitus (HCC)   Paroxysmal atrial fibrillation with rapid ventricular response (HCC)   Nonspecific chest pain   Calculus of gallbladder without cholecystitis without obstruction   Mixed hyperlipidemia   Assessment and Plan: Paroxysmal atrial fibrillation with rapid ventricular response (HCC) Patient developed significant tachycardia after surgery, heart rate went up to 130s, diltiazem drip was started. Patient continued to have atrial fibrillation on 10/16, changed to amiodarone and heparin.  Per cardiology, no plan for cardioversion, heparin discontinued.  Discussed with general surgery, Eliquis  can be started tomorrow morning. Heart rate still fast, increased Toprol  to 50 mg twice a day.  Continue amiodarone drip.   * Biliary colic Acute cholecystitis ruled in Patient is status post cholecystectomy, gallbladder appeared to be inflated during surgery. Patient has a confirmed cholecystitis. Per general surgery, patient  need to complete 3 doses of Zosyn, then discontinue.  No additional need for Zosyn. Patient doing well, no significant abdominal pain or nausea vomiting.   Hyperlipidemia associated with type 2 diabetes mellitus (HCC) Rosuvastatin  10 mg daily   Diabetes mellitus (HCC) Continue sliding scale insulin.   Hypertension Continue metoprolol .  hold off lisinopril  to allow more medicine for heart rate control.        Subjective:  Patient feels well today, no short of breath, no palpitation.  Physical Exam: Vitals:   01/20/24 0054 01/20/24 0409 01/20/24 0841 01/20/24 1305  BP: 110/66 118/76 116/76 125/66  Pulse: 88 72 72   Resp: 17 18 18 15   Temp: 98.6 F (37 C) 99 F (37.2 C) 98.5 F (36.9 C) 98.4 F (36.9 C)  TempSrc: Oral Oral    SpO2: 96% 96% 97% 94%  Weight:      Height:       General exam: Appears calm and comfortable  Respiratory system: Clear to auscultation. Respiratory effort normal. Cardiovascular system: Irregularly irregular and tachycardic. No JVD, murmurs, rubs, gallops or clicks. No pedal edema. Gastrointestinal system: Abdomen is nondistended, soft and nontender. No organomegaly or masses felt. Normal bowel sounds heard. Central nervous system: Alert and oriented. No focal neurological deficits. Extremities: Symmetric 5 x 5 power. Skin: No rashes, lesions or ulcers Psychiatry: Judgement and insight appear normal. Mood & affect appropriate.    Data Reviewed:  Lab results reviewed.  Family Communication: None  Disposition: Status is: Inpatient Remains inpatient appropriate because: Severity of disease, IV treatment. Plan discharge with home health when heart rate better controlled.     Time spent: 35 minutes  Author: Murvin Mana, MD 01/20/2024 2:01 PM  For on call review [email or web address].

## 2024-01-20 NOTE — Consult Note (Signed)
 PHARMACY - ANTICOAGULATION CONSULT NOTE  Pharmacy Consult for Heparin Indication: atrial fibrillation  Allergies  Allergen Reactions   Influenza Vaccines Other (See Comments)    Flu symptoms two hours after injections   Rubbing Alcohol [Alcohol]    Other Rash    Poison ivy    Patient Measurements: Height: 5' 1 (154.9 cm) Weight: 74.8 kg (165 lb) IBW/kg (Calculated) : 47.8 HEPARIN DW (KG): 64.3  Vital Signs: Temp: 99 F (37.2 C) (10/17 0409) Temp Source: Oral (10/17 0409) BP: 118/76 (10/17 0409) Pulse Rate: 72 (10/17 0409)  Labs: Recent Labs    01/18/24 0556 01/19/24 1323 01/19/24 2200 01/20/24 0438  HGB 14.6  --   --  12.6  HCT 43.2  --   --  38.4  PLT 181  --   --  160  APTT  --  42* 59* 66*  LABPROT  --  18.6*  --   --   INR  --  1.5*  --   --   HEPARINUNFRC  --  0.34  --  0.47  CREATININE 0.96  --   --   --     Estimated Creatinine Clearance: 51.9 mL/min (by C-G formula based on SCr of 0.96 mg/dL).   Medical History: Past Medical History:  Diagnosis Date   Achilles rupture, right    Contact dermatitis and eczema due to plant    Diabetes mellitus without complication (HCC)    Environmental allergies    Glaucoma    History of chicken pox    Hyperlipidemia    Hypertension    Osteopenia    Paroxysmal A-fib (HCC)     Medications:  Apixaban  5mg  twice daily. Last dose: unknown.  Assessment: 68 year old female with history of hypertension, hyperlipidemia, paroxysmal atrial fibrillation on Eliquis , non-insulin-dependent diabetes mellitus, who presents ED for chief concerns of epigastric pain and centralized chest pain and dizziness. Patient unsure of when she last took her apixaban  dose, but has been greater than 72 hours ago.  S/p laparoscopic cholecystectomy on 10/15, patient developed afib w/RVR that then converted back to normal sinus rhythm with a cardizem continuous infusion but then came back into RVR overnight. Pharmacy has been consulted to  initiate a continuous heparin infusion while apixaban  is being held for possible procedures.  Baseline labs: aPTT 42 sec, INR 1.5, HL 0.34, Hgb 14.6, Plts 181  Goal of Therapy:  Heparin level 0.3-0.7 units/ml aPTT 66-102 seconds Monitor platelets by anticoagulation protocol: Yes  10/16 2200 aPTT 59, subtherapeutic 10/17 0438 aPTT 66, therapeutic x 1 / HL 0.47   Plan:  Continue heparin infusion at 1100 units/hr Since baseline heparin level and aPTT are not correlating, will use aPTT to guide dosing for now Recheck aPTT in 6 hrs to confirm Recheck HL daily Will transition to anti-Xa level dosing once both levels correlate Continue to monitor H&H and platelets  Rankin CANDIE Dills, PharmD, [facility] 01/20/2024 5:34 AM

## 2024-01-20 NOTE — Progress Notes (Signed)
 Patient postop day 2 from robotic assisted cholecystectomy.  She reports doing well.  She is tolerating regular diet.  She still is on amiodarone drip.  From surgical perspective she is doing well.  Her incisions are clean dry and intact with no erythema.  She is tolerating a regular diet.  Her pain is controlled.  It is okay to restart Eliquis  tomorrow.  She should follow-up with our office in 2 weeks.  Surgery team to sign off for now.

## 2024-01-20 NOTE — Progress Notes (Signed)
 Progress Note  Patient Name: April Solis Date of Encounter: 01/20/2024  Primary Cardiologist: End  Subjective   No chest pain, dyspnea, dizziness, presyncope, or syncope.  She does notice some palpitations postoperatively.  She remains in A-fib with ventricular rates in the low 100s to 120s to 130s bpm.  Remains on amiodarone and heparin drips.  BP stable.  Inpatient Medications    Scheduled Meds:  insulin aspart  0-15 Units Subcutaneous TID WC   insulin aspart  0-5 Units Subcutaneous QHS   metoprolol  tartrate  25 mg Oral BID   rosuvastatin   10 mg Oral Daily   Continuous Infusions:  amiodarone 30 mg/hr (01/20/24 0112)   heparin 1,100 Units/hr (01/19/24 2357)   promethazine (PHENERGAN) injection (IM or IVPB)     PRN Meds: acetaminophen **OR** acetaminophen, hydrALAZINE, metoprolol  tartrate, ondansetron **OR** ondansetron (ZOFRAN) IV, promethazine (PHENERGAN) injection (IM or IVPB)   Vital Signs    Vitals:   01/19/24 1800 01/19/24 2057 01/20/24 0054 01/20/24 0409  BP:  105/77 110/66 118/76  Pulse:  (!) 112 88 72  Resp: (!) 24 17 17 18   Temp:  98.7 F (37.1 C) 98.6 F (37 C) 99 F (37.2 C)  TempSrc:  Oral Oral Oral  SpO2:  95% 96% 96%  Weight:      Height:        Intake/Output Summary (Last 24 hours) at 01/20/2024 0815 Last data filed at 01/20/2024 0800 Gross per 24 hour  Intake 2422.28 ml  Output --  Net 2422.28 ml   Filed Weights   01/16/24 2145  Weight: 74.8 kg    Telemetry    A-fib with ventricular rates in the low 100s to 120s to 130s bpm - Personally Reviewed  ECG    No new tracings - Personally Reviewed  Physical Exam   GEN: No acute distress.   Neck: No JVD. Cardiac: Tachycardic and irregularly irregular, no murmurs, rubs, or gallops.  Respiratory: Clear to auscultation bilaterally.  GI: Soft, nontender, non-distended.   MS: No edema; No deformity. Neuro:  Alert and oriented x 3; Nonfocal.  Psych: Normal affect.  Labs     Chemistry Recent Labs  Lab 01/16/24 2146 01/17/24 0000 01/18/24 0556  NA 136  --  135  K 4.0  --  3.7  CL 100  --  99  CO2 24  --  24  GLUCOSE 162*  --  161*  BUN 22  --  11  CREATININE 0.96  --  0.96  CALCIUM  9.3  --  8.6*  PROT  --  7.4  --   ALBUMIN  --  4.4  --   AST  --  25  --   ALT  --  25  --   ALKPHOS  --  52  --   BILITOT  --  0.4  --   GFRNONAA >60  --  >60  ANIONGAP 12  --  12     Hematology Recent Labs  Lab 01/16/24 2146 01/18/24 0556 01/20/24 0438  WBC 7.4 16.8* 10.9*  RBC 4.55 4.79 4.14  HGB 13.8 14.6 12.6  HCT 41.6 43.2 38.4  MCV 91.4 90.2 92.8  MCH 30.3 30.5 30.4  MCHC 33.2 33.8 32.8  RDW 12.8 13.1 13.2  PLT 200 181 160    Cardiac EnzymesNo results for input(s): TROPONINI in the last 168 hours. No results for input(s): TROPIPOC in the last 168 hours.   BNPNo results for input(s): BNP, PROBNP in the last 168  hours.   DDimer No results for input(s): DDIMER in the last 168 hours.   Radiology    No results found.  Cardiac Studies     Patient Profile     68 y.o. female with history of PAF, HTN, HLD, DM2, glaucoma, and environmental allergies who underwent cholecystectomy and developed A-fib with RVR postoperatively.  Assessment & Plan    1.  A-fib with RVR: Presented in sinus rhythm with development of A-fib with RVR postoperatively following cholecystectomy.  Remains in A-fib with ventricular rates in the low 100s to 130s bpm.  Continue amiodarone drip, perhaps this will pharmacologically cardiovert her.  Tolerating heparin drip with recommendation to resume DOAC when cleared by surgery.  For added ventricular rate control, titrate Lopressor  to PTA dose of 25 mg twice daily.  If ventricular rates remain difficult to control, she may require DCCV prior to discharge which would not happen until 10/20.  If ventricular rates are well-controlled with ambulation we can pursue restoration of sinus rhythm in the outpatient setting.   CHA2DS2-VASc at least 4.  2.  HTN: Blood pressure currently well-controlled.  PTA lisinopril  currently on hold.  Titrate PTA metoprolol  as above.  3.  HLD: PTA rosuvastatin  10 mg.       For questions or updates, please contact [facility] Please consult [email or web address] for contact info under Cardiology/STEMI.    Signed, Bernardino Bring, PA-C [facility] Pager: [phone] 01/20/2024, 8:15 AM

## 2024-01-20 NOTE — Anesthesia Postprocedure Evaluation (Signed)
 Anesthesia Post Note  Patient: Nayvie Lips  Procedure(s) Performed: CHOLECYSTECTOMY, ROBOT-ASSISTED, LAPAROSCOPIC  Patient location during evaluation: PACU Anesthesia Type: General Level of consciousness: awake and alert Pain management: pain level controlled Vital Signs Assessment: post-procedure vital signs reviewed and stable Respiratory status: spontaneous breathing, nonlabored ventilation, respiratory function stable and patient connected to nasal cannula oxygen Cardiovascular status: blood pressure returned to baseline and stable Postop Assessment: no apparent nausea or vomiting Anesthetic complications: no   No notable events documented.   Last Vitals:  Vitals:   01/20/24 0409 01/20/24 0841  BP: 118/76 116/76  Pulse: 72 72  Resp: 18 18  Temp: 37.2 C 36.9 C  SpO2: 96% 97%    Last Pain:  Vitals:   01/20/24 0409  TempSrc: Oral  PainSc:                  Lynwood KANDICE Clause

## 2024-01-20 NOTE — Plan of Care (Signed)
  Problem: Health Behavior/Discharge Planning: Goal: Ability to manage health-related needs will improve Outcome: Progressing   Problem: Skin Integrity: Goal: Risk for impaired skin integrity will decrease Outcome: Progressing   Problem: Education: Goal: Knowledge of General Education information will improve Description: Including pain rating scale, medication(s)/side effects and non-pharmacologic comfort measures Outcome: Progressing   Problem: Clinical Measurements: Goal: Respiratory complications will improve Outcome: Progressing   Problem: Activity: Goal: Risk for activity intolerance will decrease Outcome: Progressing   Problem: Pain Managment: Goal: General experience of comfort will improve and/or be controlled Outcome: Progressing   Problem: Clinical Measurements: Goal: Cardiovascular complication will be avoided Outcome: Not Progressing

## 2024-01-21 DIAGNOSIS — E782 Mixed hyperlipidemia: Secondary | ICD-10-CM | POA: Diagnosis not present

## 2024-01-21 DIAGNOSIS — I480 Paroxysmal atrial fibrillation: Secondary | ICD-10-CM | POA: Diagnosis not present

## 2024-01-21 DIAGNOSIS — E1165 Type 2 diabetes mellitus with hyperglycemia: Secondary | ICD-10-CM | POA: Diagnosis not present

## 2024-01-21 DIAGNOSIS — I10 Essential (primary) hypertension: Secondary | ICD-10-CM | POA: Diagnosis not present

## 2024-01-21 DIAGNOSIS — K8050 Calculus of bile duct without cholangitis or cholecystitis without obstruction: Secondary | ICD-10-CM | POA: Diagnosis not present

## 2024-01-21 LAB — GLUCOSE, CAPILLARY
Glucose-Capillary: 129 mg/dL — ABNORMAL HIGH (ref 70–99)
Glucose-Capillary: 209 mg/dL — ABNORMAL HIGH (ref 70–99)
Glucose-Capillary: 191 mg/dL — ABNORMAL HIGH (ref 70–99)
Glucose-Capillary: 206 mg/dL — ABNORMAL HIGH (ref 70–99)

## 2024-01-21 MED ORDER — METOPROLOL TARTRATE 25 MG PO TABS
25.0000 mg | ORAL_TABLET | Freq: Four times a day (QID) | ORAL | Status: DC
  Administered 2024-01-21 – 2024-01-22 (×5): 25 mg via ORAL
  Filled 2024-01-21 (×6): qty 1

## 2024-01-21 MED ADMIN — Digoxin Tab 250 MCG (0.25 MG): 0.25 mg | ORAL | NDC 00904592261

## 2024-01-21 MED FILL — Digoxin Tab 250 MCG (0.25 MG): 0.2500 mg | ORAL | Qty: 1 | Status: AC

## 2024-01-21 NOTE — Progress Notes (Signed)
 Progress Note  Patient Name: April Solis Date of Encounter: 01/21/2024  Primary Cardiologist: End  Subjective   No chest pain, dyspnea, dizziness, presyncope, or syncope.  She continue to notice palpitations postoperatively.  She remains in A-fib with ventricular rates in the 90s to low 100s with frequent brief paroxysms into the 120s to 140s bpm.  Remains on amiodarone and heparin drips.  BP stable.  Inpatient Medications    Scheduled Meds:  apixaban   5 mg Oral BID   insulin aspart  0-15 Units Subcutaneous TID WC   insulin aspart  0-5 Units Subcutaneous QHS   metoprolol  tartrate  25 mg Oral Q6H   rosuvastatin   10 mg Oral Daily   Continuous Infusions:  amiodarone 30 mg/hr (01/21/24 0056)   promethazine (PHENERGAN) injection (IM or IVPB)     PRN Meds: acetaminophen **OR** acetaminophen, hydrALAZINE, metoprolol  tartrate, ondansetron **OR** ondansetron (ZOFRAN) IV, promethazine (PHENERGAN) injection (IM or IVPB)   Vital Signs    Vitals:   01/20/24 1933 01/20/24 2321 01/21/24 0401 01/21/24 0725  BP: 112/80 (!) 95/55 123/77 117/82  Pulse: (!) 105 66 (!) 110 (!) 101  Resp: 19 18 19 20   Temp: 99 F (37.2 C) 98.2 F (36.8 C) 98.9 F (37.2 C) 98.8 F (37.1 C)  TempSrc: Oral Oral Oral Oral  SpO2: 98% 96% 95% 97%  Weight:      Height:        Intake/Output Summary (Last 24 hours) at 01/21/2024 0751 Last data filed at 01/21/2024 0300 Gross per 24 hour  Intake 996.7 ml  Output --  Net 996.7 ml   Filed Weights   01/16/24 2145  Weight: 74.8 kg    Telemetry    A-fib with ventricular rates in the 90s to low 100s with frequent brief paroxysms into the 120s to 140s bpm - Personally Reviewed  ECG    No new tracings - Personally Reviewed  Physical Exam   GEN: No acute distress.   Neck: No JVD. Cardiac: Tachycardic and irregularly irregular, no murmurs, rubs, or gallops.  Respiratory: Clear to auscultation bilaterally.  GI: Soft, nontender,  non-distended.   MS: No edema; No deformity. Neuro:  Alert and oriented x 3; Nonfocal.  Psych: Normal affect.  Labs    Chemistry Recent Labs  Lab 01/16/24 2146 01/17/24 0000 01/18/24 0556  NA 136  --  135  K 4.0  --  3.7  CL 100  --  99  CO2 24  --  24  GLUCOSE 162*  --  161*  BUN 22  --  11  CREATININE 0.96  --  0.96  CALCIUM  9.3  --  8.6*  PROT  --  7.4  --   ALBUMIN  --  4.4  --   AST  --  25  --   ALT  --  25  --   ALKPHOS  --  52  --   BILITOT  --  0.4  --   GFRNONAA >60  --  >60  ANIONGAP 12  --  12     Hematology Recent Labs  Lab 01/16/24 2146 01/18/24 0556 01/20/24 0438  WBC 7.4 16.8* 10.9*  RBC 4.55 4.79 4.14  HGB 13.8 14.6 12.6  HCT 41.6 43.2 38.4  MCV 91.4 90.2 92.8  MCH 30.3 30.5 30.4  MCHC 33.2 33.8 32.8  RDW 12.8 13.1 13.2  PLT 200 181 160    Cardiac EnzymesNo results for input(s): TROPONINI in the last 168 hours. No results  for input(s): TROPIPOC in the last 168 hours.   BNPNo results for input(s): BNP, PROBNP in the last 168 hours.   DDimer No results for input(s): DDIMER in the last 168 hours.   Radiology    No results found.  Cardiac Studies     Patient Profile     68 y.o. female with history of PAF, HTN, HLD, DM2, glaucoma, and environmental allergies who underwent cholecystectomy and developed A-fib with RVR postoperatively.  Assessment & Plan    1.  A-fib with RVR: Presented in sinus rhythm with development of A-fib with RVR postoperatively following cholecystectomy.  Remains in A-fib with ventricular rates in the low 100s to 130s bpm.  Continue amiodarone drip.  Has been transitioned from heparin drip to Eliquis  today per IM.  For added ventricular rate control, we increased her Lopressor  to 25 mg bid on 10/17. This was further increased by the hospitalist service to 50 mg bid.  Ventricular rates remain suboptimally controlled.  Transition Lopressor  to 25 mg every 6 hours.  Relative hypotension precludes further  escalation.  If ventricular rates remain difficult to control, she will require DCCV prior to discharge which would not happen until 10/20.  TEE is not needed, as she presented in sinus rhythm and developed Afib postoperatively.  If ventricular rates are well-controlled with ambulation we can pursue restoration of sinus rhythm in the outpatient setting.  CHA2DS2-VASc at least 4.  2.  HTN: Blood pressure currently well-controlled.  PTA lisinopril  currently on hold.  Titrate PTA metoprolol  as above.  3.  HLD: PTA rosuvastatin  10 mg.       For questions or updates, please contact [facility] Please consult [email or web address] for contact info under Cardiology/STEMI.    Signed, Bernardino Bring, PA-C [facility] Pager: [phone] 01/21/2024, 7:51 AM

## 2024-01-21 NOTE — Progress Notes (Addendum)
 Progress Note   Patient: April Solis FMW:[id number] DOB: February 24, 1956 DOA: 01/16/2024     4 DOS: the patient was seen and examined on 01/21/2024   Brief hospital course: Ms. Jacquelene Kopecky is a 68 year old female with history of hypertension, hyperlipidemia, paroxysmal atrial fibrillation on Eliquis , non-insulin-dependent diabetes mellitus, who presents ED for chief concerns of epigastric pain and centralized chest pain and dizziness. CT scan showed gallstone, patient has biliary colic. Patient was started started on Zosyn, cholecystectomy was performed on 10/15, gallbladder appeared inflated. Postoperatively, patient converted to atrial fibrillation with RVR.  Started on diltiazem. Atrial fibrillation did not convert on 10/16, switched to amiodarone and heparin, cardiology consulted.  Decision was made for rate control only, Eliquis  was restarted on 10/18 after discussion with general surgery.   Principal Problem:   Biliary colic Active Problems:   Hypertension   Diabetes mellitus (HCC)   Hyperlipidemia associated with type 2 diabetes mellitus (HCC)   Paroxysmal atrial fibrillation with rapid ventricular response (HCC)   Nonspecific chest pain   Calculus of gallbladder without cholecystitis without obstruction   Mixed hyperlipidemia   Assessment and Plan: Paroxysmal atrial fibrillation with rapid ventricular response (HCC) Patient developed significant tachycardia after surgery, heart rate went up to 130s, diltiazem drip was started. Patient continued to have atrial fibrillation on 10/16, changed to amiodarone and heparin.  Per cardiology, no plan for cardioversion, heparin discontinued.  Eliquis  restarted.  Amiodarone drip continued. Currently on increased dose of metoprolol .  Heart rate still fast, added digoxin 0.25 mg 1 dose today, plan to start tomorrow at 0.125 tomorrow.   * Biliary colic Acute cholecystitis ruled in Patient is status post cholecystectomy,  gallbladder appeared to be inflated during surgery. Patient has a confirmed cholecystitis. Per general surgery, patient need to complete 3 doses of Zosyn, then discontinue.  No additional need for Zosyn. Patient doing well, no significant abdominal pain or nausea vomiting.   Hyperlipidemia associated with type 2 diabetes mellitus (HCC) Rosuvastatin  10 mg daily   Diabetes mellitus (HCC) Continue to follow glucose, will add Lantus if needed   Hypertension Continue metoprolol .  hold off lisinopril  to allow more medicine for heart rate control.   Class I obesity with BMI 31.18. Diet and excise advised.     Subjective:  Patient doing well today, no short of breath.  Tolerating diet, no nausea vomiting  Physical Exam: Vitals:   01/20/24 2321 01/21/24 0401 01/21/24 0725 01/21/24 0805  BP: (!) 95/55 123/77 117/82   Pulse: 66 (!) 110 (!) 101 (!) 106  Resp: 18 19 20    Temp: 98.2 F (36.8 C) 98.9 F (37.2 C) 98.8 F (37.1 C)   TempSrc: Oral Oral Oral   SpO2: 96% 95% 97%   Weight:      Height:       General exam: Appears calm and comfortable  Respiratory system: Clear to auscultation. Respiratory effort normal. Cardiovascular system: Irregularly irregular, tachycardic, no JVD, murmurs, rubs, gallops or clicks. No pedal edema. Gastrointestinal system: Abdomen is nondistended, soft and nontender. No organomegaly or masses felt. Normal bowel sounds heard. Central nervous system: Alert and oriented. No focal neurological deficits. Extremities: Symmetric 5 x 5 power. Skin: No rashes, lesions or ulcers Psychiatry: Judgement and insight appear normal. Mood & affect appropriate.    Data Reviewed:  Lab results reviewed.  Family Communication: updated husband over the phone  Disposition: Status is: Inpatient Remains inpatient appropriate because: Severity of disease,     Time spent: 35 minutes  Author:  Murvin Mana, MD 01/21/2024 10:43 AM  For on call review  [email or web address].

## 2024-01-21 NOTE — Plan of Care (Signed)
  Problem: Nutritional: Goal: Maintenance of adequate nutrition will improve Outcome: Progressing   Problem: Activity: Goal: Risk for activity intolerance will decrease Outcome: Progressing   Problem: Nutrition: Goal: Adequate nutrition will be maintained Outcome: Progressing   Problem: Elimination: Goal: Will not experience complications related to bowel motility Outcome: Progressing Goal: Will not experience complications related to urinary retention Outcome: Progressing   Problem: Pain Managment: Goal: General experience of comfort will improve and/or be controlled Outcome: Progressing   Problem: Safety: Goal: Ability to remain free from injury will improve Outcome: Progressing   Problem: Skin Integrity: Goal: Risk for impaired skin integrity will decrease Outcome: Progressing

## 2024-01-22 DIAGNOSIS — I480 Paroxysmal atrial fibrillation: Secondary | ICD-10-CM | POA: Diagnosis not present

## 2024-01-22 DIAGNOSIS — I10 Essential (primary) hypertension: Secondary | ICD-10-CM | POA: Diagnosis not present

## 2024-01-22 DIAGNOSIS — K8050 Calculus of bile duct without cholangitis or cholecystitis without obstruction: Secondary | ICD-10-CM | POA: Diagnosis not present

## 2024-01-22 LAB — GLUCOSE, CAPILLARY
Glucose-Capillary: 153 mg/dL — ABNORMAL HIGH (ref 70–99)
Glucose-Capillary: 236 mg/dL — ABNORMAL HIGH (ref 70–99)
Glucose-Capillary: 147 mg/dL — ABNORMAL HIGH (ref 70–99)
Glucose-Capillary: 185 mg/dL — ABNORMAL HIGH (ref 70–99)

## 2024-01-22 MED ORDER — METOPROLOL TARTRATE 50 MG PO TABS
50.0000 mg | ORAL_TABLET | Freq: Two times a day (BID) | ORAL | Status: DC
  Administered 2024-01-22 – 2024-01-24 (×4): 50 mg via ORAL
  Filled 2024-01-22 (×4): qty 1

## 2024-01-22 NOTE — Progress Notes (Signed)
 Progress Note   Patient: April Solis FMW:[id number] DOB: January 25, 1956 DOA: 01/16/2024     5 DOS: the patient was seen and examined on 01/22/2024   Brief hospital course: Ms. April Solis is a 68 year old female with history of hypertension, hyperlipidemia, paroxysmal atrial fibrillation on Eliquis , non-insulin-dependent diabetes mellitus, who presents ED for chief concerns of epigastric pain and centralized chest pain and dizziness. CT scan showed gallstone, patient has biliary colic. Patient was started started on Zosyn, cholecystectomy was performed on 10/15, gallbladder appeared inflated. Postoperatively, patient converted to atrial fibrillation with RVR.  Started on diltiazem. Atrial fibrillation did not convert on 10/16, switched to amiodarone and heparin, cardiology consulted.  Decision was made for rate control only, Eliquis  was restarted on 10/18 after discussion with general surgery.   Principal Problem:   Biliary colic Active Problems:   Hypertension   Diabetes mellitus (HCC)   Hyperlipidemia associated with type 2 diabetes mellitus (HCC)   Paroxysmal atrial fibrillation with rapid ventricular response (HCC)   Nonspecific chest pain   Calculus of gallbladder without cholecystitis without obstruction   Mixed hyperlipidemia   Assessment and Plan:  Paroxysmal atrial fibrillation with rapid ventricular response (HCC) Patient developed significant tachycardia after surgery, heart rate went up to 130s, diltiazem drip was started. Patient continued to have atrial fibrillation on 10/16, changed to amiodarone and heparin.  Per cardiology, no plan for cardioversion, heparin discontinued.  Eliquis  restarted.  Amiodarone drip continued. Currently treated with metoprolol  and digoxin. Heart rate seems to be better today, but still in atrial fibrillation.  Cardiology is considering possibility of cardioversion on Monday.   * Biliary colic Acute cholecystitis ruled  in Patient is status post cholecystectomy, gallbladder appeared to be inflated during surgery. Patient has a confirmed cholecystitis. Per general surgery, patient need to complete 3 doses of Zosyn, then discontinue.  No additional need for Zosyn. Patient doing well, no significant abdominal pain or nausea vomiting.   Hyperlipidemia associated with type 2 diabetes mellitus (HCC) Rosuvastatin  10 mg daily   Diabetes mellitus (HCC) Continue to follow glucose, will add Lantus if needed   Hypertension Continue metoprolol .  hold off lisinopril  to allow more medicine for heart rate control.   Class I obesity with BMI 31.18. Diet and excise advised.     Subjective:  Patient doing well, currently not short of breath.  No palpitation.  Has a bowel movement  Physical Exam: Vitals:   01/22/24 0955 01/22/24 1202 01/22/24 1237 01/22/24 1313  BP:  (!) 112/56    Pulse:  (!) 48    Resp: 20  17 17   Temp:  98.9 F (37.2 C)    TempSrc:      SpO2:  99%    Weight:      Height:       General exam: Appears calm and comfortable  Respiratory system: Clear to auscultation. Respiratory effort normal. Cardiovascular system: Irregularly irregular and tachycardic. No JVD, murmurs, rubs, gallops or clicks. No pedal edema. Gastrointestinal system: Abdomen is nondistended, soft and nontender. No organomegaly or masses felt. Normal bowel sounds heard. Central nervous system: Alert and oriented. No focal neurological deficits. Extremities: Symmetric 5 x 5 power. Skin: No rashes, lesions or ulcers Psychiatry: Judgement and insight appear normal. Mood & affect appropriate.    Data Reviewed:  Lab results reviewed.  Family Communication: None  Disposition: Status is: Inpatient Remains inpatient appropriate because: Severity of disease,     Time spent: 35 minutes  Author: Murvin Mana, MD 01/22/2024 1:40 PM  For on call review [email or web address]  '

## 2024-01-22 NOTE — Plan of Care (Signed)
  Problem: Fluid Volume: Goal: Ability to maintain a balanced intake and output will improve Outcome: Progressing   Problem: Tissue Perfusion: Goal: Adequacy of tissue perfusion will improve Outcome: Progressing   Problem: Clinical Measurements: Goal: Ability to maintain clinical measurements within normal limits will improve Outcome: Progressing

## 2024-01-22 NOTE — Progress Notes (Signed)
 Progress Note  Patient Name: April Solis Date of Encounter: 01/22/2024  Primary Cardiologist: End  Subjective   She continues to go in and out of Afib and sinus rhythm with paroxysms of Afib with RVR into the 140s bpm at times on amiodarone gtt and Lopressor . No chest pain or dyspnea. Intermittent palpitations.   Inpatient Medications    Scheduled Meds:  apixaban   5 mg Oral BID   insulin aspart  0-15 Units Subcutaneous TID WC   insulin aspart  0-5 Units Subcutaneous QHS   metoprolol  tartrate  25 mg Oral Q6H   rosuvastatin   10 mg Oral Daily   Continuous Infusions:  amiodarone 30 mg/hr (01/22/24 0126)   promethazine (PHENERGAN) injection (IM or IVPB)     PRN Meds: acetaminophen **OR** acetaminophen, hydrALAZINE, metoprolol  tartrate, ondansetron **OR** ondansetron (ZOFRAN) IV, promethazine (PHENERGAN) injection (IM or IVPB)   Vital Signs    Vitals:   01/22/24 0024 01/22/24 0248 01/22/24 0419 01/22/24 0731  BP: 110/61 124/80 110/73 102/67  Pulse: (!) 46 92 67 99  Resp: 20  20 18   Temp: 98.3 F (36.8 C)  98.3 F (36.8 C) 99 F (37.2 C)  TempSrc:      SpO2: 95%  97% 95%  Weight:      Height:        Intake/Output Summary (Last 24 hours) at 01/22/2024 0755 Last data filed at 01/21/2024 1912 Gross per 24 hour  Intake 1193.19 ml  Output --  Net 1193.19 ml   Filed Weights   01/16/24 2145  Weight: 74.8 kg    Telemetry    Paroxysms of Afib with rates ranging from the 60s to 140s bpm with intermittent conversion to sinus rhythm - Personally Reviewed  ECG    No new tracings - Personally Reviewed  Physical Exam   GEN: No acute distress.   Neck: No JVD. Cardiac: Irregularly irregular, no murmurs, rubs, or gallops.  Respiratory: Clear to auscultation bilaterally.  GI: Soft, nontender, non-distended.   MS: No edema; No deformity. Neuro:  Alert and oriented x 3; Nonfocal.  Psych: Normal affect.  Labs    Chemistry Recent Labs  Lab  01/16/24 2146 01/17/24 0000 01/18/24 0556  NA 136  --  135  K 4.0  --  3.7  CL 100  --  99  CO2 24  --  24  GLUCOSE 162*  --  161*  BUN 22  --  11  CREATININE 0.96  --  0.96  CALCIUM  9.3  --  8.6*  PROT  --  7.4  --   ALBUMIN  --  4.4  --   AST  --  25  --   ALT  --  25  --   ALKPHOS  --  52  --   BILITOT  --  0.4  --   GFRNONAA >60  --  >60  ANIONGAP 12  --  12     Hematology Recent Labs  Lab 01/16/24 2146 01/18/24 0556 01/20/24 0438  WBC 7.4 16.8* 10.9*  RBC 4.55 4.79 4.14  HGB 13.8 14.6 12.6  HCT 41.6 43.2 38.4  MCV 91.4 90.2 92.8  MCH 30.3 30.5 30.4  MCHC 33.2 33.8 32.8  RDW 12.8 13.1 13.2  PLT 200 181 160    Cardiac EnzymesNo results for input(s): TROPONINI in the last 168 hours. No results for input(s): TROPIPOC in the last 168 hours.   BNPNo results for input(s): BNP, PROBNP in the last 168 hours.  DDimer No results for input(s): DDIMER in the last 168 hours.   Radiology    No results found.  Cardiac Studies     Patient Profile     68 y.o. female with history of PAF, HTN, HLD, DM2, glaucoma, and environmental allergies who underwent cholecystectomy and developed A-fib with RVR postoperatively.  Assessment & Plan    1.  A-fib with RVR: Presented in sinus rhythm with development of A-fib with RVR postoperatively following cholecystectomy.  Over the past 24 hours she has been intermittently going in and out of Afib with sinus rhythm with paroxysms of Afib with RVR into the 140s bpm.  Continue amiodarone drip and titrated dose of Lopressor  25 mg every 6 hours.  Has been transitioned from heparin drip to Eliquis  per IM.  Relative hypotension precludes further escalation.  Recommend EP evaluation on 10/20.  With intermittent spontaneous conversion to sinus rhythm and redevlopement of Afib, DCCV is not a great option. If DCCV is pursued, TEE is not needed, as she presented in sinus rhythm and developed Afib postoperatively.    2.  HTN: Blood  pressure currently well-controlled.  PTA lisinopril  currently on hold.  Titrate PTA metoprolol  as above.  3.  HLD: PTA rosuvastatin  10 mg.       For questions or updates, please contact [facility] Please consult [email or web address] for contact info under Cardiology/STEMI.    Signed, Bernardino Bring, PA-C [facility] Pager: [phone] 01/22/2024, 7:55 AM

## 2024-01-22 NOTE — Plan of Care (Signed)
  Problem: Education: Goal: Ability to describe self-care measures that may prevent or decrease complications (Diabetes Survival Skills Education) will improve 01/22/2024 0616 by Georgina Briana SQUIBB, RN Outcome: Progressing 01/22/2024 0616 by Georgina Briana SQUIBB, RN Outcome: Progressing Goal: Individualized Educational Video(s) 01/22/2024 0616 by Georgina Briana SQUIBB, RN Outcome: Progressing 01/22/2024 0616 by Georgina Briana SQUIBB, RN Outcome: Progressing   Problem: Coping: Goal: Ability to adjust to condition or change in health will improve 01/22/2024 0616 by Georgina Briana SQUIBB, RN Outcome: Progressing 01/22/2024 0616 by Georgina Briana SQUIBB, RN Outcome: Progressing   Problem: Fluid Volume: Goal: Ability to maintain a balanced intake and output will improve 01/22/2024 0616 by Georgina Briana SQUIBB, RN Outcome: Progressing 01/22/2024 0616 by Georgina Briana SQUIBB, RN Outcome: Progressing   Problem: Health Behavior/Discharge Planning: Goal: Ability to identify and utilize available resources and services will improve 01/22/2024 0616 by Georgina Briana SQUIBB, RN Outcome: Progressing 01/22/2024 0616 by Georgina Briana SQUIBB, RN Outcome: Progressing Goal: Ability to manage health-related needs will improve 01/22/2024 0616 by Georgina Briana SQUIBB, RN Outcome: Progressing 01/22/2024 0616 by Georgina Briana SQUIBB, RN Outcome: Progressing   Problem: Metabolic: Goal: Ability to maintain appropriate glucose levels will improve 01/22/2024 0616 by Georgina Briana SQUIBB, RN Outcome: Progressing 01/22/2024 0616 by Georgina Briana SQUIBB, RN Outcome: Progressing   Problem: Nutritional: Goal: Maintenance of adequate nutrition will improve 01/22/2024 0616 by Georgina Briana SQUIBB, RN Outcome: Progressing 01/22/2024 0616 by Georgina Briana SQUIBB, RN Outcome: Progressing Goal: Progress toward achieving an optimal weight will improve 01/22/2024 0616 by Georgina Briana SQUIBB, RN Outcome: Progressing 01/22/2024 0616 by Georgina Briana SQUIBB, RN Outcome:  Progressing   Problem: Skin Integrity: Goal: Risk for impaired skin integrity will decrease 01/22/2024 0616 by Georgina Briana SQUIBB, RN Outcome: Progressing 01/22/2024 0616 by Georgina Briana SQUIBB, RN Outcome: Progressing   Problem: Tissue Perfusion: Goal: Adequacy of tissue perfusion will improve 01/22/2024 0616 by Georgina Briana SQUIBB, RN Outcome: Progressing 01/22/2024 0616 by Georgina Briana SQUIBB, RN Outcome: Progressing   Problem: Education: Goal: Knowledge of General Education information will improve Description: Including pain rating scale, medication(s)/side effects and non-pharmacologic comfort measures Outcome: Progressing   Problem: Health Behavior/Discharge Planning: Goal: Ability to manage health-related needs will improve Outcome: Progressing   Problem: Clinical Measurements: Goal: Ability to maintain clinical measurements within normal limits will improve Outcome: Progressing Goal: Will remain free from infection Outcome: Progressing Goal: Diagnostic test results will improve Outcome: Progressing Goal: Respiratory complications will improve Outcome: Progressing Goal: Cardiovascular complication will be avoided Outcome: Progressing   Problem: Activity: Goal: Risk for activity intolerance will decrease Outcome: Progressing   Problem: Nutrition: Goal: Adequate nutrition will be maintained Outcome: Progressing   Problem: Coping: Goal: Level of anxiety will decrease Outcome: Progressing   Problem: Elimination: Goal: Will not experience complications related to bowel motility Outcome: Progressing Goal: Will not experience complications related to urinary retention Outcome: Progressing   Problem: Pain Managment: Goal: General experience of comfort will improve and/or be controlled Outcome: Progressing   Problem: Safety: Goal: Ability to remain free from injury will improve Outcome: Progressing   Problem: Skin Integrity: Goal: Risk for impaired skin integrity  will decrease Outcome: Progressing

## 2024-01-23 DIAGNOSIS — E1165 Type 2 diabetes mellitus with hyperglycemia: Secondary | ICD-10-CM | POA: Diagnosis not present

## 2024-01-23 DIAGNOSIS — E876 Hypokalemia: Secondary | ICD-10-CM | POA: Diagnosis not present

## 2024-01-23 DIAGNOSIS — I480 Paroxysmal atrial fibrillation: Secondary | ICD-10-CM | POA: Diagnosis not present

## 2024-01-23 DIAGNOSIS — I4891 Unspecified atrial fibrillation: Secondary | ICD-10-CM

## 2024-01-23 DIAGNOSIS — K8050 Calculus of bile duct without cholangitis or cholecystitis without obstruction: Secondary | ICD-10-CM | POA: Diagnosis not present

## 2024-01-23 LAB — GLUCOSE, CAPILLARY
Glucose-Capillary: 180 mg/dL — ABNORMAL HIGH (ref 70–99)
Glucose-Capillary: 172 mg/dL — ABNORMAL HIGH (ref 70–99)
Glucose-Capillary: 154 mg/dL — ABNORMAL HIGH (ref 70–99)
Glucose-Capillary: 165 mg/dL — ABNORMAL HIGH (ref 70–99)

## 2024-01-23 LAB — COMPREHENSIVE METABOLIC PANEL WITH GFR
ALT: 59 U/L — ABNORMAL HIGH (ref 0–44)
AST: 32 U/L (ref 15–41)
Albumin: 2.4 g/dL — ABNORMAL LOW (ref 3.5–5.0)
Alkaline Phosphatase: 100 U/L (ref 38–126)
Anion gap: 12 (ref 5–15)
BUN: 8 mg/dL (ref 8–23)
CO2: 26 mmol/L (ref 22–32)
Calcium: 8 mg/dL — ABNORMAL LOW (ref 8.9–10.3)
Chloride: 101 mmol/L (ref 98–111)
Creatinine, Ser: 0.63 mg/dL (ref 0.44–1.00)
GFR, Estimated: >60 mL/min (ref >60)
Glucose, Bld: 205 mg/dL — ABNORMAL HIGH (ref 70–99)
Potassium: 2.7 mmol/L — CL (ref 3.5–5.1)
Sodium: 139 mmol/L (ref 135–145)
Total Bilirubin: 0.5 mg/dL (ref 0.0–1.2)
Total Protein: 6.4 g/dL — ABNORMAL LOW (ref 6.5–8.1)

## 2024-01-23 LAB — CBC
HCT: 38.2 % (ref 36.0–46.0)
Hemoglobin: 12.5 g/dL (ref 12.0–15.0)
MCH: 29.6 pg (ref 26.0–34.0)
MCHC: 32.7 g/dL (ref 30.0–36.0)
MCV: 90.3 fL (ref 80.0–100.0)
Platelets: 297 K/uL (ref 150–400)
RBC: 4.23 MIL/uL (ref 3.87–5.11)
RDW: 13.7 % (ref 11.5–15.5)
WBC: 12.3 K/uL — ABNORMAL HIGH (ref 4.0–10.5)
nRBC: 0 % (ref 0.0–0.2)

## 2024-01-23 LAB — TSH: TSH: 1.711 u[IU]/mL (ref 0.350–4.500)

## 2024-01-23 MED ORDER — POTASSIUM CHLORIDE 10 MEQ/100ML IV SOLN
10.0000 meq | INTRAVENOUS | Status: AC
  Administered 2024-01-23 (×2): 10 meq via INTRAVENOUS
  Filled 2024-01-23 (×2): qty 100

## 2024-01-23 MED ORDER — POTASSIUM CHLORIDE 20 MEQ PO PACK
40.0000 meq | PACK | ORAL | Status: AC
  Administered 2024-01-23 (×2): 40 meq via ORAL
  Filled 2024-01-23 (×2): qty 2

## 2024-01-23 MED ORDER — HYALURONIDASE HUMAN 150 UNIT/ML IJ SOLN
150.0000 [IU] | Freq: Once | INTRAMUSCULAR | Status: AC
  Administered 2024-01-23: 150 [IU] via SUBCUTANEOUS
  Filled 2024-01-23: qty 1

## 2024-01-23 MED ORDER — SIMETHICONE 80 MG PO CHEW
80.0000 mg | CHEWABLE_TABLET | Freq: Four times a day (QID) | ORAL | Status: DC | PRN
  Administered 2024-01-23: 80 mg via ORAL
  Filled 2024-01-23: qty 1

## 2024-01-23 MED ORDER — AMIODARONE HCL 200 MG PO TABS
400.0000 mg | ORAL_TABLET | Freq: Two times a day (BID) | ORAL | Status: DC
  Administered 2024-01-23 – 2024-01-24 (×3): 400 mg via ORAL
  Filled 2024-01-23 (×3): qty 2

## 2024-01-23 NOTE — Progress Notes (Signed)
 Progress Note   Patient: Dashanae Longfield FMW:[id number] DOB: 1955-10-11 DOA: 01/16/2024     6 DOS: the patient was seen and examined on 01/23/2024   Brief hospital course: Ms. Vetta Couzens is a 68 year old female with history of hypertension, hyperlipidemia, paroxysmal atrial fibrillation on Eliquis , non-insulin-dependent diabetes mellitus, who presents ED for chief concerns of epigastric pain and centralized chest pain and dizziness. CT scan showed gallstone, patient has biliary colic. Patient was started started on Zosyn, cholecystectomy was performed on 10/15, gallbladder appeared inflated. Postoperatively, patient converted to atrial fibrillation with RVR.  Started on diltiazem. Atrial fibrillation did not convert on 10/16, switched to amiodarone and heparin, cardiology consulted.  Decision was made for rate control only, Eliquis  was restarted on 10/18 after discussion with general surgery.   Principal Problem:   Biliary colic Active Problems:   Hypertension   Diabetes mellitus (HCC)   Hyperlipidemia associated with type 2 diabetes mellitus (HCC)   Paroxysmal atrial fibrillation with rapid ventricular response (HCC)   Nonspecific chest pain   Calculus of gallbladder without cholecystitis without obstruction   Mixed hyperlipidemia   Assessment and Plan: Paroxysmal atrial fibrillation with rapid ventricular response (HCC) Patient developed significant tachycardia after surgery, heart rate went up to 130s, diltiazem drip was started. Patient continued to have atrial fibrillation on 10/16, changed to amiodarone and heparin.  Per cardiology, no plan for cardioversion, heparin discontinued.  Eliquis  restarted.  Amiodarone drip completed course. Metoprolol  50 mg twice a day per cardiology.  Digoxin discontinued. Discussed with cardiology, if patient does not convert by tomorrow, will consider cardioversion.  Hypokalemia. Repleted with IV and oral.  Recheck lab tomorrow     * Biliary colic Acute cholecystitis ruled in Patient is status post cholecystectomy, gallbladder appeared to be inflated during surgery. Patient has a confirmed cholecystitis. Per general surgery, patient need to complete 3 doses of Zosyn, then discontinue.  No additional need for Zosyn. Condition resolved.   Hyperlipidemia associated with type 2 diabetes mellitus (HCC) Rosuvastatin  10 mg daily   Diabetes mellitus (HCC) Continue to follow glucose, will add Lantus if needed   Hypertension Continue metoprolol .  hold off lisinopril  to allow more medicine for heart rate control.   Class I obesity with BMI 31.18. Diet and excise advised.      Subjective:  Patient currently has no complaints.  No short of breath, no chest pain.  No abdominal pain or nausea vomiting.  Physical Exam: Vitals:   01/23/24 0434 01/23/24 0546 01/23/24 0823 01/23/24 1214  BP: 111/74 109/68 127/78 114/64  Pulse: (!) 127 (!) 107 (!) 114 63  Resp: 18 18 16 16   Temp: 98.3 F (36.8 C) 99.6 F (37.6 C) 98.5 F (36.9 C)   TempSrc:  Oral    SpO2: 96% 96% 98% 96%  Weight:      Height:       General exam: Appears calm and comfortable  Respiratory system: Clear to auscultation. Respiratory effort normal. Cardiovascular system: Irregularly irregular and tachycardic. No JVD, murmurs, rubs, gallops or clicks. No pedal edema. Gastrointestinal system: Abdomen is nondistended, soft and nontender. No organomegaly or masses felt. Normal bowel sounds heard. Central nervous system: Alert and oriented. No focal neurological deficits. Extremities: Symmetric 5 x 5 power. Skin: No rashes, lesions or ulcers Psychiatry: Judgement and insight appear normal. Mood & affect appropriate.    Data Reviewed:  Lab results reviewed.  Family Communication: Daughter updated at bedside.  Disposition: Status is: Inpatient Remains inpatient appropriate because: Severity of disease,  IV treatment     Time spent: 35  minutes  Author: Murvin Mana, MD 01/23/2024 2:07 PM  For on call review [email or web address].

## 2024-01-23 NOTE — Plan of Care (Signed)

## 2024-01-23 NOTE — Plan of Care (Signed)
 Dr. Informed of critical lab result of potassium 2.7

## 2024-01-23 NOTE — Consult Note (Addendum)
 ELECTROPHYSIOLOGY CONSULT NOTE    [name] MRN: [id number], DOB/AGE: Sep 22, 1955 68 y.o.  Admit date: 01/16/2024 Date of Consult: 01/23/2024  Primary Physician: Glendia Shad, MD Primary Cardiologist: Lonni Hanson, MD  Electrophysiologist: new to EP Team   Referring Provider: @ATTENDING @  Patient Profile: April Solis is a 68 y.o. female with a history of PAF, HTN, HLD, DM2, glaucoma. She presented to the hospital with epigastric and chest pain, found to have cholelithiasis s/p cholecystectomy complicated by AFib post-op who is being seen today for the evaluation of Afib at the request of AFib.  HPI:  April Solis is a 68 y.o. female with PMH as above.  She presented to the hospital 10/13 with abd pain, found to have 3cm gallstone and undrrwent lap chole on 10/15. Post-procedurally, she converted to afib and was started on dilt gtt. She initially converted to sinus rhythm, but the had recurrence of Afib. She had poor rate control with hypotension, so was converted to IV amiodarone. Rates remained uncontrolled, BB increased.  Eliquis  was restarted 01/21/24.  EP consulted for further mgmt.   Currently, she denies symptoms of afib including palpitations, chest pain, chest pressure, SOB. She questions whether she will have DCCV today     Labs Potassium2.7* (10/20 1037)   Creatinine, ser  0.63 (10/20 1037) PLT  297 (10/20 1037) HGB  12.5 (10/20 1037) WBC 12.3* (10/20 1037)  .    Past Medical History:  Diagnosis Date   Achilles rupture, right    Contact dermatitis and eczema due to plant    Diabetes mellitus without complication (HCC)    Environmental allergies    Glaucoma    History of chicken pox    Hyperlipidemia    Hypertension    Osteopenia    Paroxysmal A-fib (HCC)      Surgical History:  Past Surgical History:  Procedure Laterality Date   ACHILLES TENDON REPAIR Right    BREAST BIOPSY Right 1999   neg    COLONOSCOPY WITH PROPOFOL  N/A 12/12/2017   Procedure: COLONOSCOPY WITH PROPOFOL ;  Surgeon: Viktoria Lamar DASEN, MD;  Location[facility] END[facility]COPY;  Service: Endoscopy;  Laterality: N/A;   COLONOSCOPY WITH PROPOFOL  N/A 06/24/2023   Procedure: COLONOSCOPY WITH PROPOFOL ;  Surgeon: Onita Elspeth Sharper, DO;  Location: [facility] ENDOSCOPY;  Service: Gastroenterology;  Laterality: N/A;  DM, 1ST CASE, PLEASE   TUBAL LIGATION       Medications Prior to Admission  Medication Sig Dispense Refill Last Dose/Taking   amLODipine  (NORVASC ) 5 MG tablet Take 1 tablet (5 mg total) by mouth daily. 90 tablet 3 Past Week   apixaban  (ELIQUIS ) 5 MG TABS tablet Take 1 tablet (5 mg total) by mouth 2 (two) times daily. 180 tablet 3 Past Week   dorzolamide-timolol (COSOPT) 22.3-6.8 MG/ML ophthalmic solution Place 1 drop into both eyes 2 (two) times daily.   Past Week   doxycycline  (ADOXA) 50 MG tablet Take 1-2 tabs PO QD PRN flares 60 tablet 1 Unknown   empagliflozin  (JARDIANCE ) 25 MG TABS tablet Take 1 tablet (25 mg total) by mouth daily. 90 tablet 3 Past Week   finasteride  (PROSCAR ) 5 MG tablet Take 1 tablet (5 mg total) by mouth daily. 90 tablet 3 Past Week   glipiZIDE  (GLUCOTROL  XL) 2.5 MG 24 hr tablet Take 1 tablet (2.5 mg total) by mouth daily with breakfast. 90 tablet 3 Past Week   latanoprost (XALATAN) 0.005 % ophthalmic solution Place 1 drop into both eyes at bedtime.  Past Week   lisinopril  (ZESTRIL ) 10 MG tablet Take 1 tablet (10 mg total) by mouth daily. 90 tablet 3 Past Week   metFORMIN  (GLUCOPHAGE -XR) 500 MG 24 hr tablet TAKE TWO TABLETS BY MOUTH EVERY MORNING AND AT BEDTIME 360 tablet 1 Past Week   metoprolol  tartrate (LOPRESSOR ) 25 MG tablet Take 1 tablet (25 mg total) by mouth 2 (two) times daily. 180 tablet 3 Past Week   minoxidil  (LONITEN ) 2.5 MG tablet Take 1 tablet (2.5 mg total) by mouth daily. 90 tablet 3 Past Week   Multiple Vitamin (MULTIVITAMIN) tablet Take 1 tablet by mouth daily.   Past Week   mupirocin   ointment (BACTROBAN ) 2 % Apply 1 Application topically 2 (two) times daily. Bid to right forehead 22 g 1 Unknown   rosuvastatin  (CRESTOR ) 10 MG tablet TAKE ONE TABLET EVERY DAY 90 tablet 3 Past Week   vitamin E 200 UNIT capsule Take 200 Units by mouth daily.   Past Week   glucose blood (GE100 BLOOD GLUCOSE TEST) test strip TEST TWICE DAILY 100 each 2    timolol (BETIMOL) 0.25 % ophthalmic solution Place 1-2 drops into both eyes daily. (Patient not taking: Reported on 01/21/2024)   Not Taking    Inpatient Medications:   amiodarone  400 mg Oral BID   apixaban   5 mg Oral BID   hyaluronidase Human  150 Units Subcutaneous Once   insulin aspart  0-15 Units Subcutaneous TID WC   insulin aspart  0-5 Units Subcutaneous QHS   metoprolol  tartrate  50 mg Oral BID   potassium chloride  40 mEq Oral Q2H   rosuvastatin   10 mg Oral Daily    Allergies:  Allergies  Allergen Reactions   Influenza Vaccines Other (See Comments)    Flu symptoms two hours after injections   Rubbing Alcohol [Alcohol]    Other Rash    Poison ivy    Family History  Problem Relation Age of Onset   Colon polyps Mother    Alzheimer's disease Mother    Heart attack Father 41   Hypertension Father    Diabetes Father    Hypertension Sister    Diabetes Sister        x2   COPD Sister    Diabetes Sister    Hypertension Sister    Glaucoma Sister    Arrhythmia Son        Faster heart beat than normal   Breast cancer Neg Hx    Colon cancer Neg Hx      Physical Exam: Vitals:   01/23/24 0434 01/23/24 0546 01/23/24 0823 01/23/24 1214  BP: 111/74 109/68 127/78 114/64  Pulse: (!) 127 (!) 107 (!) 114 63  Resp: 18 18 16 16   Temp: 98.3 F (36.8 C) 99.6 F (37.6 C) 98.5 F (36.9 C)   TempSrc:  Oral    SpO2: 96% 96% 98% 96%  Weight:      Height:        GEN- NAD, A&O x 3, normal affect HEENT: Normocephalic, atraumatic Lungs- CTAB, Normal effort.  Heart- irregular rate and rhythm, No M/G/R.  GI- Soft, NT, ND.   Extremities- No clubbing, cyanosis, or edema   Radiology/Studies: CT Angio Chest/Abd/Pel for Dissection W and/or Wo Contrast Result Date: 01/17/2024 CLINICAL DATA:  Acute aortic syndrome (AAS) suspected.  Chest pain EXAM: CT ANGIOGRAPHY CHEST, ABDOMEN AND PELVIS TECHNIQUE: Non-contrast CT of the chest was initially obtained. Multidetector CT imaging through the chest, abdomen and pelvis was performed using the  standard protocol during bolus administration of intravenous contrast. Multiplanar reconstructed images and MIPs were obtained and reviewed to evaluate the vascular anatomy. RADIATION DOSE REDUCTION: This exam was performed according to the departmental dose-optimization program which includes automated exposure control, adjustment of the mA and/or kV according to patient size and/or use of iterative reconstruction technique. CONTRAST:  OMNIPAQUE  IOHEXOL  350 MG/ML SOLN COMPARISON:  05/28/2021 FINDINGS: CTA CHEST FINDINGS Cardiovascular: Heart is normal size. Aorta is normal caliber. No dissection. No filling defects in the pulmonary arteries to suggest pulmonary emboli. Mediastinum/Nodes: No mediastinal, hilar, or axillary adenopathy. Trachea and esophagus are unremarkable. Substernal left thyroid  nodule measures up to 2.3 cm. Lungs/Pleura: Lungs are clear. No focal airspace opacities or suspicious nodules. No effusions. No pneumothorax. Musculoskeletal: Chest wall soft tissues are unremarkable. No acute bony abnormality. Review of the MIP images confirms the above findings. CTA ABDOMEN AND PELVIS FINDINGS VASCULAR Aorta: Normal caliber aorta without aneurysm, dissection, vasculitis or significant stenosis. Celiac: Patent without evidence of aneurysm, dissection, vasculitis or significant stenosis. SMA: Patent without evidence of aneurysm, dissection, vasculitis or significant stenosis. Renals: Both renal arteries are patent without evidence of aneurysm, dissection, vasculitis, fibromuscular  dysplasia or significant stenosis. IMA: Patent without evidence of aneurysm, dissection, vasculitis or significant stenosis. Inflow: Patent without evidence of aneurysm, dissection, vasculitis or significant stenosis. Veins: No obvious venous abnormality within the limitations of this arterial phase study. Review of the MIP images confirms the above findings. NON-VASCULAR Hepatobiliary: 3 cm gallstone within the gallbladder. No CT evidence of acute cholecystitis. Mild low-density throughout the liver compatible with fatty infiltration. No focal hepatic abnormality. Pancreas: No focal abnormality or ductal dilatation. Spleen: No focal abnormality.  Normal size. Adrenals/Urinary Tract: No adrenal abnormality. No focal renal abnormality. No stones or hydronephrosis. Urinary bladder is unremarkable. Stomach/Bowel: Normal appendix. Scattered colonic diverticula. No active diverticulitis. Stomach and small bowel decompressed. Lymphatic: No adenopathy Reproductive: Uterus and adnexa unremarkable.  No mass. Other: No free fluid or free air. Musculoskeletal: No acute bony abnormality. Review of the MIP images confirms the above findings. IMPRESSION: No evidence of aortic aneurysm or dissection. No evidence of pulmonary embolus. No acute findings in the chest, abdomen or pelvis. Cholelithiasis. Scattered colonic diverticulosis. Electronically Signed   By: Franky Crease M.D.   On: 01/17/2024 01:33   DG Chest 2 View Result Date: 01/16/2024 EXAM: 2 VIEW(S) XRAY OF THE CHEST 01/16/2024 10:14:30 PM COMPARISON: None available. CLINICAL HISTORY: cp. PER ER NOTE; Pt reports centralized chest pain that began tonight around 2000, pt reports hx afib. Pt is on blood thinner and metoprolol . Pt also reports some dizziness. FINDINGS: LUNGS AND PLEURA: No focal pulmonary opacity. No pulmonary edema. No pleural effusion. No pneumothorax. HEART AND MEDIASTINUM: No acute abnormality of the cardiac and mediastinal silhouettes. BONES AND SOFT  TISSUES: Degenerative changes of the thoracic spine with mild dextrocurvature. IMPRESSION: 1. No acute cardiopulmonary process. Electronically signed by: Pinkie Pebbles MD 01/16/2024 10:21 PM EDT RP Workstation: [id number]   MM 3D SCREENING MAMMOGRAM BILATERAL BREAST Result Date: 01/12/2024 CLINICAL DATA:  Screening. EXAM: DIGITAL SCREENING BILATERAL MAMMOGRAM WITH TOMOSYNTHESIS AND CAD TECHNIQUE: Bilateral screening digital craniocaudal and mediolateral oblique mammograms were obtained. Bilateral screening digital breast tomosynthesis was performed. The images were evaluated with computer-aided detection. COMPARISON:  Previous exam(s). ACR Breast Density Category c: The breasts are heterogeneously dense, which may obscure small masses. FINDINGS: There are no findings suspicious for malignancy. IMPRESSION: No mammographic evidence of malignancy. A result letter of this screening mammogram will be  mailed directly to the patient. RECOMMENDATION: Screening mammogram in one year. (Code:SM-B-01Y) BI-RADS CATEGORY  1: Negative. Electronically Signed   By: Inocente Ast M.D.   On: 01/12/2024 10:08   DG Bone Density Result Date: 01/10/2024 EXAM: DUAL X-RAY ABSORPTIOMETRY (DXA) FOR BONE MINERAL DENSITY 01/10/2024 9:33 am CLINICAL DATA:  68 year old Female Postmenopausal. Postmenopausal, estrogen deficiency History of fragility fracture. TECHNIQUE: An axial (e.g., hips, spine) and/or appendicular (e.g., radius) exam was performed, as appropriate, using GE Secretary/administrator at [employer]. Images are obtained for bone mineral density measurement and are not obtained for diagnostic purposes. [id number] Exclusions: L4. COMPARISON:  None. New baseline. FINDINGS: Scan quality: Good. LUMBAR SPINE (L1-L3): BMD (in g/cm2): 1.118 T-score: -0.4 Z-score: 1.2 LEFT FEMORAL NECK: BMD (in g/cm2): 0.771 T-score: -1.9 Z-score: -0.3 LEFT TOTAL HIP: BMD (in g/cm2): 0.828 T-score: -1.4 Z-score: -0.1  RIGHT FEMORAL NECK: BMD (in g/cm2): 0.771 T-score: -1.9 Z-score: -0.3 RIGHT TOTAL HIP: BMD (in g/cm2): 0.769 T-score: -1.9 Z-score: -0.5 FRAX 10-YEAR PROBABILITY OF FRACTURE: 10-year fracture risk is performed using the [employer] FRAX calculator based on patient-reported risk factors. Major osteoporotic fracture: 17.2% Hip fracture: 2.8% Other situations known to alter the reliability of the FRAX score should be considered when making treatment decisions, including chronic glucocorticoid use and past treatments. Further guidance on treatment can be found at the [facility] Osteoporosis Foundation's website [email or web address]/. IMPRESSION: Osteopenia based on BMD. Fracture risk is increased. Increased risk is based on low BMD and history of fragility fracture. RECOMMENDATIONS: 1. All patients should optimize calcium  and vitamin D intake. 2. Consider FDA-approved medical therapies in postmenopausal women and men aged 52 years and older, based on the following: - A hip or vertebral (clinical or morphometric) fracture - T-score less than or equal to -2.5 and secondary causes have been excluded. - Low bone mass (T-score between -1.0 and -2.5) and a 10-year probability of a hip fracture greater than or equal to 3% or a 10-year probability of a major osteoporosis-related fracture greater than or equal to 20% based on the [place] -adapted WHO algorithm. - Clinician judgment and/or patient preferences may indicate treatment for people with 10-year fracture probabilities above or below these levels 3. Patients with diagnosis of osteoporosis or at high risk for fracture should have regular bone mineral density tests. For patients eligible for Medicare, routine testing is allowed once every 2 years. The testing frequency can be increased to one year for patients who have rapidly progressing disease, those who are receiving or discontinuing medical therapy to restore bone mass, or have additional risk factors. Electronically  Signed   By: Harrietta Sherry M.D.   On: 01/10/2024 09:55    EKG: 01/19/24 - AFib, rate 111 01/16/24 - SR at 59  (personally reviewed)  TELEMETRY: Afib 90-120s, very brief seconds of sinus rhythm in 60s (personally reviewed)  DEVICE HISTORY: none  Assessment/Plan: #) parox Afib w RVR #) acute cholecystitis s/p cholecystectomy #) hypokalemia History of AF, with very low burden. She presented to hospital in sinus rhythm and converted to AFib post-operatively Initially managed with IV dilt, converted to IV amio iso elevated rates with borderline BP Has continued to have slightly elevated ventricular rates 90-120s, asymptomatic of such Will convert IV amio > PO amio at 400mg  BID Continue 50mg  lopressor  BID, consider uptitration vs adding dig if rates remain elevated  Update CMP, TSH, CBC today  Keep K > 4, Mag > 2 Continue 5mg  eliquis  BID for stroke  PPX         For questions or updates, please contact [facility] Please consult [email or web address] for contact info under Cardiology/STEMI.  Signed, Khali Perella, NP  01/23/2024 12:21 PM

## 2024-01-23 NOTE — TOC CM/SW Note (Signed)
 Transition of Care [facility]) CM/SW Note    Transition of Care [facility]) - Inpatient Brief Assessment   Patient Details  Name: April Solis MRN: [id number] Date of Birth: 1956/03/13  Transition of Care [facility]) CM/SW Contact:    Alfonso Rummer, LCSW Phone Number: 01/23/2024, 11:11 AM   Clinical Narrative:  LCSW A. Valrie Jia completed TOC chart review no needs identified please contact should needs arise.   Transition of Care Asessment: Insurance and Status: Insurance coverage has been reviewed Patient has primary care physician: Yes (SCOTT, CHARLENE) Home environment has been reviewed: single family home   Prior/Current Home Services: No current home services Social Drivers of Health Review: SDOH reviewed no interventions necessary Readmission risk has been reviewed: Yes Transition of care needs: no transition of care needs at this time

## 2024-01-24 ENCOUNTER — Other Ambulatory Visit: Payer: Self-pay

## 2024-01-24 DIAGNOSIS — K8050 Calculus of bile duct without cholangitis or cholecystitis without obstruction: Secondary | ICD-10-CM | POA: Diagnosis not present

## 2024-01-24 DIAGNOSIS — I10 Essential (primary) hypertension: Secondary | ICD-10-CM | POA: Diagnosis not present

## 2024-01-24 DIAGNOSIS — I480 Paroxysmal atrial fibrillation: Secondary | ICD-10-CM | POA: Diagnosis not present

## 2024-01-24 LAB — BASIC METABOLIC PANEL WITH GFR
Anion gap: 13 (ref 5–15)
BUN: 12 mg/dL (ref 8–23)
CO2: 23 mmol/L (ref 22–32)
Calcium: 8.5 mg/dL — ABNORMAL LOW (ref 8.9–10.3)
Chloride: 104 mmol/L (ref 98–111)
Creatinine, Ser: 0.7 mg/dL (ref 0.44–1.00)
GFR, Estimated: >60 mL/min (ref >60)
Glucose, Bld: 163 mg/dL — ABNORMAL HIGH (ref 70–99)
Potassium: 4.6 mmol/L (ref 3.5–5.1)
Sodium: 140 mmol/L (ref 135–145)

## 2024-01-24 LAB — GLUCOSE, CAPILLARY: Glucose-Capillary: 141 mg/dL — ABNORMAL HIGH (ref 70–99)

## 2024-01-24 LAB — MAGNESIUM: Magnesium: 2.2 mg/dL (ref 1.7–2.4)

## 2024-01-24 MED ORDER — POTASSIUM CHLORIDE 20 MEQ PO PACK
40.0000 meq | PACK | Freq: Once | ORAL | Status: AC
  Administered 2024-01-24: 40 meq via ORAL
  Filled 2024-01-24: qty 2

## 2024-01-24 MED ORDER — METOPROLOL TARTRATE 50 MG PO TABS
50.0000 mg | ORAL_TABLET | Freq: Two times a day (BID) | ORAL | 1 refills | Status: DC
  Filled 2024-01-24: qty 90, 45d supply, fill #0

## 2024-01-24 MED ORDER — AMIODARONE HCL 200 MG PO TABS
ORAL_TABLET | ORAL | 1 refills | Status: DC
  Filled 2024-01-24: qty 90, 74d supply, fill #0

## 2024-01-24 NOTE — Progress Notes (Signed)
 EP brief note -    Tele reviewed - longer periods of sinus rhythm with paroxysms of afib in 90-120s.   Patient overall feels well and requests to go home   #) AFib w RVR Continue PO amiodarone load - 400mg  BID x 3 days, then reduce to 400mg  daily x 1 week, then 200mg  daily Continue 5mg  eliquis  BID for stroke ppx Continue 50mg  lopressor  BID Continue 10meQ potassium supplement daily   Outpatient EP follow-up has been scheduled    OK to discharge from EP standpoint.    April Saraceni, NP Electrophysiology 01/24/24 9:17 AM

## 2024-01-24 NOTE — Plan of Care (Signed)

## 2024-01-24 NOTE — TOC Transition Note (Signed)
 Transition of Care [facility]) - Discharge Note   Patient Details  Name: April Solis MRN: [id number] Date of Birth: [birth year]  Transition of Care [facility]) CM/SW Contact:  Alfonso Rummer, LCSW Phone Number: 01/24/2024, 10:14 AM   Clinical Narrative:     Pt is discharging home no toc needs identified. TOC signing off.   Final next level of care: Home/Self Care Barriers to Discharge: Barriers Resolved   Patient Goals and CMS Choice            Discharge Placement               Home    Name of family member notified: nathan Keysor pt's son Patient and family notified of of transfer: 01/24/24  Discharge Plan and Services Additional resources added to the After Visit Summary for                                       Social Drivers of Health (SDOH) Interventions SDOH Screenings   Food Insecurity: No Food Insecurity (01/17/2024)  Housing: Low Risk  (01/17/2024)  Transportation Needs: No Transportation Needs (01/17/2024)  Utilities: Not At Risk (01/17/2024)  Alcohol Screen: Low Risk  (02/22/2023)  Depression (PHQ2-9): Low Risk  (02/22/2023)  Financial Resource Strain: Low Risk  (11/21/2023)  Physical Activity: Insufficiently Active (04/11/2023)  Social Connections: Socially Integrated (01/17/2024)  Stress: No Stress Concern Present (11/21/2023)  Tobacco Use: Low Risk  (01/16/2024)  Health Literacy: Adequate Health Literacy (02/22/2023)     Readmission Risk Interventions     No data to display

## 2024-01-24 NOTE — Discharge Summary (Addendum)
 Physician Discharge Summary   Patient: April Solis MRN: [id number] DOB: [birth year]  Admit date:     01/16/2024  Discharge date: 01/24/24  Discharge Physician: Murvin Mana   PCP: Glendia Shad, MD   Recommendations at discharge:   Follow-up with PCP in 1 week. Follow-up with cardiology in 2 weeks. Follow-up with general surgery as scheduled.  Discharge Diagnoses: Principal Problem:   Biliary colic Active Problems:   Hypertension   Diabetes mellitus (HCC)   Hyperlipidemia associated with type 2 diabetes mellitus (HCC)   Paroxysmal atrial fibrillation with rapid ventricular response (HCC)   Nonspecific chest pain   Calculus of gallbladder without cholecystitis without obstruction   Mixed hyperlipidemia  Resolved Problems:   * No resolved hospital problems. [facility] Course: April Solis is a 68 year old female with history of hypertension, hyperlipidemia, paroxysmal atrial fibrillation on Eliquis , non-insulin-dependent diabetes mellitus, who presents ED for chief concerns of epigastric pain and centralized chest pain and dizziness. CT scan showed gallstone, patient has biliary colic. Patient was started started on Zosyn, cholecystectomy was performed on 10/15, gallbladder appeared inflated. Postoperatively, patient converted to atrial fibrillation with RVR.  Started on diltiazem. Atrial fibrillation did not convert on 10/16, switched to amiodarone and heparin, cardiology consulted.  Decision was made for rate control only, Eliquis  was restarted on 10/18 after discussion with general surgery. Also followed by cardiology/EP, medication adjusted, heart rate better controlled, decision is made not to cardiovert, EP has cleared patient for discharge.  Assessment and Plan:  Paroxysmal atrial fibrillation with rapid ventricular response (HCC) Patient developed significant tachycardia after surgery, heart rate went up to 130s, diltiazem drip was started. Patient  continued to have atrial fibrillation on 10/16, changed to amiodarone and heparin.  Per cardiology, no plan for cardioversion, heparin discontinued.  Eliquis  restarted.  Amiodarone drip completed course. Metoprolol  50 mg twice a day per cardiology.  Digoxin discontinued. Patient has been seen by EP, decision was made to continue medical treatment with oral metoprolol , amiodarone and Eliquis .  EP has cleared patient for discharge.  She will follow-up with EP/cardiology as outpatient.   Hypokalemia. Potassium normalized     * Biliary colic Acute cholecystitis with cholelithiasis ruled in, POA Patient is status post cholecystectomy, gallbladder appeared to be inflated during surgery. Patient has a confirmed cholecystitis. Per general surgery, patient need to complete 3 doses of Zosyn, then discontinue.  No additional need for Zosyn. Condition resolved.   Hyperlipidemia associated with type 2 diabetes mellitus (HCC) Rosuvastatin  10 mg daily   Diabetes mellitus (HCC) Resume home medicines   Hypertension She will be on metoprolol , discontinued all other blood pressure medicine, blood pressure is not elevated   Class I obesity with BMI 31.18. Diet and excise advised.        Consultants: General surgery, cardiology Procedures performed: Cholecystectomy Disposition: Home Diet recommendation:  Discharge Diet Orders (From admission, onward)     Start     Ordered   01/24/24 0000  Diet - low sodium heart healthy        01/24/24 0955           Cardiac diet DISCHARGE MEDICATION: Allergies as of 01/24/2024       Reactions   Influenza Vaccines Other (See Comments)   Flu symptoms two hours after injections   Rubbing Alcohol [alcohol]    Other Rash   Poison ivy        Medication List     STOP taking these medications    amLODipine   5 MG tablet Commonly known as: NORVASC    doxycycline  50 MG tablet Commonly known as: ADOXA   lisinopril  10 MG tablet Commonly known as:  ZESTRIL    minoxidil  2.5 MG tablet Commonly known as: LONITEN    timolol 0.25 % ophthalmic solution Commonly known as: BETIMOL       TAKE these medications    amiodarone 200 MG tablet Commonly known as: PACERONE Take 2 tablets (400 mg total) by mouth 2 (two) times daily for 3 days, THEN 2 tablets (400 mg total) daily for 7 days, THEN 1 tablet (200 mg total) daily. Start taking on: January 24, 2024   apixaban  5 MG Tabs tablet Commonly known as: Eliquis  Take 1 tablet (5 mg total) by mouth 2 (two) times daily.   dorzolamide-timolol 2-0.5 % ophthalmic solution Commonly known as: COSOPT Place 1 drop into both eyes 2 (two) times daily.   empagliflozin  25 MG Tabs tablet Commonly known as: Jardiance  Take 1 tablet (25 mg total) by mouth daily.   finasteride  5 MG tablet Commonly known as: PROSCAR  Take 1 tablet (5 mg total) by mouth daily.   GE100 Blood Glucose Test test strip Generic drug: glucose blood TEST TWICE DAILY   glipiZIDE  2.5 MG 24 hr tablet Commonly known as: GLUCOTROL  XL Take 1 tablet (2.5 mg total) by mouth daily with breakfast.   latanoprost 0.005 % ophthalmic solution Commonly known as: XALATAN Place 1 drop into both eyes at bedtime.   metFORMIN  500 MG 24 hr tablet Commonly known as: GLUCOPHAGE -XR TAKE TWO TABLETS BY MOUTH EVERY MORNING AND AT BEDTIME   metoprolol  tartrate 50 MG tablet Commonly known as: LOPRESSOR  Take 1 tablet (50 mg total) by mouth 2 (two) times daily. What changed:  medication strength how much to take   multivitamin tablet Take 1 tablet by mouth daily.   mupirocin  ointment 2 % Commonly known as: BACTROBAN  Apply 1 Application topically 2 (two) times daily. Bid to right forehead   rosuvastatin  10 MG tablet Commonly known as: CRESTOR  TAKE ONE TABLET EVERY DAY   vitamin E 200 UNIT capsule Take 200 Units by mouth daily.               Discharge Care Instructions  (From admission, onward)           Start      Ordered   01/24/24 0000  Discharge wound care:       Comments: General surgery follows the surgical site   01/24/24 9044            Follow-up Information     Marinda Jayson KIDD, MD. Go on 02/02/2024.   Specialty: General Surgery Why: Go to appointment on 10/30 at 145 PM Contact information: [address] [place] [address] [phone]         Cindie Ole DASEN, MD Follow up in 2 week(s).   Specialties: Cardiology, Radiology Contact information: [address] [place] [address] [phone]         Glendia Shad, MD Follow up in 1 week(s).   Specialty: Internal Medicine Contact information: [address] [place] [address] [phone]                Discharge Exam: Fredricka Weights   01/16/24 2145  Weight: 74.8 kg   General exam: Appears calm and comfortable  Respiratory system: Clear to auscultation. Respiratory effort normal. Cardiovascular system: Irregular. No JVD, murmurs, rubs, gallops or clicks. No pedal edema. Gastrointestinal system: Abdomen is  nondistended, soft and nontender. No organomegaly or masses felt. Normal bowel sounds heard. Central nervous system: Alert and oriented. No focal neurological deficits. Extremities: Symmetric 5 x 5 power. Skin: No rashes, lesions or ulcers Psychiatry: Judgement and insight appear normal. Mood & affect appropriate.    Condition at discharge: good  The results of significant diagnostics from this hospitalization (including imaging, microbiology, ancillary and laboratory) are listed below for reference.   Imaging Studies: CT Angio Chest/Abd/Pel for Dissection W and/or Wo Contrast Result Date: 01/17/2024 CLINICAL DATA:  Acute aortic syndrome (AAS) suspected.  Chest pain EXAM: CT ANGIOGRAPHY CHEST, ABDOMEN AND PELVIS TECHNIQUE: Non-contrast CT of the chest was initially obtained. Multidetector CT imaging through the chest, abdomen and pelvis was  performed using the standard protocol during bolus administration of intravenous contrast. Multiplanar reconstructed images and MIPs were obtained and reviewed to evaluate the vascular anatomy. RADIATION DOSE REDUCTION: This exam was performed according to the departmental dose-optimization program which includes automated exposure control, adjustment of the mA and/or kV according to patient size and/or use of iterative reconstruction technique. CONTRAST:  OMNIPAQUE  IOHEXOL  350 MG/ML SOLN COMPARISON:  05/28/2021 FINDINGS: CTA CHEST FINDINGS Cardiovascular: Heart is normal size. Aorta is normal caliber. No dissection. No filling defects in the pulmonary arteries to suggest pulmonary emboli. Mediastinum/Nodes: No mediastinal, hilar, or axillary adenopathy. Trachea and esophagus are unremarkable. Substernal left thyroid  nodule measures up to 2.3 cm. Lungs/Pleura: Lungs are clear. No focal airspace opacities or suspicious nodules. No effusions. No pneumothorax. Musculoskeletal: Chest Solis soft tissues are unremarkable. No acute bony abnormality. Review of the MIP images confirms the above findings. CTA ABDOMEN AND PELVIS FINDINGS VASCULAR Aorta: Normal caliber aorta without aneurysm, dissection, vasculitis or significant stenosis. Celiac: Patent without evidence of aneurysm, dissection, vasculitis or significant stenosis. SMA: Patent without evidence of aneurysm, dissection, vasculitis or significant stenosis. Renals: Both renal arteries are patent without evidence of aneurysm, dissection, vasculitis, fibromuscular dysplasia or significant stenosis. IMA: Patent without evidence of aneurysm, dissection, vasculitis or significant stenosis. Inflow: Patent without evidence of aneurysm, dissection, vasculitis or significant stenosis. Veins: No obvious venous abnormality within the limitations of this arterial phase study. Review of the MIP images confirms the above findings. NON-VASCULAR Hepatobiliary: 3 cm gallstone  within the gallbladder. No CT evidence of acute cholecystitis. Mild low-density throughout the liver compatible with fatty infiltration. No focal hepatic abnormality. Pancreas: No focal abnormality or ductal dilatation. Spleen: No focal abnormality.  Normal size. Adrenals/Urinary Tract: No adrenal abnormality. No focal renal abnormality. No stones or hydronephrosis. Urinary bladder is unremarkable. Stomach/Bowel: Normal appendix. Scattered colonic diverticula. No active diverticulitis. Stomach and small bowel decompressed. Lymphatic: No adenopathy Reproductive: Uterus and adnexa unremarkable.  No mass. Other: No free fluid or free air. Musculoskeletal: No acute bony abnormality. Review of the MIP images confirms the above findings. IMPRESSION: No evidence of aortic aneurysm or dissection. No evidence of pulmonary embolus. No acute findings in the chest, abdomen or pelvis. Cholelithiasis. Scattered colonic diverticulosis. Electronically Signed   By: Franky Crease M.D.   On: 01/17/2024 01:33   DG Chest 2 View Result Date: 01/16/2024 EXAM: 2 VIEW(S) XRAY OF THE CHEST 01/16/2024 10:14:30 PM COMPARISON: None available. CLINICAL HISTORY: cp. PER ER NOTE; Pt reports centralized chest pain that began tonight around 2000, pt reports hx afib. Pt is on blood thinner and metoprolol . Pt also reports some dizziness. FINDINGS: LUNGS AND PLEURA: No focal pulmonary opacity. No pulmonary edema. No pleural effusion. No pneumothorax. HEART AND MEDIASTINUM: No acute abnormality of  the cardiac and mediastinal silhouettes. BONES AND SOFT TISSUES: Degenerative changes of the thoracic spine with mild dextrocurvature. IMPRESSION: 1. No acute cardiopulmonary process. Electronically signed by: Pinkie Pebbles MD 01/16/2024 10:21 PM EDT RP Workstation: [id number]   MM 3D SCREENING MAMMOGRAM BILATERAL BREAST Result Date: 01/12/2024 CLINICAL DATA:  Screening. EXAM: DIGITAL SCREENING BILATERAL MAMMOGRAM WITH TOMOSYNTHESIS AND CAD TECHNIQUE:  Bilateral screening digital craniocaudal and mediolateral oblique mammograms were obtained. Bilateral screening digital breast tomosynthesis was performed. The images were evaluated with computer-aided detection. COMPARISON:  Previous exam(s). ACR Breast Density Category c: The breasts are heterogeneously dense, which may obscure small masses. FINDINGS: There are no findings suspicious for malignancy. IMPRESSION: No mammographic evidence of malignancy. A result letter of this screening mammogram will be mailed directly to the patient. RECOMMENDATION: Screening mammogram in one year. (Code:[id number]-01Y) BI-RADS CATEGORY  1: Negative. Electronically Signed   By: Inocente Ast M.D.   On: 01/12/2024 10:08   DG Bone Density Result Date: 01/10/2024 EXAM: DUAL X-RAY ABSORPTIOMETRY (DXA) FOR BONE MINERAL DENSITY 01/10/2024 9:33 am CLINICAL DATA:  69 year old Female Postmenopausal. Postmenopausal, estrogen deficiency History of fragility fracture. TECHNIQUE: An axial (e.g., hips, spine) and/or appendicular (e.g., radius) exam was performed, as appropriate, using GE Secretary/administrator at [employer]. Images are obtained for bone mineral density measurement and are not obtained for diagnostic purposes. [id number] Exclusions: L4. COMPARISON:  None. New baseline. FINDINGS: Scan quality: Good. LUMBAR SPINE (L1-L3): BMD (in g/cm2): 1.118 T-score: -0.4 Z-score: 1.2 LEFT FEMORAL NECK: BMD (in g/cm2): 0.771 T-score: -1.9 Z-score: -0.3 LEFT TOTAL HIP: BMD (in g/cm2): 0.828 T-score: -1.4 Z-score: -0.1 RIGHT FEMORAL NECK: BMD (in g/cm2): 0.771 T-score: -1.9 Z-score: -0.3 RIGHT TOTAL HIP: BMD (in g/cm2): 0.769 T-score: -1.9 Z-score: -0.5 FRAX 10-YEAR PROBABILITY OF FRACTURE: 10-year fracture risk is performed using the [employer] FRAX calculator based on patient-reported risk factors. Major osteoporotic fracture: 17.2% Hip fracture: 2.8% Other situations known to alter the reliability of the  FRAX score should be considered when making treatment decisions, including chronic glucocorticoid use and past treatments. Further guidance on treatment can be found at the [facility]opor[facility] Foundation's website https://www.patton.com/. IMPRESSION: Osteopenia based on BMD. Fracture risk is increased. Increased risk is based on low BMD and history of fragility fracture. RECOMMENDATIONS: 1. All patients should optimize calcium  and vitamin D intake. 2. Consider FDA-approved medical therapies in postmenopausal women and men aged 21 years and older, based on the following: - A hip or vertebral (clinical or morphometric) fracture - T-score less than or equal to -2.5 and secondary causes have been excluded. - Low bone mass (T-score between -1.0 and -2.5) and a 10-year probability of a hip fracture greater than or equal to 3% or a 10-year probability of a major osteoporosis-related fracture greater than or equal to 20% based on the [place] -adapted WHO algorithm. - Clinician judgment and/or patient preferences may indicate treatment for people with 10-year fracture probabilities above or below these levels 3. Patients with diagnosis of osteoporosis or at high risk for fracture should have regular bone mineral density tests. For patients eligible for Medicare, routine testing is allowed once every 2 years. The testing frequency can be increased to one year for patients who have rapidly progressing disease, those who are receiving or discontinuing medical therapy to restore bone mass, or have additional risk factors. Electronically Signed   By: Harrietta Sherry M.D.   On: 01/10/2024 09:55    Microbiology: Results for orders placed or performed  in visit on 08/08/23  Aerobic culture     Status: Abnormal   Collection Time: 08/08/23  8:50 AM   Specimen: Skin, Other   Skin  Result Value Ref Range Status   Aerobic Bacterial Culture Final report (A)  Final   Organism ID, Bacteria Comment (A)  Final    Comment: Methicillin -  resistant Staphylococcus aureus Based on resistance to oxacillin this isolate would be resistant to all currently available beta-lactam antimicrobial agents, with the exception of the newer cephalosporins with anti-MRSA activity, such as Ceftaroline Heavy growth    Antimicrobial Susceptibility Comment  Final    Comment:       ** S = Susceptible; I = Intermediate; R = Resistant **                    P = Positive; N = Negative             MICS are expressed in micrograms per mL    Antibiotic                 RSLT#1    RSLT#2    RSLT#3    RSLT#4 Ciprofloxacin                  R Clindamycin                    S Erythromycin                   R Gentamicin                     S Levofloxacin                   I Linezolid                      S Oxacillin                      R Penicillin                     R Rifampin                       S Tetracycline                   S Trimethoprim/Sulfa             S Vancomycin                     S     Labs: CBC: Recent Labs  Lab 01/18/24 0556 01/20/24 0438 01/23/24 1037  WBC 16.8* 10.9* 12.3*  HGB 14.6 12.6 12.5  HCT 43.2 38.4 38.2  MCV 90.2 92.8 90.3  PLT 181 160 297   Basic Metabolic Panel: Recent Labs  Lab 01/18/24 0556 01/23/24 1037 01/24/24 0536  NA 135 139 140  K 3.7 2.7* 4.6  CL 99 101 104  CO2 24 26 23   GLUCOSE 161* 205* 163*  BUN 11 8 12   CREATININE 0.96 0.63 0.70  CALCIUM  8.6* 8.0* 8.5*  MG  --   --  2.2   Liver Function Tests: Recent Labs  Lab 01/23/24 1037  AST 32  ALT 59*  ALKPHOS 100  BILITOT 0.5  PROT 6.4*  ALBUMIN 2.4*   CBG: Recent Labs  Lab 01/23/24 0818 01/23/24 1121 01/23/24 1751 01/23/24  1948 01/24/24 0820  GLUCAP 180* 172* 154* 165* 141*    Discharge time spent: 35 minutes.  Signed: Murvin Mana, MD [facility] 01/24/2024

## 2024-01-24 NOTE — Progress Notes (Addendum)
 Pt and son given dc/rx/sx site care instructions. Pt/so advised to return to the ED as needed/worse. Pt with a female visitor at bedside. Pt visiting and getting dressed. No distress noted with pt. Pt to call when she is ready to go. Pt advised to gather all her belongings/valuables.

## 2024-01-25 ENCOUNTER — Telehealth: Payer: Self-pay

## 2024-01-25 NOTE — Patient Instructions (Signed)
 Visit Information  Thank you for taking time to visit with me today. Please don't hesitate to contact me if I can be of assistance to you.  Patient instructions: monitor incision for signs of infection.  follow provider instructions regarding not lifting anything more than 5 lbs and having minimal activity.   notify provider of any new/ ongoing symptoms, keep follow up appointment and taker her medications as prescribed.    Patient verbalizes understanding of instructions and care plan provided today and agrees to view in MyChart. Active MyChart status and patient understanding of how to access instructions and care plan via MyChart confirmed with patient.     The patient has been provided with contact information for the care management team and has been advised to call with any health related questions or concerns.   Please call the care guide team at [phone] if you need to cancel or reschedule your appointment.   Please call the Suicide and Crisis Lifeline: 988 call the [place]  National Suicide Prevention Lifeline: (737)639-8368 or TTY: 313 526 0718 TTY (819)610-4324) to talk to a trained counselor call 1-800-273-TALK (toll free, 24 hour hotline) if you are experiencing a Mental Health or Behavioral Health Crisis or need someone to talk to.  Arvin Seip RN, BSN, CCM [employer], Population Health Case Manager Phone: [phone]

## 2024-01-25 NOTE — Transitions of Care (Post Inpatient/ED Visit) (Signed)
 01/25/2024  Name: April Solis MRN: [id number] DOB: 1955/05/25  Today's TOC FU Call Status: Today's TOC FU Call Status:: Successful TOC FU Call Completed TOC FU Call Complete Date: 01/25/24 Patient's Name and Date of Birth confirmed.  Transition Care Management Follow-up Telephone Call Date of Discharge: 01/24/24 Discharge Facility: [facility]) Type of Discharge: Inpatient Admission Primary Inpatient Discharge Diagnosis:: biliary colic/ cholecystectomy How have you been since you were released from the hospital?: Better Any questions or concerns?: No  Items Reviewed: Did you receive and understand the discharge instructions provided?: Yes Medications obtained,verified, and reconciled?: Yes (Medications Reviewed) Any new allergies since your discharge?: No Dietary orders reviewed?: Yes Type of Diet Ordered:: low salt heart healthy Do you have support at home?: Yes People in Home [RPT]: spouse Name of Support/Comfort Primary Source: April Solis  Medications Reviewed Today: Medications Reviewed Today     Reviewed by Isyss Espinal E, RN (Registered Nurse) on 01/25/24 at 1022  Med List Status: <None>   Medication Order Taking? Sig Documenting Provider Last Dose Status Informant  amiodarone (PACERONE) 200 MG tablet [id number] Yes Take 2 tablets (400 mg total) by mouth 2 (two) times daily for 3 days, THEN 2 tablets (400 mg total) daily for 7 days, THEN 1 tablet (200 mg total) daily. Riddle, Suzann, NP  Active   apixaban  (ELIQUIS ) 5 MG TABS tablet [id number] Yes Take 1 tablet (5 mg total) by mouth 2 (two) times daily. Glendia Shad, MD  Active Self, Pharmacy Records  dorzolamide-timolol (COSOPT) 22.3-6.8 MG/ML ophthalmic solution [id number] Yes Place 1 drop into both eyes 2 (two) times daily. [provider]  Active Self, Pharmacy Records           Med Note [facility] [place], LEOTIS LITTIE Schaumann Feb 05, 2021  9:31 AM)    empagliflozin   (JARDIANCE ) 25 MG TABS tablet [id number] Yes Take 1 tablet (25 mg total) by mouth daily. Glendia Shad, MD  Active Self, Pharmacy Records  finasteride  (PROSCAR ) 5 MG tablet [id number] Yes Take 1 tablet (5 mg total) by mouth daily. Jackquline Sawyer, MD  Active Self, Pharmacy Records  glipiZIDE  (GLUCOTROL  XL) 2.5 MG 24 hr tablet [id number] Yes Take 1 tablet (2.5 mg total) by mouth daily with breakfast. Glendia Shad, MD  Active Self, Pharmacy Records  glucose blood (GE100 BLOOD GLUCOSE TEST) test strip [id number] Yes TEST TWICE DAILY Glendia Shad, MD  Active Self, Pharmacy Records  latanoprost (XALATAN) 0.005 % ophthalmic solution [id number] Yes Place 1 drop into both eyes at bedtime. [provider]  Active Self, Pharmacy Records  metFORMIN  (GLUCOPHAGE -XR) 500 MG 24 hr tablet [id number] Yes TAKE TWO TABLETS BY MOUTH EVERY MORNING AND AT BEDTIME Glendia Shad, MD  Active Self, Pharmacy Records  metoprolol  tartrate (LOPRESSOR ) 50 MG tablet [id number] Yes Take 1 tablet (50 mg total) by mouth 2 (two) times daily. Riddle, Suzann, NP  Active   Multiple Vitamin (MULTIVITAMIN) tablet [id number] Yes Take 1 tablet by mouth daily. [provider]  Active Self, Pharmacy Records  mupirocin  ointment (BACTROBAN ) 2 % [id number]  Apply 1 Application topically 2 (two) times daily. Bid to right forehead  Patient not taking: Reported on 01/25/2024   Jackquline Sawyer, MD  Active Self, Pharmacy Records  rosuvastatin  (CRESTOR ) 10 MG tablet [id number] Yes TAKE ONE TABLET EVERY DAY Glendia Shad, MD  Active Self, Pharmacy Records  vitamin E 200 UNIT capsule [id number] Yes Take 200 Units by mouth daily. [provider]  Active Self, Pharmacy Records  Home Care and Equipment/Supplies: Were Home Health Services Ordered?: No Any new equipment or medical supplies ordered?: No  Functional Questionnaire: Do you need assistance with bathing/showering or dressing?: Yes Do you need assistance  with meal preparation?: Yes Do you need assistance with eating?: No Do you have difficulty maintaining continence: No Do you need assistance with getting out of bed/getting out of a chair/moving?: No Do you have difficulty managing or taking your medications?: No  Follow up appointments reviewed: PCP Follow-up appointment confirmed?: Yes Date of PCP follow-up appointment?: 02/06/24 Follow-up Provider: Dr. Allena Hamilton Specialist [facility] Follow-up appointment confirmed?: Yes Date of Specialist follow-up appointment?: 02/02/24 Follow-Up Specialty Provider:: Dr.Samuel Marinda Do you need transportation to your follow-up appointment?: No Do you understand care options if your condition(s) worsen?: Yes-patient verbalized understanding  SDOH Interventions Today    Flowsheet Row Most Recent Value  SDOH Interventions   Food Insecurity Interventions Intervention Not Indicated  Housing Interventions Intervention Not Indicated  Transportation Interventions Intervention Not Indicated  Utilities Interventions Intervention Not Indicated   Discussed and offered 30 day TOC program.  Patient declined.  The patient has been provided with contact information for the care management team and has been advised to call with any health -related questions or concerns.  The patient verbalized understanding with current plan of care.  The patient is directed to their insurance card regarding availability of benefits coverage.    Arvin Seip RN, BSN, CCM [employer], Population Health Case Manager Phone: [phone]

## 2024-02-02 ENCOUNTER — Encounter: Payer: Self-pay | Admitting: General Surgery

## 2024-02-02 ENCOUNTER — Ambulatory Visit (INDEPENDENT_AMBULATORY_CARE_PROVIDER_SITE_OTHER): Admitting: General Surgery

## 2024-02-02 VITALS — BP 102/65 | HR 52 | Ht 62.0 in | Wt 161.0 lb

## 2024-02-02 DIAGNOSIS — K8020 Calculus of gallbladder without cholecystitis without obstruction: Secondary | ICD-10-CM

## 2024-02-02 DIAGNOSIS — Z09 Encounter for follow-up examination after completed treatment for conditions other than malignant neoplasm: Secondary | ICD-10-CM

## 2024-02-02 NOTE — Patient Instructions (Addendum)
 You may massage your incisions if you like, this will help the areas flatten out.  May rub Vitamin-E oil, cocoa butter, or other emmolient agent in area 2-3 times a day to soften. You will need to use sunscreen for the next year on the area to minimize altered pigmentation of the site.  Do let us  know if you continue to have diarrhea in about 1-2 months.   Follow-up with our office as needed.  Please call and ask to speak with a nurse if you develop questions or concerns.   GENERAL POST-OPERATIVE PATIENT INSTRUCTIONS   WOUND CARE INSTRUCTIONS: Try to keep the wound dry and avoid ointments on the wound unless directed to do so.  If the wound becomes bright red and painful or starts to drain infected material that is not clear, please contact your physician immediately.  If the wound is mildly pink and has a thick firm ridge underneath it, this is normal, and is referred to as a healing ridge.  This will resolve over the next 4-6 weeks.  BATHING: You may shower if you have been informed of this by your surgeon. However, Please do not submerge in a tub, hot tub, or pool until incisions are completely sealed or have been told by your surgeon that you may do so.  DIET:  You may eat any foods that you can tolerate.  It is a good idea to eat a high fiber diet and take in plenty of fluids to prevent constipation.  If you do become constipated you may want to take a mild laxative or take ducolax tablets on a daily basis until your bowel habits are regular.  Constipation can be very uncomfortable, along with straining, after recent surgery.  ACTIVITY: You may want to hug a pillow when coughing and sneezing to add additional support to the surgical area, if you had abdominal or chest surgery, which will decrease pain during these times.  You are encouraged to walk and engage in light activity for the next two weeks.  You should not lift more than 20 pounds for 6 weeks total after surgery as it could put  you at increased risk for complications.  Twenty pounds is roughly equivalent to a plastic bag of groceries. At that time- Listen to your body when lifting, if you have pain when lifting, stop and then try again in a few days. Soreness after doing exercises or activities of daily living is normal as you get back in to your normal routine.  MEDICATIONS:  Try to take narcotic medications and anti-inflammatory medications, such as tylenol, ibuprofen, naprosyn, etc., with food.  This will minimize stomach upset from the medication.  Should you develop nausea and vomiting from the pain medication, or develop a rash, please discontinue the medication and contact your physician.  You should not drive, make important decisions, or operate machinery when taking narcotic pain medication.  SUNBLOCK Use sun block to incision area over the next year if this area will be exposed to sun. This helps decrease scarring and will allow you avoid a permanent darkened area over your incision.  QUESTIONS:  Please feel free to call our office if you have any questions, and we will be glad to assist you. [phone]

## 2024-02-02 NOTE — Progress Notes (Signed)
 Outpatient Surgical Follow Up  02/02/2024  April Solis is an 68 y.o. female.   Chief Complaint  Patient presents with   Routine Post Op    HPI: Patient returns today status post robotic assisted cholecystectomy.  She reports doing well.  She still has a little bit of pain in the right upper quadrant.  She denies any nausea or vomiting.  She does report that she is tolerating a diet but will have some runny stools about 30 minutes after eating.  She denies any erythema or drainage from her incisions.  Past Medical History:  Diagnosis Date   Achilles rupture, right    Contact dermatitis and eczema due to plant    Diabetes mellitus without complication (HCC)    Environmental allergies    Glaucoma    History of chicken pox    Hyperlipidemia    Hypertension    Osteopenia    Paroxysmal A-fib (HCC)     Past Surgical History:  Procedure Laterality Date   ACHILLES TENDON REPAIR Right    BREAST BIOPSY Right 1999   neg   CHOLECYSTECTOMY     COLONOSCOPY WITH PROPOFOL  N/A 12/12/2017   Procedure: COLONOSCOPY WITH PROPOFOL ;  Surgeon: Viktoria Lamar DASEN, MD;  Location: [facility] ENDOSCOPY;  Service: Endoscopy;  Laterality: N/A;   COLONOSCOPY WITH PROPOFOL  N/A 06/24/2023   Procedure: COLONOSCOPY WITH PROPOFOL ;  Surgeon: Onita Elspeth Sharper, DO;  Location: [facility] ENDOSCOPY;  Service: Gastroenterology;  Laterality: N/A;  DM, 1ST CASE, PLEASE   TUBAL LIGATION      Family History  Problem Relation Age of Onset   Colon polyps Mother    Alzheimer's disease Mother    Heart attack Father 80   Hypertension Father    Diabetes Father    Hypertension Sister    Diabetes Sister        x2   COPD Sister    Diabetes Sister    Hypertension Sister    Glaucoma Sister    Arrhythmia Son        Faster heart beat than normal   Breast cancer Neg Hx    Colon cancer Neg Hx     Social History:  reports that she has never smoked. She has never been exposed to tobacco smoke. She has never used  smokeless tobacco. She reports that she does not drink alcohol and does not use drugs.  Allergies:  Allergies  Allergen Reactions   Influenza Vaccines Other (See Comments)    Flu symptoms two hours after injections   Rubbing Alcohol [Alcohol]    Other Rash    Poison ivy    Medications reviewed.    ROS Full ROS performed and is otherwise negative other than what is stated in HPI   BP 102/65   Pulse (!) 52   Ht 5' 2 (1.575 m)   Wt 161 lb (73 kg)   LMP 02/26/2008   SpO2 96%   BMI 29.45 kg/m   Physical Exam Abdomen soft, nontender and nondistended, robotic port sites are healing well with surgical glue on them.   Pathology consistent with acute on chronic cholecystitis Assessment/Plan: Patient status post robotic assisted cholecystectomy.  Doing well.  I discussed with her that if she continues to have diarrhea about 6 months after the operation then can talk about any bile salt replacement but I am hopeful that it will improve on its own.  Continue lifting restrictions for 2 more weeks.  She can now submerge the wounds in water.  She  can follow-up with us  as needed  Jayson Endow, M.D. [facility]

## 2024-02-06 ENCOUNTER — Ambulatory Visit: Admitting: Internal Medicine

## 2024-02-06 ENCOUNTER — Encounter: Payer: Self-pay | Admitting: Internal Medicine

## 2024-02-06 VITALS — BP 100/50 | HR 52 | Temp 97.8°F | Ht 62.0 in | Wt 155.5 lb

## 2024-02-06 DIAGNOSIS — E785 Hyperlipidemia, unspecified: Secondary | ICD-10-CM | POA: Diagnosis not present

## 2024-02-06 DIAGNOSIS — R945 Abnormal results of liver function studies: Secondary | ICD-10-CM

## 2024-02-06 DIAGNOSIS — D72829 Elevated white blood cell count, unspecified: Secondary | ICD-10-CM | POA: Diagnosis not present

## 2024-02-06 DIAGNOSIS — R195 Other fecal abnormalities: Secondary | ICD-10-CM | POA: Diagnosis not present

## 2024-02-06 DIAGNOSIS — E1169 Type 2 diabetes mellitus with other specified complication: Secondary | ICD-10-CM | POA: Diagnosis not present

## 2024-02-06 DIAGNOSIS — I4891 Unspecified atrial fibrillation: Secondary | ICD-10-CM

## 2024-02-06 DIAGNOSIS — E1165 Type 2 diabetes mellitus with hyperglycemia: Secondary | ICD-10-CM

## 2024-02-06 DIAGNOSIS — Z7984 Long term (current) use of oral hypoglycemic drugs: Secondary | ICD-10-CM | POA: Diagnosis not present

## 2024-02-06 DIAGNOSIS — R7989 Other specified abnormal findings of blood chemistry: Secondary | ICD-10-CM | POA: Insufficient documentation

## 2024-02-06 DIAGNOSIS — I10 Essential (primary) hypertension: Secondary | ICD-10-CM

## 2024-02-06 DIAGNOSIS — F439 Reaction to severe stress, unspecified: Secondary | ICD-10-CM

## 2024-02-06 LAB — CBC WITH DIFFERENTIAL/PLATELET
Basophils Absolute: 0 K/uL (ref 0.0–0.1)
Basophils Relative: 0.6 % (ref 0.0–3.0)
Eosinophils Absolute: 0.1 K/uL (ref 0.0–0.7)
Eosinophils Relative: 1 % (ref 0.0–5.0)
HCT: 37.9 % (ref 36.0–46.0)
Hemoglobin: 12.1 g/dL (ref 12.0–15.0)
Lymphocytes Relative: 12.8 % (ref 12.0–46.0)
Lymphs Abs: 1.1 K/uL (ref 0.7–4.0)
MCHC: 31.9 g/dL (ref 30.0–36.0)
MCV: 91.6 fl (ref 78.0–100.0)
Monocytes Absolute: 0.8 K/uL (ref 0.1–1.0)
Monocytes Relative: 9.9 % (ref 3.0–12.0)
Neutro Abs: 6.5 K/uL (ref 1.4–7.7)
Neutrophils Relative %: 75.7 % (ref 43.0–77.0)
Platelets: 501 K/uL — ABNORMAL HIGH (ref 150.0–400.0)
RBC: 4.13 Mil/uL (ref 3.87–5.11)
RDW: 15 % (ref 11.5–15.5)
WBC: 8.6 K/uL (ref 4.0–10.5)

## 2024-02-06 LAB — HEPATIC FUNCTION PANEL
ALT: 46 U/L — ABNORMAL HIGH (ref 0–35)
AST: 35 U/L (ref 0–37)
Albumin: 3.8 g/dL (ref 3.5–5.2)
Alkaline Phosphatase: 84 U/L (ref 39–117)
Bilirubin, Direct: 0.1 mg/dL (ref 0.0–0.3)
Total Bilirubin: 0.5 mg/dL (ref 0.2–1.2)
Total Protein: 7.5 g/dL (ref 6.0–8.3)

## 2024-02-06 LAB — BASIC METABOLIC PANEL WITH GFR
BUN: 17 mg/dL (ref 6–23)
CO2: 26 meq/L (ref 19–32)
Calcium: 9.1 mg/dL (ref 8.4–10.5)
Chloride: 104 meq/L (ref 96–112)
Creatinine, Ser: 0.74 mg/dL (ref 0.40–1.20)
GFR: 83.21 mL/min (ref >60.00)
Glucose, Bld: 112 mg/dL — ABNORMAL HIGH (ref 70–99)
Potassium: 4.3 meq/L (ref 3.5–5.1)
Sodium: 139 meq/L (ref 135–145)

## 2024-02-06 LAB — MAGNESIUM: Magnesium: 1.7 mg/dL (ref 1.5–2.5)

## 2024-02-06 MED ORDER — METFORMIN HCL ER 500 MG PO TB24: 1000.0000 mg | ORAL_TABLET | Freq: Two times a day (BID) | ORAL | 1 refills | Status: DC

## 2024-02-06 NOTE — Progress Notes (Signed)
 Subjective:    [name], female    DOB: 24-Oct-1955, 68 y.o.   MRN: [id number]  Patient here for  Chief Complaint  Patient presents with   Hospitalization Follow-up    HFU-Colecystectomy    HPI Here for hospital follow up. Hospitalized 01/16/24 - 01/24/24 - after presenting with epigastric pain, chest pain and dizziness. CT - gallstone. Started on zosyn. S/p cholecystectomy 10/15. Post op - afib with RVR. Started on diltiazem. Did not convert. Changed to amiodarone and heparin. Cardiology consulted. Started eliquis  01/21/24. Metoprolol  50mg  bid. Digoxin stopped. Plan f/u with EP and cardiology outpatient. Reports she is doing better. Still with some nausea and decreased appetite. She is eating. Drinking protein shakes. Some loose stool after eating. Discussed with surgery. Following. No chest pain. Did notice some heart racing when got up to go to [place] one night, but overall stable. Breathing stable.    Past Medical History:  Diagnosis Date   Achilles rupture, right    Contact dermatitis and eczema due to plant    Diabetes mellitus without complication (HCC)    Environmental allergies    Glaucoma    History of chicken pox    Hyperlipidemia    Hypertension    Osteopenia    Paroxysmal A-fib (HCC)    Past Surgical History:  Procedure Laterality Date   ACHILLES TENDON REPAIR Right    BREAST BIOPSY Right 1999   neg   CHOLECYSTECTOMY     COLONOSCOPY WITH PROPOFOL  N/A 12/12/2017   Procedure: COLONOSCOPY WITH PROPOFOL ;  Surgeon: Viktoria Lamar DASEN, MD;  Location: [facility] ENDOSCOPY;  Service: Endoscopy;  Laterality: N/A;   COLONOSCOPY WITH PROPOFOL  N/A 06/24/2023   Procedure: COLONOSCOPY WITH PROPOFOL ;  Surgeon: Onita Elspeth Sharper, DO;  Location: [facility] ENDOSCOPY;  Service: Gastroenterology;  Laterality: N/A;  DM, 1ST CASE, PLEASE   TUBAL LIGATION     Family History  Problem Relation Age of Onset   Colon polyps Mother    Alzheimer's disease Mother    Heart  attack Father 55   Hypertension Father    Diabetes Father    Hypertension Sister    Diabetes Sister        x2   COPD Sister    Diabetes Sister    Hypertension Sister    Glaucoma Sister    Arrhythmia Son        Faster heart beat than normal   Breast cancer Neg Hx    Colon cancer Neg Hx    Social History   Socioeconomic History   Marital status: Married    Spouse name: Not on file   Number of children: 3   Years of education: Not on file   Highest education level: Some college, no degree  Occupational History    Employer: [employer]  Tobacco Use   Smoking status: Never    Passive exposure: Never   Smokeless tobacco: Never  Vaping Use   Vaping status: Never Used  Substance and Sexual Activity   Alcohol use: No    Alcohol/week: 0.0 standard drinks of alcohol   Drug use: No   Sexual activity: Not on file  Other Topics Concern   Not on file  Social History Narrative   Married   Social Drivers of Health   Financial Resource Strain: Low Risk  (11/21/2023)   Overall Financial Resource Strain (CARDIA)    Difficulty of Paying Living Expenses: Not hard at all  Food Insecurity: No Food Insecurity (01/25/2024)   Hunger Vital  Sign    Worried About Programme Researcher, Broadcasting/film/video in the Last Year: Never true    Ran Out of Food in the Last Year: Never true  Transportation Needs: No Transportation Needs (01/25/2024)   PRAPARE - Administrator, Civil Service (Medical): No    Lack of Transportation (Non-Medical): No  Physical Activity: Insufficiently Active (04/11/2023)   Exercise Vital Sign    Days of Exercise per Week: 4 days    Minutes of Exercise per Session: 30 min  Stress: No Stress Concern Present (11/21/2023)   [employer] of Occupational Health - Occupational Stress Questionnaire    Feeling of Stress: Not at all  Social Connections: Socially Integrated (01/17/2024)   Social Connection and Isolation Panel    Frequency of Communication with Friends and Family: More than  three times a week    Frequency of Social Gatherings with Friends and Family: More than three times a week    Attends Religious Services: More than 4 times per year    Active Member of [employer] or Organizations: Yes    Attends Engineer, Structural: More than 4 times per year    Marital Status: Married     Review of Systems  Constitutional:  Negative for fever.       Decreased appetite. Decreased weight.   HENT:  Negative for congestion and sinus pressure.   Respiratory:  Negative for cough, chest tightness and shortness of breath.   Cardiovascular:  Negative for chest pain, palpitations and leg swelling.  Gastrointestinal:  Positive for nausea. Negative for vomiting.       Loose stool as outlined.   Genitourinary:  Negative for difficulty urinating and dysuria.  Musculoskeletal:  Negative for joint swelling and myalgias.  Skin:  Negative for color change and rash.  Neurological:  Negative for dizziness and headaches.  Psychiatric/Behavioral:  Negative for agitation and dysphoric mood.        Objective:     BP (!) 100/50   Pulse (!) 52   Temp 97.8 F (36.6 C) (Oral)   Ht 5' 2 (1.575 m)   Wt 155 lb 8 oz (70.5 kg)   LMP 02/26/2008   SpO2 98%   BMI 28.44 kg/m  Wt Readings from Last 3 Encounters:  02/06/24 155 lb 8 oz (70.5 kg)  02/02/24 161 lb (73 kg)  01/16/24 165 lb (74.8 kg)    Physical Exam Vitals reviewed.  Constitutional:      General: She is not in acute distress.    Appearance: Normal appearance.  HENT:     Head: Normocephalic and atraumatic.     Right Ear: External ear normal.     Left Ear: External ear normal.     Mouth/Throat:     Pharynx: No oropharyngeal exudate or posterior oropharyngeal erythema.  Eyes:     General: No scleral icterus.       Right eye: No discharge.        Left eye: No discharge.     Conjunctiva/sclera: Conjunctivae normal.  Neck:     Thyroid : No thyromegaly.  Cardiovascular:     Rate and Rhythm: Normal rate and regular  rhythm.  Pulmonary:     Effort: No respiratory distress.     Breath sounds: Normal breath sounds. No wheezing.  Abdominal:     General: Bowel sounds are normal.     Palpations: Abdomen is soft.     Comments: Lesions - healing. Minimal discomfort to palpation.   Musculoskeletal:  General: No swelling or tenderness.     Cervical back: Neck supple. No tenderness.  Lymphadenopathy:     Cervical: No cervical adenopathy.  Skin:    Findings: No erythema or rash.  Neurological:     Mental Status: She is alert.  Psychiatric:        Mood and Affect: Mood normal.        Behavior: Behavior normal.         Outpatient Encounter Medications as of 02/06/2024  Medication Sig   amiodarone (PACERONE) 200 MG tablet Take 2 tablets (400 mg total) by mouth 2 (two) times daily for 3 days, THEN 2 tablets (400 mg total) daily for 7 days, THEN 1 tablet (200 mg total) daily.   apixaban  (ELIQUIS ) 5 MG TABS tablet Take 1 tablet (5 mg total) by mouth 2 (two) times daily.   dorzolamide-timolol (COSOPT) 22.3-6.8 MG/ML ophthalmic solution Place 1 drop into both eyes 2 (two) times daily.   empagliflozin  (JARDIANCE ) 25 MG TABS tablet Take 1 tablet (25 mg total) by mouth daily.   finasteride  (PROSCAR ) 5 MG tablet Take 1 tablet (5 mg total) by mouth daily.   glipiZIDE  (GLUCOTROL  XL) 2.5 MG 24 hr tablet Take 1 tablet (2.5 mg total) by mouth daily with breakfast.   glucose blood (GE100 BLOOD GLUCOSE TEST) test strip TEST TWICE DAILY   latanoprost (XALATAN) 0.005 % ophthalmic solution Place 1 drop into both eyes at bedtime.   metoprolol  tartrate (LOPRESSOR ) 50 MG tablet Take 1 tablet (50 mg total) by mouth 2 (two) times daily.   Multiple Vitamin (MULTIVITAMIN) tablet Take 1 tablet by mouth daily.   rosuvastatin  (CRESTOR ) 10 MG tablet TAKE ONE TABLET EVERY DAY   vitamin E 200 UNIT capsule Take 200 Units by mouth daily.   [DISCONTINUED] metFORMIN  (GLUCOPHAGE -XR) 500 MG 24 hr tablet TAKE TWO TABLETS BY MOUTH EVERY  MORNING AND AT BEDTIME   metFORMIN  (GLUCOPHAGE -XR) 500 MG 24 hr tablet Take 2 tablets (1,000 mg total) by mouth 2 (two) times daily with a meal.   No facility-administered encounter medications on file as of 02/06/2024.     Lab Results  Component Value Date   WBC 8.6 02/06/2024   HGB 12.1 02/06/2024   HCT 37.9 02/06/2024   PLT 501.0 (H) 02/06/2024   GLUCOSE 112 (H) 02/06/2024   CHOL 101 11/18/2023   TRIG 185.0 (H) 11/18/2023   HDL 33.70 (L) 11/18/2023   LDLDIRECT 63.0 04/28/2022   LDLCALC 30 11/18/2023   ALT 46 (H) 02/06/2024   AST 35 02/06/2024   NA 139 02/06/2024   K 4.3 02/06/2024   CL 104 02/06/2024   CREATININE 0.74 02/06/2024   BUN 17 02/06/2024   CO2 26 02/06/2024   TSH 1.711 01/23/2024   INR 1.5 (H) 01/19/2024   HGBA1C 6.7 (H) 11/18/2023    CT Angio Chest/Abd/Pel for Dissection W and/or Wo Contrast Result Date: 01/17/2024 CLINICAL DATA:  Acute aortic syndrome (AAS) suspected.  Chest pain EXAM: CT ANGIOGRAPHY CHEST, ABDOMEN AND PELVIS TECHNIQUE: Non-contrast CT of the chest was initially obtained. Multidetector CT imaging through the chest, abdomen and pelvis was performed using the standard protocol during bolus administration of intravenous contrast. Multiplanar reconstructed images and MIPs were obtained and reviewed to evaluate the vascular anatomy. RADIATION DOSE REDUCTION: This exam was performed according to the departmental dose-optimization program which includes automated exposure control, adjustment of the mA and/or kV according to patient size and/or use of iterative reconstruction technique. CONTRAST:  OMNIPAQUE  IOHEXOL  350 MG/ML  SOLN COMPARISON:  05/28/2021 FINDINGS: CTA CHEST FINDINGS Cardiovascular: Heart is normal size. Aorta is normal caliber. No dissection. No filling defects in the pulmonary arteries to suggest pulmonary emboli. Mediastinum/Nodes: No mediastinal, hilar, or axillary adenopathy. Trachea and esophagus are unremarkable. Substernal left  thyroid  nodule measures up to 2.3 cm. Lungs/Pleura: Lungs are clear. No focal airspace opacities or suspicious nodules. No effusions. No pneumothorax. Musculoskeletal: Chest wall soft tissues are unremarkable. No acute bony abnormality. Review of the MIP images confirms the above findings. CTA ABDOMEN AND PELVIS FINDINGS VASCULAR Aorta: Normal caliber aorta without aneurysm, dissection, vasculitis or significant stenosis. Celiac: Patent without evidence of aneurysm, dissection, vasculitis or significant stenosis. SMA: Patent without evidence of aneurysm, dissection, vasculitis or significant stenosis. Renals: Both renal arteries are patent without evidence of aneurysm, dissection, vasculitis, fibromuscular dysplasia or significant stenosis. IMA: Patent without evidence of aneurysm, dissection, vasculitis or significant stenosis. Inflow: Patent without evidence of aneurysm, dissection, vasculitis or significant stenosis. Veins: No obvious venous abnormality within the limitations of this arterial phase study. Review of the MIP images confirms the above findings. NON-VASCULAR Hepatobiliary: 3 cm gallstone within the gallbladder. No CT evidence of acute cholecystitis. Mild low-density throughout the liver compatible with fatty infiltration. No focal hepatic abnormality. Pancreas: No focal abnormality or ductal dilatation. Spleen: No focal abnormality.  Normal size. Adrenals/Urinary Tract: No adrenal abnormality. No focal renal abnormality. No stones or hydronephrosis. Urinary bladder is unremarkable. Stomach/Bowel: Normal appendix. Scattered colonic diverticula. No active diverticulitis. Stomach and small bowel decompressed. Lymphatic: No adenopathy Reproductive: Uterus and adnexa unremarkable.  No mass. Other: No free fluid or free air. Musculoskeletal: No acute bony abnormality. Review of the MIP images confirms the above findings. IMPRESSION: No evidence of aortic aneurysm or dissection. No evidence of pulmonary  embolus. No acute findings in the chest, abdomen or pelvis. Cholelithiasis. Scattered colonic diverticulosis. Electronically Signed   By: Franky Crease M.D.   On: 01/17/2024 01:33   DG Chest 2 View Result Date: 01/16/2024 EXAM: 2 VIEW(S) XRAY OF THE CHEST 01/16/2024 10:14:30 PM COMPARISON: None available. CLINICAL HISTORY: cp. PER ER NOTE; Pt reports centralized chest pain that began tonight around 2000, pt reports hx afib. Pt is on blood thinner and metoprolol . Pt also reports some dizziness. FINDINGS: LUNGS AND PLEURA: No focal pulmonary opacity. No pulmonary edema. No pleural effusion. No pneumothorax. HEART AND MEDIASTINUM: No acute abnormality of the cardiac and mediastinal silhouettes. BONES AND SOFT TISSUES: Degenerative changes of the thoracic spine with mild dextrocurvature. IMPRESSION: 1. No acute cardiopulmonary process. Electronically signed by: Pinkie Pebbles MD 01/16/2024 10:21 PM EDT RP Workstation: [id number]       Assessment & Plan:  Leukocytosis, unspecified type Assessment & Plan: Recheck cbc. Noted elevated wbc count during hospitalization.   Orders: -     CBC with Differential/Platelet  Abnormal liver function test Assessment & Plan: Noticed during hospitalization. Check liver panel today.   Orders: -     Hepatic function panel  Primary hypertension Assessment & Plan: Currently on lopressor . Follow pressures.  Check metabolic panel.   Orders: -     Basic metabolic panel with GFR  Loose stools Assessment & Plan: Noticed after surgery. Discussed with surgery. Following.    Atrial fibrillation, unspecified type [facility]) Assessment & Plan: Post op - afib with RVR. Started on diltiazem. Did not convert. Changed to amiodarone and heparin. Cardiology consulted. Started eliquis  01/21/24. Metoprolol  50mg  bid. Digoxin stopped. Plan f/u with EP and cardiology outpatient.   Orders: -  Magnesium  Type 2 diabetes mellitus with hyperglycemia, without long-term current  use of insulin (HCC) Assessment & Plan: Low carb diet and exercise. Follow met b and A1c. Continue jardiance , metformin  and glipizide . No changes in medication today. Stay hydrated.    Hyperlipidemia associated with type 2 diabetes mellitus (HCC) Assessment & Plan: On crestor .  Low cholesterol diet and exercise.  Follow lipid panel.  Lab Results  Component Value Date   CHOL 101 11/18/2023   HDL 33.70 (L) 11/18/2023   LDLCALC 30 11/18/2023   LDLDIRECT 63.0 04/28/2022   TRIG 185.0 (H) 11/18/2023   CHOLHDL 3 11/18/2023     Stress Assessment & Plan: Overall appears to be handling stress well.  Follow.    Other orders -     metFORMIN  HCl ER; Take 2 tablets (1,000 mg total) by mouth 2 (two) times daily with a meal.  Dispense: 360 tablet; Refill: 1     Allena Hamilton, MD

## 2024-02-07 ENCOUNTER — Ambulatory Visit: Payer: Self-pay | Admitting: Internal Medicine

## 2024-02-08 NOTE — Telephone Encounter (Signed)
 Patient called, left VM to return the call to the office regarding lab results.     Copied from CRM [id number]. Topic: Clinical - Lab/Test Results >> Feb 08, 2024  2:41 PM April Solis PARAS wrote: Reason for CRM: Pt is calling to obtain test results. Missed CMA call.

## 2024-02-09 ENCOUNTER — Other Ambulatory Visit: Payer: Self-pay

## 2024-02-09 DIAGNOSIS — R7989 Other specified abnormal findings of blood chemistry: Secondary | ICD-10-CM

## 2024-02-09 DIAGNOSIS — D72829 Elevated white blood cell count, unspecified: Secondary | ICD-10-CM

## 2024-02-12 ENCOUNTER — Encounter: Payer: Self-pay | Admitting: Internal Medicine

## 2024-02-12 NOTE — Assessment & Plan Note (Signed)
 Low carb diet and exercise. Follow met b and A1c. Continue jardiance , metformin  and glipizide . No changes in medication today. Stay hydrated.

## 2024-02-12 NOTE — Assessment & Plan Note (Signed)
 Noticed after surgery. Discussed with surgery. Following.

## 2024-02-12 NOTE — Assessment & Plan Note (Signed)
 Noticed during hospitalization. Check liver panel today.

## 2024-02-12 NOTE — Assessment & Plan Note (Signed)
Overall appears to be handling stress well.  Follow.  

## 2024-02-12 NOTE — Assessment & Plan Note (Signed)
 Currently on lopressor . Follow pressures.  Check metabolic panel.

## 2024-02-12 NOTE — Assessment & Plan Note (Signed)
 On crestor .  Low cholesterol diet and exercise.  Follow lipid panel.  Lab Results  Component Value Date   CHOL 101 11/18/2023   HDL 33.70 (L) 11/18/2023   LDLCALC 30 11/18/2023   LDLDIRECT 63.0 04/28/2022   TRIG 185.0 (H) 11/18/2023   CHOLHDL 3 11/18/2023

## 2024-02-12 NOTE — Assessment & Plan Note (Signed)
 Recheck cbc. Noted elevated wbc count during hospitalization.

## 2024-02-12 NOTE — Assessment & Plan Note (Signed)
 Post op - afib with RVR. Started on diltiazem. Did not convert. Changed to amiodarone and heparin. Cardiology consulted. Started eliquis  01/21/24. Metoprolol  50mg  bid. Digoxin stopped. Plan f/u with EP and cardiology outpatient.

## 2024-02-13 NOTE — Progress Notes (Unsigned)
 Electrophysiology Office Follow up Visit Note:    Date:  02/15/2024   ID:  April Solis, April Solis March 21, 1956, MRN [id number]  PCP:  Glendia Shad, MD  [facility] Cardiologist:  Lonni Hanson, MD  [facility] Electrophysiologist:  None    Interval History:     April Solis is a 68 y.o. female who presents for a follow up visit.   I saw the patient when she was hospitalized on January 23, 2024 with atrial fibrillation in the setting of cholecystitis.  She was started on amiodarone and Eliquis  that hospitalization.  She presents for follow-up.  She is with her son today in clinic.  She tells me her abdomen is healed.  She does tell me that she experiences intermittent lightheadedness and dizziness.  She tells me that the room spins every time she experiences lightheadedness.  She has not lost consciousness.  Symptoms are worse when she first gets out of bed in the morning.  Her watch has alerted for atrial fibrillation as recently as 1 week ago but episodes have decreased since leaving the hospital.       Past medical, surgical, social and family history were reviewed.  ROS:   Please see the history of present illness.    All other systems reviewed and are negative.  EKGs/Labs/Other Studies Reviewed:    The following studies were reviewed today:     EKG Interpretation Date/Time:  Wednesday February 15 2024 14:06:47 EST Ventricular Rate:  56 PR Interval:  170 QRS Duration:  88 QT Interval:  452 QTC Calculation: 436 R Axis:   18  Text Interpretation: Sinus bradycardia Confirmed by Cindie Smalls [phone]) on 02/15/2024 2:12:02 PM    Physical Exam:    VS:  BP (!) 100/50 (BP Location: Left Arm, Patient Position: Sitting, Cuff Size: Normal)   Pulse (!) 56 Comment: 55 oximeter  Ht 5' 1 (1.549 m)   Wt 160 lb 3.2 oz (72.7 kg)   LMP 02/26/2008   SpO2 98%   BMI 30.27 kg/m     Wt Readings from Last 3 Encounters:  02/15/24 160 lb 3.2 oz (72.7  kg)  02/06/24 155 lb 8 oz (70.5 kg)  02/02/24 161 lb (73 kg)     GEN: no distress CARD: RRR, No MRG RESP: No IWOB. CTAB.      ASSESSMENT:    1. Persistent atrial fibrillation (HCC)   2. Encounter for long-term (current) use of high-risk medication    PLAN:    In order of problems listed above:  #Persistent atrial fibrillation #High risk med monitoring-amiodarone Recurrence recently during hospitalization for cholecystitis.  Today, she is back in sinus rhythm but she did have recurrence about 1 week ago that lasted for several minutes. Recommend continuing amiodarone for 3 months following hospitalization and then can discontinue and monitor for recurrence.  I will reduce her dose to 100 mg by mouth once daily to see if this helps reduce lightheadedness episodes.  I anticipate this medication stopping at the 45-month follow-up. I would also reduce her metoprolol  to see if this helps.  She will stop taking metoprolol  tartrate and start metoprolol  succinate 25 mg by mouth once daily. Continue Eliquis   #Vertigo/dizziness See above for medication changes to see if this helps.  If she continues to have vertiginous symptoms, recommend primary care follow-up for possible Epley maneuver instruction.  I briefly discussed this with the patient today.  I discussed my upcoming departure from [facility] during today's clinic appointment.  She  will transition her care to [facility] moving forward.  Recommend follow-up with EP APP in 3 months.   Signed, Ole Holts, MD, [facility], [facility] 02/15/2024 2:12 PM    Electrophysiology [facility]

## 2024-02-15 ENCOUNTER — Encounter: Payer: Self-pay | Admitting: Cardiology

## 2024-02-15 ENCOUNTER — Ambulatory Visit: Attending: Cardiology | Admitting: Cardiology

## 2024-02-15 ENCOUNTER — Telehealth: Payer: Self-pay | Admitting: Internal Medicine

## 2024-02-15 VITALS — BP 100/50 | HR 56 | Ht 61.0 in | Wt 160.2 lb

## 2024-02-15 DIAGNOSIS — Z79899 Other long term (current) drug therapy: Secondary | ICD-10-CM | POA: Diagnosis not present

## 2024-02-15 DIAGNOSIS — I4819 Other persistent atrial fibrillation: Secondary | ICD-10-CM | POA: Diagnosis not present

## 2024-02-15 MED ORDER — METOPROLOL SUCCINATE ER 25 MG PO TB24: 25.0000 mg | ORAL_TABLET | Freq: Every day | ORAL | 3 refills | Status: AC

## 2024-02-15 MED ORDER — AMIODARONE HCL 100 MG PO TABS: 100.0000 mg | ORAL_TABLET | Freq: Every day | ORAL | 3 refills | Status: DC

## 2024-02-15 NOTE — Telephone Encounter (Signed)
 Talked with pharmacist in regards to medication conjunction. Pharmacist states there is a side effect of amiodarone that increases the plasma concentration of eliquis  and wants to know if okay for patient to stay on this conjunction. Was told to pharmacy that based on recent notes, amiodarone is only anticipated to continue for 3 months amid normal rhythm. Pharmacy verbalized understanding. Will still send to Dr Cindie and nurse for further insight.

## 2024-02-15 NOTE — Patient Instructions (Signed)
 Medication Instructions:  Your physician has recommended you make the following change in your medication:  1) DECREASE amiodarone 100 mg once daily  2) STOP taking metoprolol  tartrate 3) START taking metoprolol  succinate 25 mg once daily  Follow-Up: At [facility], you and your health needs are our priority.  As part of our continuing mission to provide you with exceptional heart care, our providers are all part of one team.  This team includes your primary Cardiologist (physician) and Advanced Practice Providers or APPs (Physician Assistants and Nurse Practitioners) who all work together to provide you with the care you need, when you need it.  Your next appointment:   3 months  Provider:   Suzann Riddle, NP

## 2024-02-15 NOTE — Telephone Encounter (Signed)
 Pt c/o medication issue:  1. Name of Medication: amiodarone (PACERONE) 100 MG tablet  metoprolol  succinate (TOPROL  XL) 25 MG 24 hr tablet  apixaban  (ELIQUIS ) 5 MG TABS tablet   2. How are you currently taking this medication (dosage and times per day)? N/A  3. Are you having a reaction (difficulty breathing--STAT)? N/A  4. What is your medication issue? Pharmacy would like to know if its okay for the pt to take all three medications.

## 2024-02-16 ENCOUNTER — Ambulatory Visit: Admitting: Cardiology

## 2024-02-27 ENCOUNTER — Ambulatory Visit: Admitting: Internal Medicine

## 2024-02-27 VITALS — BP 110/56 | HR 60 | Temp 97.6°F | Resp 18 | Ht 61.0 in | Wt 150.4 lb

## 2024-02-27 DIAGNOSIS — I480 Paroxysmal atrial fibrillation: Secondary | ICD-10-CM | POA: Diagnosis not present

## 2024-02-27 DIAGNOSIS — E785 Hyperlipidemia, unspecified: Secondary | ICD-10-CM

## 2024-02-27 DIAGNOSIS — I10 Essential (primary) hypertension: Secondary | ICD-10-CM

## 2024-02-27 DIAGNOSIS — F439 Reaction to severe stress, unspecified: Secondary | ICD-10-CM

## 2024-02-27 DIAGNOSIS — Z7984 Long term (current) use of oral hypoglycemic drugs: Secondary | ICD-10-CM

## 2024-02-27 DIAGNOSIS — E1169 Type 2 diabetes mellitus with other specified complication: Secondary | ICD-10-CM

## 2024-02-27 DIAGNOSIS — R7989 Other specified abnormal findings of blood chemistry: Secondary | ICD-10-CM

## 2024-02-27 DIAGNOSIS — R42 Dizziness and giddiness: Secondary | ICD-10-CM

## 2024-02-27 DIAGNOSIS — E1165 Type 2 diabetes mellitus with hyperglycemia: Secondary | ICD-10-CM

## 2024-02-27 DIAGNOSIS — I4891 Unspecified atrial fibrillation: Secondary | ICD-10-CM

## 2024-02-27 LAB — HEPATIC FUNCTION PANEL
ALT: 28 U/L (ref 0–35)
AST: 27 U/L (ref 0–37)
Albumin: 4.5 g/dL (ref 3.5–5.2)
Alkaline Phosphatase: 66 U/L (ref 39–117)
Bilirubin, Direct: 0.1 mg/dL (ref 0.0–0.3)
Total Bilirubin: 0.5 mg/dL (ref 0.2–1.2)
Total Protein: 7.4 g/dL (ref 6.0–8.3)

## 2024-02-27 LAB — CBC WITH DIFFERENTIAL/PLATELET
Basophils Absolute: 0 K/uL (ref 0.0–0.1)
Basophils Relative: 0.7 % (ref 0.0–3.0)
Eosinophils Absolute: 0.1 K/uL (ref 0.0–0.7)
Eosinophils Relative: 2.3 % (ref 0.0–5.0)
HCT: 41.6 % (ref 36.0–46.0)
Hemoglobin: 13.6 g/dL (ref 12.0–15.0)
Lymphocytes Relative: 20.4 % (ref 12.0–46.0)
Lymphs Abs: 1.2 K/uL (ref 0.7–4.0)
MCHC: 32.7 g/dL (ref 30.0–36.0)
MCV: 89.8 fl (ref 78.0–100.0)
Monocytes Absolute: 0.8 K/uL (ref 0.1–1.0)
Monocytes Relative: 12.9 % — ABNORMAL HIGH (ref 3.0–12.0)
Neutro Abs: 3.7 K/uL (ref 1.4–7.7)
Neutrophils Relative %: 63.7 % (ref 43.0–77.0)
Platelets: 264 K/uL (ref 150.0–400.0)
RBC: 4.63 Mil/uL (ref 3.87–5.11)
RDW: 15.4 % (ref 11.5–15.5)
WBC: 5.9 K/uL (ref 4.0–10.5)

## 2024-02-27 LAB — BASIC METABOLIC PANEL WITH GFR
BUN: 22 mg/dL (ref 6–23)
CO2: 28 meq/L (ref 19–32)
Calcium: 9.8 mg/dL (ref 8.4–10.5)
Chloride: 104 meq/L (ref 96–112)
Creatinine, Ser: 0.78 mg/dL (ref 0.40–1.20)
GFR: 78.09 mL/min (ref >60.00)
Glucose, Bld: 124 mg/dL — ABNORMAL HIGH (ref 70–99)
Potassium: 4.7 meq/L (ref 3.5–5.1)
Sodium: 140 meq/L (ref 135–145)

## 2024-02-27 LAB — MICROALBUMIN / CREATININE URINE RATIO
Creatinine,U: 50.8 mg/dL
Microalb Creat Ratio: UNDETERMINED mg/g (ref 0.0–30.0)
Microalb, Ur: <0.7 mg/dL

## 2024-02-27 LAB — LIPID PANEL
Cholesterol: 91 mg/dL (ref 0–200)
HDL: 38.4 mg/dL — ABNORMAL LOW (ref >39.00)
LDL Cholesterol: 25 mg/dL (ref 0–99)
NonHDL: 52.74
Total CHOL/HDL Ratio: 2
Triglycerides: 141 mg/dL (ref 0.0–149.0)
VLDL: 28.2 mg/dL (ref 0.0–40.0)

## 2024-02-27 LAB — HEMOGLOBIN A1C: Hgb A1c MFr Bld: 5.9 % (ref 4.6–6.5)

## 2024-02-27 LAB — MAGNESIUM: Magnesium: 1.9 mg/dL (ref 1.5–2.5)

## 2024-02-27 MED ORDER — METFORMIN HCL ER 500 MG PO TB24
1000.0000 mg | ORAL_TABLET | Freq: Two times a day (BID) | ORAL | 1 refills | Status: AC
Start: 2024-02-27 — End: ?

## 2024-02-27 NOTE — Progress Notes (Signed)
 Subjective:    [name], female    DOB: 09-Dec-1955, 68 y.o.   MRN: [id number]  Patient here for scheduled follow up.   HPI Here for a scheduled follow up. Hospitalized 01/16/24 - 01/24/24 - after presenting with epigastric pain, chest pain and dizziness. CT - gallstone. Started on zosyn . S/p cholecystectomy 10/15. Post op - afib with RVR. Started on diltiazem . Did not convert. Changed to amiodarone  and heparin . Cardiology consulted. Started eliquis  01/21/24. Metoprolol  50mg  bid. Digoxin  stopped. I saw her 02/06/24 - hospital follow up. Was feeling better. Started on magnesium - low mag noted on lab results. Had f/u with Dr Cindie 02/15/24 - persistent intermittent afib. Recommended to continue amiodarone , but decreased dose to 100mg  q day - to see if helped with light headedness. Also changed metoprolol  tartrate to metoprolol  succinate 25mg  dialy. Recommended to continue eliquis . On questioning her, she reports overall she is feeling better. Eating better. No nausea or vomiting. No abdominal pain. She is still having issues with dizziness. Reports dizziness occurs when turn over in bed (quickly). Room spinning. Appears to be c/w vertigo. Started while in hospital.    Past Medical History:  Diagnosis Date   Achilles rupture, right    Contact dermatitis and eczema due to plant    Diabetes mellitus without complication (HCC)    Environmental allergies    Glaucoma    History of chicken pox    Hyperlipidemia    Hypertension    Osteopenia    Paroxysmal A-fib (HCC)    Past Surgical History:  Procedure Laterality Date   ACHILLES TENDON REPAIR Right    BREAST BIOPSY Right 1999   neg   CHOLECYSTECTOMY     CHOLECYSTECTOMY  01/18/2024   COLONOSCOPY WITH PROPOFOL  N/A 12/12/2017   Procedure: COLONOSCOPY WITH PROPOFOL ;  Surgeon: Viktoria Lamar DASEN, MD;  Location: [facility] ENDOSCOPY;  Service: Endoscopy;  Laterality: N/A;   COLONOSCOPY WITH PROPOFOL  N/A 06/24/2023    Procedure: COLONOSCOPY WITH PROPOFOL ;  Surgeon: Onita Elspeth Sharper, DO;  Location: [facility] ENDOSCOPY;  Service: Gastroenterology;  Laterality: N/A;  DM, 1ST CASE, PLEASE   TUBAL LIGATION     Family History  Problem Relation Age of Onset   Colon polyps Mother    Alzheimer's disease Mother    Heart attack Father 37   Hypertension Father    Diabetes Father    Hypertension Sister    Diabetes Sister        x2   COPD Sister    Diabetes Sister    Hypertension Sister    Glaucoma Sister    Arrhythmia Son        Faster heart beat than normal   Breast cancer Neg Hx    Colon cancer Neg Hx    Social History   Socioeconomic History   Marital status: Married    Spouse name: Not on file   Number of children: 3   Years of education: Not on file   Highest education level: Some college, no degree  Occupational History    Employer: [employer]  Tobacco Use   Smoking status: Never    Passive exposure: Never   Smokeless tobacco: Never  Vaping Use   Vaping status: Never Used  Substance and Sexual Activity   Alcohol use: No    Alcohol/week: 0.0 standard drinks of alcohol   Drug use: No   Sexual activity: Not on file  Other Topics Concern   Not on file  Social History Narrative  Married   Social Drivers of Corporate Investment Banker Strain: Low Risk  (02/27/2024)   Overall Financial Resource Strain (CARDIA)    Difficulty of Paying Living Expenses: Not hard at all  Food Insecurity: No Food Insecurity (02/27/2024)   Hunger Vital Sign    Worried About Running Out of Food in the Last Year: Never true    Ran Out of Food in the Last Year: Never true  Transportation Needs: No Transportation Needs (02/27/2024)   PRAPARE - Administrator, Civil Service (Medical): No    Lack of Transportation (Non-Medical): No  Physical Activity: Inactive (02/27/2024)   Exercise Vital Sign    Days of Exercise per Week: 0 days    Minutes of Exercise per Session: Not on file  Stress: No Stress  Concern Present (02/27/2024)   [employer] of Occupational Health - Occupational Stress Questionnaire    Feeling of Stress: Only a little  Social Connections: Socially Integrated (02/27/2024)   Social Connection and Isolation Panel    Frequency of Communication with Friends and Family: More than three times a week    Frequency of Social Gatherings with Friends and Family: More than three times a week    Attends Religious Services: More than 4 times per year    Active Member of [employer] or Organizations: Yes    Attends Engineer, Structural: More than 4 times per year    Marital Status: Married     Review of Systems  Constitutional:  Negative for appetite change, fever and unexpected weight change.  HENT:  Negative for congestion and sinus pressure.   Respiratory:  Negative for cough, chest tightness and shortness of breath.   Cardiovascular:  Negative for chest pain, palpitations and leg swelling.  Gastrointestinal:  Negative for abdominal pain, diarrhea, nausea and vomiting.  Genitourinary:  Negative for difficulty urinating and dysuria.  Musculoskeletal:  Negative for joint swelling and myalgias.  Skin:  Negative for color change and rash.  Neurological:  Positive for dizziness. Negative for headaches.  Psychiatric/Behavioral:  Negative for agitation and dysphoric mood.        Objective:     BP (!) 110/56   Pulse 60   Temp 97.6 F (36.4 C)   Resp 18   Ht 5' 1 (1.549 m)   Wt 150 lb 6.4 oz (68.2 kg)   LMP 02/26/2008   SpO2 98%   BMI 28.42 kg/m  Wt Readings from Last 3 Encounters:  02/28/24 150 lb (68 kg)  02/27/24 150 lb 6.4 oz (68.2 kg)  02/15/24 160 lb 3.2 oz (72.7 kg)    Physical Exam Vitals reviewed.  Constitutional:      General: She is not in acute distress.    Appearance: Normal appearance.  HENT:     Head: Normocephalic and atraumatic.     Right Ear: External ear normal.     Left Ear: External ear normal.     Mouth/Throat:     Pharynx: No  oropharyngeal exudate or posterior oropharyngeal erythema.  Eyes:     General: No scleral icterus.       Right eye: No discharge.        Left eye: No discharge.     Conjunctiva/sclera: Conjunctivae normal.  Neck:     Thyroid : No thyromegaly.  Cardiovascular:     Rate and Rhythm: Normal rate and regular rhythm.  Pulmonary:     Effort: No respiratory distress.     Breath sounds: Normal breath sounds.  No wheezing.  Abdominal:     General: Bowel sounds are normal.     Palpations: Abdomen is soft.     Tenderness: There is no abdominal tenderness.  Musculoskeletal:        General: No swelling or tenderness.     Cervical back: Neck supple. No tenderness.  Lymphadenopathy:     Cervical: No cervical adenopathy.  Skin:    Findings: No erythema or rash.  Neurological:     Mental Status: She is alert.     Comments: Reproducible dizziness - on exam - when lying down/looking to the right.   Psychiatric:        Mood and Affect: Mood normal.        Behavior: Behavior normal.         Outpatient Encounter Medications as of 02/27/2024  Medication Sig   amiodarone  (PACERONE ) 100 MG tablet Take 1 tablet (100 mg total) by mouth daily.   apixaban  (ELIQUIS ) 5 MG TABS tablet Take 1 tablet (5 mg total) by mouth 2 (two) times daily.   dorzolamide-timolol (COSOPT) 22.3-6.8 MG/ML ophthalmic solution Place 1 drop into both eyes 2 (two) times daily.   empagliflozin  (JARDIANCE ) 25 MG TABS tablet Take 1 tablet (25 mg total) by mouth daily.   finasteride  (PROSCAR ) 5 MG tablet Take 1 tablet (5 mg total) by mouth daily.   glipiZIDE  (GLUCOTROL  XL) 2.5 MG 24 hr tablet Take 1 tablet (2.5 mg total) by mouth daily with breakfast.   glucose blood (GE100 BLOOD GLUCOSE TEST) test strip TEST TWICE DAILY   latanoprost (XALATAN) 0.005 % ophthalmic solution Place 1 drop into both eyes at bedtime.   metFORMIN  (GLUCOPHAGE -XR) 500 MG 24 hr tablet Take 2 tablets (1,000 mg total) by mouth 2 (two) times daily with a meal.    metoprolol  succinate (TOPROL  XL) 25 MG 24 hr tablet Take 1 tablet (25 mg total) by mouth daily.   Multiple Vitamin (MULTIVITAMIN) tablet Take 1 tablet by mouth daily.   rosuvastatin  (CRESTOR ) 10 MG tablet TAKE ONE TABLET EVERY DAY   vitamin E 200 UNIT capsule Take 200 Units by mouth daily.   [DISCONTINUED] metFORMIN  (GLUCOPHAGE -XR) 500 MG 24 hr tablet Take 2 tablets (1,000 mg total) by mouth 2 (two) times daily with a meal.   No facility-administered encounter medications on file as of 02/27/2024.     Lab Results  Component Value Date   WBC 5.9 02/27/2024   HGB 13.6 02/27/2024   HCT 41.6 02/27/2024   PLT 264.0 02/27/2024   GLUCOSE 124 (H) 02/27/2024   CHOL 91 02/27/2024   TRIG 141.0 02/27/2024   HDL 38.40 (L) 02/27/2024   LDLDIRECT 63.0 04/28/2022   LDLCALC 25 02/27/2024   ALT 28 02/27/2024   AST 27 02/27/2024   NA 140 02/27/2024   K 4.7 02/27/2024   CL 104 02/27/2024   CREATININE 0.78 02/27/2024   BUN 22 02/27/2024   CO2 28 02/27/2024   TSH 1.711 01/23/2024   INR 1.5 (H) 01/19/2024   HGBA1C 5.9 02/27/2024   MICROALBUR <0.7 02/27/2024    CT Angio Chest/Abd/Pel for Dissection W and/or Wo Contrast Result Date: 01/17/2024 CLINICAL DATA:  Acute aortic syndrome (AAS) suspected.  Chest pain EXAM: CT ANGIOGRAPHY CHEST, ABDOMEN AND PELVIS TECHNIQUE: Non-contrast CT of the chest was initially obtained. Multidetector CT imaging through the chest, abdomen and pelvis was performed using the standard protocol during bolus administration of intravenous contrast. Multiplanar reconstructed images and MIPs were obtained and reviewed to evaluate the vascular anatomy.  RADIATION DOSE REDUCTION: This exam was performed according to the departmental dose-optimization program which includes automated exposure control, adjustment of the mA and/or kV according to patient size and/or use of iterative reconstruction technique. CONTRAST:  OMNIPAQUE  IOHEXOL  350 MG/ML SOLN COMPARISON:  05/28/2021  FINDINGS: CTA CHEST FINDINGS Cardiovascular: Heart is normal size. Aorta is normal caliber. No dissection. No filling defects in the pulmonary arteries to suggest pulmonary emboli. Mediastinum/Nodes: No mediastinal, hilar, or axillary adenopathy. Trachea and esophagus are unremarkable. Substernal left thyroid  nodule measures up to 2.3 cm. Lungs/Pleura: Lungs are clear. No focal airspace opacities or suspicious nodules. No effusions. No pneumothorax. Musculoskeletal: Chest wall soft tissues are unremarkable. No acute bony abnormality. Review of the MIP images confirms the above findings. CTA ABDOMEN AND PELVIS FINDINGS VASCULAR Aorta: Normal caliber aorta without aneurysm, dissection, vasculitis or significant stenosis. Celiac: Patent without evidence of aneurysm, dissection, vasculitis or significant stenosis. SMA: Patent without evidence of aneurysm, dissection, vasculitis or significant stenosis. Renals: Both renal arteries are patent without evidence of aneurysm, dissection, vasculitis, fibromuscular dysplasia or significant stenosis. IMA: Patent without evidence of aneurysm, dissection, vasculitis or significant stenosis. Inflow: Patent without evidence of aneurysm, dissection, vasculitis or significant stenosis. Veins: No obvious venous abnormality within the limitations of this arterial phase study. Review of the MIP images confirms the above findings. NON-VASCULAR Hepatobiliary: 3 cm gallstone within the gallbladder. No CT evidence of acute cholecystitis. Mild low-density throughout the liver compatible with fatty infiltration. No focal hepatic abnormality. Pancreas: No focal abnormality or ductal dilatation. Spleen: No focal abnormality.  Normal size. Adrenals/Urinary Tract: No adrenal abnormality. No focal renal abnormality. No stones or hydronephrosis. Urinary bladder is unremarkable. Stomach/Bowel: Normal appendix. Scattered colonic diverticula. No active diverticulitis. Stomach and small bowel  decompressed. Lymphatic: No adenopathy Reproductive: Uterus and adnexa unremarkable.  No mass. Other: No free fluid or free air. Musculoskeletal: No acute bony abnormality. Review of the MIP images confirms the above findings. IMPRESSION: No evidence of aortic aneurysm or dissection. No evidence of pulmonary embolus. No acute findings in the chest, abdomen or pelvis. Cholelithiasis. Scattered colonic diverticulosis. Electronically Signed   By: Franky Crease M.D.   On: 01/17/2024 01:33   DG Chest 2 View Result Date: 01/16/2024 EXAM: 2 VIEW(S) XRAY OF THE CHEST 01/16/2024 10:14:30 PM COMPARISON: None available. CLINICAL HISTORY: cp. PER ER NOTE; Pt reports centralized chest pain that began tonight around 2000, pt reports hx afib. Pt is on blood thinner and metoprolol . Pt also reports some dizziness. FINDINGS: LUNGS AND PLEURA: No focal pulmonary opacity. No pulmonary edema. No pleural effusion. No pneumothorax. HEART AND MEDIASTINUM: No acute abnormality of the cardiac and mediastinal silhouettes. BONES AND SOFT TISSUES: Degenerative changes of the thoracic spine with mild dextrocurvature. IMPRESSION: 1. No acute cardiopulmonary process. Electronically signed by: Pinkie Pebbles MD 01/16/2024 10:21 PM EDT RP Workstation: [id number]       Assessment & Plan:  Paroxysmal atrial fibrillation with rapid ventricular response (HCC)  Hyperlipidemia associated with type 2 diabetes mellitus (HCC) Assessment & Plan: On crestor .  Low cholesterol diet and exercise.  Follow lipid panel.  Lab Results  Component Value Date   CHOL 91 02/27/2024   HDL 38.40 (L) 02/27/2024   LDLCALC 25 02/27/2024   LDLDIRECT 63.0 04/28/2022   TRIG 141.0 02/27/2024   CHOLHDL 2 02/27/2024    Orders: -     Lipid panel -     Hepatic function panel -     CBC with Differential/Platelet  Type 2 diabetes mellitus with  hyperglycemia, without long-term current use of insulin  [facility]) Assessment & Plan: Low carb diet and exercise.  Continue jardiance , metformin  and glipizide . No change in medication today. Follow met b and A1c.   Orders: -     Microalbumin / creatinine urine ratio -     Hemoglobin A1c -     Basic metabolic panel with GFR  Abnormal liver function test  Hypomagnesemia Assessment & Plan: Recheck magnesium level today.   Orders: -     Magnesium  Dizziness Assessment & Plan: Persistent intermittent dizziness as outlined. Occurs when rolls over in bed. Reproducible on exam. Appears to be c/w vertigo. Given persistence, refer to ENT for evaluation. Discussed epley maneuvers.   Orders: -     Ambulatory referral to ENT  Stress Assessment & Plan: Overall appears to be handling stress well.  Follow.    Primary hypertension Assessment & Plan: Continue on toprol  as directed. Blood pressure as outlined. Follow pressures. Follow metabolic panel.    Atrial fibrillation, unspecified type [facility]) Assessment & Plan: Post op - afib with RVR. Started on diltiazem . Did not convert. Changed to amiodarone  and heparin . Cardiology consulted. Started eliquis  01/21/24. Metoprolol  50mg  bid. Digoxin  stopped. Had f/u with Dr Cindie 02/15/24 - persistent intermittent afib. Recommended to continue amiodarone , but decreased dose to 100mg  q day - to see if helped with light headedness. Also changed metoprolol  tartrate to metoprolol  succinate 25mg  dialy. Recommended to continue eliquis . Stable.    Other orders -     metFORMIN  HCl ER; Take 2 tablets (1,000 mg total) by mouth 2 (two) times daily with a meal.  Dispense: 360 tablet; Refill: 1     Allena Hamilton, MD

## 2024-02-28 ENCOUNTER — Ambulatory Visit: Payer: Medicare Other | Admitting: *Deleted

## 2024-02-28 ENCOUNTER — Ambulatory Visit: Payer: Self-pay | Admitting: Internal Medicine

## 2024-02-28 VITALS — Ht 61.0 in | Wt 150.0 lb

## 2024-02-28 DIAGNOSIS — Z0000 Encounter for general adult medical examination without abnormal findings: Secondary | ICD-10-CM | POA: Diagnosis not present

## 2024-02-28 NOTE — Patient Instructions (Signed)
 April Solis,  Thank you for taking the time for your Medicare Wellness Visit. I appreciate your continued commitment to your health goals. Please review the care plan we discussed, and feel free to reach out if I can assist you further.  Please note that Annual Wellness Visits do not include a physical exam. Some assessments may be limited, especially if the visit was conducted virtually. If needed, we may recommend an in-person follow-up with your provider.  Ongoing Care Seeing your primary care provider every 3 to 6 months helps us  monitor your health and provide consistent, personalized care.  Consider updating your vaccines.  Referrals If a referral was made during today's visit and you haven't received any updates within two weeks, please contact the referred provider directly to check on the status.  Recommended Screenings:  Health Maintenance  Topic Date Due   Hepatitis C Screening  Never done   Zoster (Shingles) Vaccine (1 of 2) 10/25/1974   Pneumococcal Vaccine for age over 44 (1 of 2 - PCV) 07/18/2024*   Flu Shot  12/15/2024*   Eye exam for diabetics  06/27/2024   Hemoglobin A1C  08/26/2024   Complete foot exam   11/21/2024   Breast Cancer Screening  01/09/2025   Yearly kidney function blood test for diabetes  02/26/2025   Yearly kidney health urinalysis for diabetes  02/26/2025   Medicare Annual Wellness Visit  02/27/2025   Colon Cancer Screening  06/23/2028   Osteoporosis screening with Bone Density Scan  Completed   Meningitis B Vaccine  Aged Out   DTaP/Tdap/Td vaccine  Discontinued   COVID-19 Vaccine  Discontinued  *Topic was postponed. The date shown is not the original due date.       02/28/2024    2:24 PM  Advanced Directives  Does Patient Have a Medical Advance Directive? Yes  Type of Estate Agent of [place];Living will  Does patient want to make changes to medical advance directive? No - Patient declined  Copy of Healthcare Power of  Attorney in Chart? No - copy requested    Vision: Annual vision screenings are recommended for early detection of glaucoma, cataracts, and diabetic retinopathy. These exams can also reveal signs of chronic conditions such as diabetes and high blood pressure.  Dental: Annual dental screenings help detect early signs of oral cancer, gum disease, and other conditions linked to overall health, including heart disease and diabetes.  Please see the attached documents for additional preventive care recommendations.

## 2024-02-28 NOTE — Progress Notes (Signed)
 Chief Complaint  Patient presents with   Medicare Wellness     Subjective:   April Solis is a 68 y.o. female who presents for a Medicare Annual Wellness Visit.  Allergies (verified) Influenza vaccines, Rubbing alcohol [alcohol], and Other   History: Past Medical History:  Diagnosis Date   Achilles rupture, right    Contact dermatitis and eczema due to plant    Diabetes mellitus without complication (HCC)    Environmental allergies    Glaucoma    History of chicken pox    Hyperlipidemia    Hypertension    Osteopenia    Paroxysmal A-fib (HCC)    Past Surgical History:  Procedure Laterality Date   ACHILLES TENDON REPAIR Right    BREAST BIOPSY Right 1999   neg   CHOLECYSTECTOMY     CHOLECYSTECTOMY  01/18/2024   COLONOSCOPY WITH PROPOFOL  N/A 12/12/2017   Procedure: COLONOSCOPY WITH PROPOFOL ;  Surgeon: Viktoria Lamar DASEN, MD;  Location: [facility] ENDOSCOPY;  Service: Endoscopy;  Laterality: N/A;   COLONOSCOPY WITH PROPOFOL  N/A 06/24/2023   Procedure: COLONOSCOPY WITH PROPOFOL ;  Surgeon: Onita Elspeth Sharper, DO;  Location: [facility] ENDOSCOPY;  Service: Gastroenterology;  Laterality: N/A;  DM, 1ST CASE, PLEASE   TUBAL LIGATION     Family History  Problem Relation Age of Onset   Colon polyps Mother    Alzheimer's disease Mother    Heart attack Father 30   Hypertension Father    Diabetes Father    Hypertension Sister    Diabetes Sister        x2   COPD Sister    Diabetes Sister    Hypertension Sister    Glaucoma Sister    Arrhythmia Son        Faster heart beat than normal   Breast cancer Neg Hx    Colon cancer Neg Hx    Social History   Occupational History    Employer: [employer]  Tobacco Use   Smoking status: Never    Passive exposure: Never   Smokeless tobacco: Never  Vaping Use   Vaping status: Never Used  Substance and Sexual Activity   Alcohol use: No    Alcohol/week: 0.0 standard drinks of alcohol   Drug use: No   Sexual activity: Not on file    Tobacco Counseling Counseling given: Not Answered  SDOH Screenings   Food Insecurity: No Food Insecurity (02/27/2024)  Housing: Low Risk  (02/27/2024)  Transportation Needs: No Transportation Needs (02/27/2024)  Utilities: Not At Risk (02/28/2024)  Alcohol Screen: Low Risk  (02/28/2024)  Depression (PHQ2-9): Low Risk  (02/28/2024)  Financial Resource Strain: Low Risk  (02/27/2024)  Physical Activity: Inactive (02/27/2024)  Social Connections: Socially Integrated (02/27/2024)  Stress: No Stress Concern Present (02/27/2024)  Tobacco Use: Low Risk  (02/28/2024)  Health Literacy: Adequate Health Literacy (02/28/2024)   See flowsheets for full screening details  Depression Screen PHQ 2 & 9 Depression Scale- Over the past 2 weeks, how often have you been bothered by any of the following problems? Little interest or pleasure in doing things: 0 Feeling down, depressed, or hopeless (PHQ Adolescent also includes...irritable): 0 PHQ-2 Total Score: 0 Trouble falling or staying asleep, or sleeping too much: 0 Feeling tired or having little energy: 1 Poor appetite or overeating (PHQ Adolescent also includes...weight loss): 0 Feeling bad about yourself - or that you are a failure or have let yourself or your family down: 0 Trouble concentrating on things, such as reading the newspaper or watching television (  PHQ Adolescent also includes...like school work): 0 Moving or speaking so slowly that other people could have noticed. Or the opposite - being so fidgety or restless that you have been moving around a lot more than usual: 0 Thoughts that you would be better off dead, or of hurting yourself in some way: 0 PHQ-9 Total Score: 1 If you checked off any problems, how difficult have these problems made it for you to do your work, take care of things at home, or get along with other people?: Not difficult at all     Goals Addressed             This Visit's Progress    Patient Stated        Wants to get strength back from her recent surgery       Visit info / Clinical Intake: Medicare Wellness Visit Type:: Subsequent Annual Wellness Visit Persons participating in visit:: patient Medicare Wellness Visit Mode:: Telephone If telephone:: video declined Because this visit was a virtual/telehealth visit:: pt reported vitals If Telephone or Video please confirm:: I connected with the patient using audio enabled telemedicine application and verified that I am speaking with the correct person using two identifiers; I discussed the limitations of evaluation and management by telemedicine; The patient expressed understanding and agreed to proceed Patient Location:: [place] Provider Location:: Office/Home Information given by:: patient Interpreter Needed?: No Pre-visit prep was completed: yes AWV questionnaire completed by patient prior to visit?: yes Date:: 02/27/24 Living arrangements:: lives with spouse/significant other Patient's Overall Health Status Rating: good Typical amount of pain: none Does pain affect daily life?: no Are you currently prescribed opioids?: no  Dietary Habits and Nutritional Risks How many meals a day?: 2 Eats fruit and vegetables daily?: yes Most meals are obtained by: preparing own meals In the last 2 weeks, have you had any of the following?: (!) nausea, vomiting, diarrhea (diarrhea from recent surgery, doctor is aware) Diabetic:: (!) yes Any non-healing wounds?: no How often do you check your BS?: 1 Would you like to be referred to a Nutritionist or for Diabetic Management? : no  Functional Status Activities of Daily Living (to include ambulation/medication): Independent Ambulation: Independent Medication Administration: Independent Home Management: Independent Manage your own finances?: yes Primary transportation is: family/friends Concerns about vision?: no *vision screening is required for WTM* Concerns about hearing?: no  Fall  Screening Falls in the past year?: 0 Number of falls in past year: 0 Was there an injury with Fall?: 0 Fall Risk Category Calculator: 0 Patient Fall Risk Level: Low Fall Risk  Fall Risk Patient at Risk for Falls Due to: No Fall Risks Fall risk Follow up: Falls evaluation completed; Falls prevention discussed  Home and Transportation Safety: All rugs have non-skid backing?: N/A, no rugs All stairs or steps have railings?: N/A, no stairs Grab bars in the bathtub or shower?: (!) no Have non-skid surface in bathtub or shower?: (!) no Good home lighting?: yes Regular seat belt use?: yes Hospital stays in the last year:: (!) yes How many hospital stays:: 1 Reason: gallbadder surgery  Cognitive Assessment Difficulty concentrating, remembering, or making decisions? : no Will 6CIT or Mini Cog be Completed: yes What year is it?: 0 points What month is it?: 0 points Give patient an address phrase to remember (5 components): [address] [place] what time is it?: 0 points Count backwards from 20 to 1: 0 points Say the months of the year in reverse: 0 points Repeat  the address phrase from earlier: 0 points 6 CIT Score: 0 points  Advance Directives (For Healthcare) Does Patient Have a Medical Advance Directive?: Yes Does patient want to make changes to medical advance directive?: No - Patient declined Type of Advance Directive: Healthcare Power of [place]; Living will Copy of Healthcare Power of Attorney in Chart?: No - copy requested Copy of Living Will in Chart?: No - copy requested Would patient like information on creating a medical advance directive?: No - Patient declined  Reviewed/Updated  Reviewed/Updated: Reviewed All (Medical, Surgical, Family, Medications, Allergies, Care Teams, Patient Goals)        Objective:    Today's Vitals   02/28/24 1419  Weight: 150 lb (68 kg)  Height: 5' 1 (1.549 m)   Body mass index is 28.34 kg/m.  Current Medications  (verified) Outpatient Encounter Medications as of 02/28/2024  Medication Sig   amiodarone  (PACERONE ) 100 MG tablet Take 1 tablet (100 mg total) by mouth daily.   apixaban  (ELIQUIS ) 5 MG TABS tablet Take 1 tablet (5 mg total) by mouth 2 (two) times daily.   dorzolamide-timolol (COSOPT) 22.3-6.8 MG/ML ophthalmic solution Place 1 drop into both eyes 2 (two) times daily.   empagliflozin  (JARDIANCE ) 25 MG TABS tablet Take 1 tablet (25 mg total) by mouth daily.   finasteride  (PROSCAR ) 5 MG tablet Take 1 tablet (5 mg total) by mouth daily.   glipiZIDE  (GLUCOTROL  XL) 2.5 MG 24 hr tablet Take 1 tablet (2.5 mg total) by mouth daily with breakfast.   glucose blood (GE100 BLOOD GLUCOSE TEST) test strip TEST TWICE DAILY   latanoprost (XALATAN) 0.005 % ophthalmic solution Place 1 drop into both eyes at bedtime.   Magnesium Hydroxide (MAGNESIA PO) Take by mouth daily.   metFORMIN  (GLUCOPHAGE -XR) 500 MG 24 hr tablet Take 2 tablets (1,000 mg total) by mouth 2 (two) times daily with a meal.   metoprolol  succinate (TOPROL  XL) 25 MG 24 hr tablet Take 1 tablet (25 mg total) by mouth daily.   Multiple Vitamin (MULTIVITAMIN) tablet Take 1 tablet by mouth daily.   Potassium (POTASSIMIN PO) Take by mouth daily.   rosuvastatin  (CRESTOR ) 10 MG tablet TAKE ONE TABLET EVERY DAY   vitamin E 200 UNIT capsule Take 200 Units by mouth daily.   No facility-administered encounter medications on file as of 02/28/2024.   Hearing/Vision screen Hearing Screening - Comments:: No issues Vision Screening - Comments:: Glasses, [facility], up to date Immunizations and Health Maintenance Health Maintenance  Topic Date Due   Hepatitis C Screening  Never done   Zoster Vaccines- Shingrix (1 of 2) 10/25/1974   Pneumococcal Vaccine: 50+ Years (1 of 2 - PCV) 07/18/2024 (Originally 10/25/1974)   Influenza Vaccine  12/15/2024 (Originally 11/04/2023)   OPHTHALMOLOGY EXAM  06/27/2024   HEMOGLOBIN A1C  08/26/2024   FOOT EXAM   11/21/2024   Mammogram  01/09/2025   Diabetic kidney evaluation - eGFR measurement  02/26/2025   Diabetic kidney evaluation - Urine ACR  02/26/2025   Medicare Annual Wellness (AWV)  02/27/2025   Colonoscopy  06/23/2028   Bone Density Scan  Completed   Meningococcal B Vaccine  Aged Out   DTaP/Tdap/Td  Discontinued   COVID-19 Vaccine  Discontinued        Assessment/Plan:  This is a routine wellness examination for April Solis.  Patient Care Team: Glendia Shad, MD as PCP - General (Internal Medicine) End, Lonni, MD as PCP - Cardiology (Cardiology) Geronimo Manuelita SAUNDERS, [facility] (Pharmacist) Cindie Ole DASEN, MD as  Consulting Physician (Cardiology) Onita Elspeth Sharper, DO as Consulting Physician (Gastroenterology)  I have personally reviewed and noted the following in the patient's chart:   Medical and social history Use of alcohol, tobacco or illicit drugs  Current medications and supplements including opioid prescriptions. Functional ability and status Nutritional status Physical activity Advanced directives List of other physicians Hospitalizations, surgeries, and ER visits in previous 12 months Vitals Screenings to include cognitive, depression, and falls Referrals and appointments  No orders of the defined types were placed in this encounter.  In addition, I have reviewed and discussed with patient certain preventive protocols, quality metrics, and best practice recommendations. A written personalized care plan for preventive services as well as general preventive health recommendations were provided to patient.   Angeline Fredericks, LPN   88/74/7974   Return in 1 year (on 02/27/2025).  After Visit Summary: (MyChart) Due to this being a telephonic visit, the after visit summary with patients personalized plan was offered to patient via MyChart   Nurse Notes: Patient declines vaccines and states that she is allergic to the flu vaccine.

## 2024-03-04 ENCOUNTER — Encounter: Payer: Self-pay | Admitting: Internal Medicine

## 2024-03-04 NOTE — Assessment & Plan Note (Signed)
 Post op - afib with RVR. Started on diltiazem . Did not convert. Changed to amiodarone  and heparin . Cardiology consulted. Started eliquis  01/21/24. Metoprolol  50mg  bid. Digoxin  stopped. Had f/u with Dr Cindie 02/15/24 - persistent intermittent afib. Recommended to continue amiodarone , but decreased dose to 100mg  q day - to see if helped with light headedness. Also changed metoprolol  tartrate to metoprolol  succinate 25mg  dialy. Recommended to continue eliquis . Stable.

## 2024-03-04 NOTE — Assessment & Plan Note (Signed)
 Continue on toprol  as directed. Blood pressure as outlined. Follow pressures. Follow metabolic panel.

## 2024-03-04 NOTE — Assessment & Plan Note (Signed)
 Persistent intermittent dizziness as outlined. Occurs when rolls over in bed. Reproducible on exam. Appears to be c/w vertigo. Given persistence, refer to ENT for evaluation. Discussed epley maneuvers.

## 2024-03-04 NOTE — Assessment & Plan Note (Signed)
 On crestor .  Low cholesterol diet and exercise.  Follow lipid panel.  Lab Results  Component Value Date   CHOL 91 02/27/2024   HDL 38.40 (L) 02/27/2024   LDLCALC 25 02/27/2024   LDLDIRECT 63.0 04/28/2022   TRIG 141.0 02/27/2024   CHOLHDL 2 02/27/2024

## 2024-03-04 NOTE — Assessment & Plan Note (Signed)
Recheck magnesium level today.  

## 2024-03-04 NOTE — Assessment & Plan Note (Signed)
Overall appears to be handling stress well.  Follow.  

## 2024-03-04 NOTE — Assessment & Plan Note (Signed)
 Low carb diet and exercise. Continue jardiance , metformin  and glipizide . No change in medication today. Follow met b and A1c.

## 2024-03-05 NOTE — Progress Notes (Signed)
  I connected with  April Solis on 02/28/24 by a audio enabled telemedicine application and verified that I am speaking with the correct person using two identifiers.  Patient Location: Home  Provider Location: Home Office  Persons Participating in Visit: Patient.  I discussed the limitations of evaluation and management by telemedicine. The patient expressed understanding and agreed to proceed.   Vital Signs: Because this visit was a virtual/telehealth visit, some criteria may be missing or patient reported. Any vitals not documented were not able to be obtained and vitals that have been documented are patient reported.

## 2024-03-06 NOTE — Telephone Encounter (Signed)
 Pt inquiring about ent referral

## 2024-03-09 NOTE — Progress Notes (Signed)
 April Solis                                          MRN: [id number]   03/09/2024   The [facility] Quality Team Specialist reviewed this patient medical record for the purposes of chart review for care gap closure. The following were reviewed: abstraction for care gap closure-kidney health evaluation for diabetes:eGFR  and uACR.    [facility]BCI Quality Team

## 2024-03-20 ENCOUNTER — Other Ambulatory Visit

## 2024-03-22 ENCOUNTER — Encounter: Admitting: Internal Medicine

## 2024-04-13 ENCOUNTER — Ambulatory Visit: Attending: Physician Assistant | Admitting: Physician Assistant

## 2024-04-13 ENCOUNTER — Encounter: Payer: Self-pay | Admitting: Physician Assistant

## 2024-04-13 VITALS — BP 100/60 | HR 62 | Ht 61.0 in | Wt 155.0 lb

## 2024-04-13 DIAGNOSIS — H8110 Benign paroxysmal vertigo, unspecified ear: Secondary | ICD-10-CM | POA: Diagnosis not present

## 2024-04-13 DIAGNOSIS — E785 Hyperlipidemia, unspecified: Secondary | ICD-10-CM

## 2024-04-13 DIAGNOSIS — I4819 Other persistent atrial fibrillation: Secondary | ICD-10-CM | POA: Diagnosis not present

## 2024-04-13 DIAGNOSIS — I10 Essential (primary) hypertension: Secondary | ICD-10-CM | POA: Diagnosis not present

## 2024-04-13 DIAGNOSIS — E1169 Type 2 diabetes mellitus with other specified complication: Secondary | ICD-10-CM

## 2024-04-13 DIAGNOSIS — Z01810 Encounter for preprocedural cardiovascular examination: Secondary | ICD-10-CM | POA: Diagnosis not present

## 2024-04-13 NOTE — Patient Instructions (Signed)
 Medication Instructions:  Your physician recommends the following medication changes.  STOP TAKING: Amiodarone  on 14 February   *If you need a refill on your cardiac medications before your next appointment, please call your pharmacy*  Lab Work: None ordered at this time   Follow-Up: At [facility], you and your health needs are our priority.  As part of our continuing mission to provide you with exceptional heart care, our providers are all part of one team.  This team includes your primary Cardiologist (physician) and Advanced Practice Providers or APPs (Physician Assistants and Nurse Practitioners) who all work together to provide you with the care you need, when you need it.  Your next appointment:   5 month(s)  Provider:   You may see Lonni Hanson, MD or Bernardino Bring, PA-C

## 2024-04-13 NOTE — Progress Notes (Signed)
 "  Cardiology Office Note    Date:  04/13/2024   ID:  Citlaly, Camplin September 22, 1955, MRN [id number]  PCP:  Glendia Shad, MD  Cardiologist:  Lonni Hanson, MD  Electrophysiologist:  None   Chief Complaint: Follow up  History of Present Illness:   April Solis is a 69 y.o. female with history of persistent A-fib, HTN, HLD, DM2, glaucoma, and environmental allergies presents for follow-up of A-fib.    Echo in 03/2021 showed an EF of 60 to 65%, no regional wall motion abnormalities, normal LV diastolic function parameters, normal RV systolic function and ventricular cavity size, mild mitral regurgitation, and an estimated right atrial pressure of 3 mmHg.  In the setting of a single episode of chest pain she underwent CTA in 05/2021 showed a calcium  score of 0 with no evidence of CAD, and no other significant incidental findings.  She was last seen in the office in 01/2023 and was doing well from a cardiac perspective reporting variable A-fib burden by Apple watch.  No changes were indicated at that time.  She was admitted to [facility] in 01/2024 with biliary colic status post cholecystectomy, complicated by A-fib with RVR with EP recommendation for amiodarone  load, with continuation of Lopressor , and Eliquis  and outpatient follow-up.  She followed up with EP in 02/2024 and was in sinus bradycardia with a rate of 56 bpm.  She reported room spinning sensation and lightheadedness, worse when she first gets out of bed in the morning.  She reported a recurrence of A-fib approximately 1 week prior that lasted for several minutes according to her Apple watch.  EP recommended a total of 3 months of amiodarone  followed by discontinuation.  They also reduced her dose of amiodarone  to 100 mg daily and transitioned her from Lopressor  to Toprol -XL 25 mg daily.  She comes in doing well from a cardiac perspective and is without symptoms of angina or cardiac decompensation.  No palpitations,  dizziness, presyncope, or syncope.  Vertigo resolved 2 days after undergoing Epley maneuver in late 03/2024.  No falls or symptoms concerning for bleeding.  No significant lower extremity swelling or progressive orthopnea.  Feels well from a cardiac perspective.  She will be undergoing screening colonoscopy in the spring 2026 and can achieve greater than 4 METs without cardiac limitation.   Labs independently reviewed: 02/2024 - Hgb 13.6, PLT 264, albumin 4.5, AST/ALT normal, magnesium 1.9, TC 91, TG 141, HDL 38, LDL 25, potassium 4.7, BUN 22, serum creatinine 0.78, A1c 5.9 01/2024 - TSH normal  Past Medical History:  Diagnosis Date   Achilles rupture, right    Contact dermatitis and eczema due to plant    Diabetes mellitus without complication (HCC)    Environmental allergies    Glaucoma    History of chicken pox    Hyperlipidemia    Hypertension    Osteopenia    Paroxysmal A-fib (HCC)     Past Surgical History:  Procedure Laterality Date   ACHILLES TENDON REPAIR Right    BREAST BIOPSY Right 1999   neg   CHOLECYSTECTOMY     CHOLECYSTECTOMY  01/18/2024   COLONOSCOPY WITH PROPOFOL  N/A 12/12/2017   Procedure: COLONOSCOPY WITH PROPOFOL ;  Surgeon: Viktoria Lamar DASEN, MD;  Location: [facility] ENDOSCOPY;  Service: Endoscopy;  Laterality: N/A;   COLONOSCOPY WITH PROPOFOL  N/A 06/24/2023   Procedure: COLONOSCOPY WITH PROPOFOL ;  Surgeon: Onita Elspeth Sharper, DO;  Location: [facility] ENDOSCOPY;  Service: Gastroenterology;  Laterality: N/A;  DM, 1ST CASE, PLEASE  TUBAL LIGATION      Current Medications: Active Medications[1]  Allergies:   Influenza vaccines, Rubbing alcohol [alcohol], and Other   Social History   Socioeconomic History   Marital status: Married    Spouse name: Not on file   Number of children: 3   Years of education: Not on file   Highest education level: Some college, no degree  Occupational History    Employer: [employer]  Tobacco Use   Smoking status: Never    Passive  exposure: Never   Smokeless tobacco: Never  Vaping Use   Vaping status: Never Used  Substance and Sexual Activity   Alcohol use: No    Alcohol/week: 0.0 standard drinks of alcohol   Drug use: No   Sexual activity: Not on file  Other Topics Concern   Not on file  Social History Narrative   Married   Social Drivers of Health   Tobacco Use: Low Risk (04/13/2024)   Patient History    Smoking Tobacco Use: Never    Smokeless Tobacco Use: Never    Passive Exposure: Never  Financial Resource Strain: Low Risk (02/27/2024)   Overall Financial Resource Strain (CARDIA)    Difficulty of Paying Living Expenses: Not hard at all  Food Insecurity: No Food Insecurity (02/27/2024)   Epic    Worried About Programme Researcher, Broadcasting/film/video in the Last Year: Never true    Ran Out of Food in the Last Year: Never true  Transportation Needs: No Transportation Needs (02/27/2024)   Epic    Lack of Transportation (Medical): No    Lack of Transportation (Non-Medical): No  Physical Activity: Inactive (02/27/2024)   Exercise Vital Sign    Days of Exercise per Week: 0 days    Minutes of Exercise per Session: Not on file  Stress: No Stress Concern Present (02/27/2024)   [employer] of Occupational Health - Occupational Stress Questionnaire    Feeling of Stress: Only a little  Social Connections: Socially Integrated (02/27/2024)   Social Connection and Isolation Panel    Frequency of Communication with Friends and Family: More than three times a week    Frequency of Social Gatherings with Friends and Family: More than three times a week    Attends Religious Services: More than 4 times per year    Active Member of Clubs or Organizations: Yes    Attends Banker Meetings: More than 4 times per year    Marital Status: Married  Depression (PHQ2-9): Low Risk (02/28/2024)   Depression (PHQ2-9)    PHQ-2 Score: 1  Alcohol Screen: Low Risk (02/28/2024)   Alcohol Screen    Last Alcohol Screening Score  (AUDIT): 0  Housing: Low Risk (02/27/2024)   Epic    Unable to Pay for Housing in the Last Year: No    Number of Times Moved in the Last Year: 0    Homeless in the Last Year: No  Utilities: Not At Risk (02/28/2024)   Epic    Threatened with loss of utilities: No  Health Literacy: Adequate Health Literacy (02/28/2024)   B1300 Health Literacy    Frequency of need for help with medical instructions: Never     Family History:  The patient's family history includes Alzheimer's disease in her mother; Arrhythmia in her son; COPD in her sister; Colon polyps in her mother; Diabetes in her father, sister, and sister; Glaucoma in her sister; Heart attack (age of onset: 26) in her father; Hypertension in her father, sister, and sister.  There is no history of Breast cancer or Colon cancer.  ROS:   12-point review of systems is negative unless otherwise noted in the HPI.   EKGs/Labs/Other Studies Reviewed:    Studies reviewed were summarized above. The additional studies were reviewed today:  Coronary CTA 05/28/2021: FINDINGS: Aorta: Normal size. Minimal aortic root calcifications. No dissection.   Aortic Valve:  Trileaflet.  No calcifications.   Coronary Arteries:  Normal coronary origin.  Right dominance.   RCA is a dominant artery that gives rise to PDA and PLA. There is no plaque.   Left main gives rise to LAD and LCX arteries. There is no LM disease.   LAD has no plaque.   LCX is a non-dominant artery that gives rise one OM1 branch. There is no plaque.   Other findings:   Normal pulmonary vein drainage into the left atrium.   Normal left atrial appendage without a thrombus.   Normal size of the pulmonary artery.   IMPRESSION: 1. Normal coronary calcium  score of 0. Patient is low risk for coronary events. 2.  Normal coronary origin with right dominance. 3.  No evidence of CAD. 4.  CAD-RADS 0.  Consider non-atherosclerotic causes of chest pain. __________   2D echo  03/12/2021: 1. Left ventricular ejection fraction, by estimation, is 60 to 65%. The  left ventricle has normal function. The left ventricle has no regional  wall motion abnormalities. Left ventricular diastolic parameters were  normal. The average left ventricular  global longitudinal strain is -20.5 %. The global longitudinal strain is  normal.   2. Right ventricular systolic function is normal. The right ventricular  size is normal. Tricuspid regurgitation signal is inadequate for assessing  PA pressure.   3. The mitral valve is normal in structure. Mild mitral valve  regurgitation. No evidence of mitral stenosis.   4. The aortic valve is normal in structure. Aortic valve regurgitation is  not visualized. No aortic stenosis is present.   5. The inferior vena cava is normal in size with greater than 50%  respiratory variability, suggesting right atrial pressure of 3 mmHg.    EKG:  EKG is ordered today.  The EKG ordered today demonstrates NSR, 62 bpm, possible left atrial enlargement, no acute ST-T changes  Recent Labs: 01/23/2024: TSH 1.711 02/27/2024: ALT 28; BUN 22; Creatinine, Ser 0.78; Hemoglobin 13.6; Magnesium 1.9; Platelets 264.0; Potassium 4.7; Sodium 140  Recent Lipid Panel    Component Value Date/Time   CHOL 91 02/27/2024 1017   CHOL 150 12/26/2012 0650   TRIG 141.0 02/27/2024 1017   TRIG 132 12/26/2012 0650   HDL 38.40 (L) 02/27/2024 1017   HDL 40 12/26/2012 0650   CHOLHDL 2 02/27/2024 1017   VLDL 28.2 02/27/2024 1017   VLDL 26 12/26/2012 0650   LDLCALC 25 02/27/2024 1017   LDLCALC 84 12/26/2012 0650   LDLDIRECT 63.0 04/28/2022 0909    PHYSICAL EXAM:    VS:  BP 100/60 (BP Location: Left Arm, Patient Position: Sitting, Cuff Size: Normal)   Pulse 62 Comment: 70 oximeter  Ht 5' 1 (1.549 m)   Wt 155 lb (70.3 kg)   LMP 02/26/2008   SpO2 97%   BMI 29.29 kg/m   BMI: Body mass index is 29.29 kg/m.  Physical Exam Vitals reviewed.  Constitutional:       Appearance: She is well-developed.  HENT:     Head: Normocephalic and atraumatic.  Eyes:     General:  Right eye: No discharge.        Left eye: No discharge.  Cardiovascular:     Rate and Rhythm: Normal rate and regular rhythm.     Pulses:          Dorsalis pedis pulses are 2+ on the right side and 2+ on the left side.       Posterior tibial pulses are 2+ on the right side and 2+ on the left side.     Heart sounds: Normal heart sounds, S1 normal and S2 normal. Heart sounds not distant. No midsystolic click and no opening snap. No murmur heard.    No friction rub.  Pulmonary:     Effort: Pulmonary effort is normal. No respiratory distress.     Breath sounds: Normal breath sounds. No decreased breath sounds, wheezing, rhonchi or rales.  Musculoskeletal:     Cervical back: Normal range of motion.     Right lower leg: No edema.     Left lower leg: No edema.  Skin:    General: Skin is warm and dry.     Nails: There is no clubbing.  Neurological:     Mental Status: She is alert and oriented to person, place, and time.  Psychiatric:        Speech: Speech normal.        Behavior: Behavior normal.        Thought Content: Thought content normal.        Judgment: Judgment normal.     Wt Readings from Last 3 Encounters:  04/13/24 155 lb (70.3 kg)  02/28/24 150 lb (68 kg)  02/27/24 150 lb 6.4 oz (68.2 kg)     ASSESSMENT & PLAN:   Persistent Afib: Maintaining sinus rhythm on amiodarone  100 mg daily and prophylaxis 25 mg daily.  As noted by EP, she may discontinue amiodarone  after 05/17/2024 (we have set a discontinue date of 05/19/2024).  Given she is maintaining sinus rhythm with plans to discontinue amiodarone  as above, go ahead and cancel EP APP follow-up.  CHA2DS2-VASc at least 4 (HTN, age x 1, DM, sex category).  Recent labs stable.  HTN: Blood pressure is well-controlled in the office today on Toprol -XL 25 mg.  HLD: LDL 25 in 02/2024.  Remains on rosuvastatin  10 mg.  DM2:  A1c 5.9 in 02/2024.  Managed by PCP.  BPPV: Resolved following Epley maneuver.  Preprocedure cardiac risk stratification: She will be undergoing screening colonoscopy in the spring 2026.  She can achieve greater than 4 METs without cardiac limitation.  Per [facility]CRI, she is low risk for noncardiac procedure and may proceed without further cardiac testing.  Apixaban  can be held for 2 days prior to colonoscopy with recommendation to resume as soon as safely possible post-procedure at the discretion of GI MD.     Disposition: F/u with Dr. Mady or an APP in 5-6 months.   Medication Adjustments/Labs and Tests Ordered: Current medicines are reviewed at length with the patient today.  Concerns regarding medicines are outlined above. Medication changes, Labs and Tests ordered today are summarized above and listed in the Patient Instructions accessible in Encounters.   Signed, Bernardino Bring, PA-C 04/13/2024 4:09 PM     [facility] [place] [address] [place], [place] [address] [phone]     [1]  Current Meds  Medication Sig   apixaban  (ELIQUIS ) 5 MG TABS tablet Take 1 tablet (5 mg total) by mouth 2 (two) times daily.   dorzolamide-timolol (  COSOPT) 22.3-6.8 MG/ML ophthalmic solution Place 1 drop into both eyes 2 (two) times daily.   empagliflozin  (JARDIANCE ) 25 MG TABS tablet Take 1 tablet (25 mg total) by mouth daily.   finasteride  (PROSCAR ) 5 MG tablet Take 1 tablet (5 mg total) by mouth daily.   glipiZIDE  (GLUCOTROL  XL) 2.5 MG 24 hr tablet Take 1 tablet (2.5 mg total) by mouth daily with breakfast.   glucose blood (GE100 BLOOD GLUCOSE TEST) test strip TEST TWICE DAILY   latanoprost (XALATAN) 0.005 % ophthalmic solution Place 1 drop into both eyes at bedtime.   Magnesium Hydroxide (MAGNESIA PO) Take by mouth daily.   metFORMIN  (GLUCOPHAGE -XR) 500 MG 24 hr tablet Take 2 tablets (1,000 mg total) by mouth 2 (two) times daily with a meal.   metoprolol  succinate (TOPROL   XL) 25 MG 24 hr tablet Take 1 tablet (25 mg total) by mouth daily.   Multiple Vitamin (MULTIVITAMIN) tablet Take 1 tablet by mouth daily.   Potassium (POTASSIMIN PO) Take by mouth daily.   rosuvastatin  (CRESTOR ) 10 MG tablet TAKE ONE TABLET EVERY DAY   vitamin E 200 UNIT capsule Take 200 Units by mouth daily.   [DISCONTINUED] amiodarone  (PACERONE ) 100 MG tablet Take 1 tablet (100 mg total) by mouth daily.   "

## 2024-04-19 ENCOUNTER — Ambulatory Visit: Admitting: Physician Assistant

## 2024-05-23 ENCOUNTER — Ambulatory Visit: Admitting: Cardiology

## 2024-06-22 ENCOUNTER — Other Ambulatory Visit

## 2024-06-27 ENCOUNTER — Encounter: Admitting: Internal Medicine

## 2024-08-07 ENCOUNTER — Ambulatory Visit: Admitting: Dermatology

## 2024-09-13 ENCOUNTER — Ambulatory Visit: Admitting: Physician Assistant

## 2025-03-05 ENCOUNTER — Ambulatory Visit
